# Patient Record
Sex: Male | Born: 1980 | Race: White | Hispanic: No | Marital: Married | Smoking: Former smoker
Health system: Southern US, Community
[De-identification: ages and names within clinical notes are randomized; demographics above are authoritative.]

## PROBLEM LIST (undated history)

## (undated) DIAGNOSIS — F32A Depression, unspecified: Secondary | ICD-10-CM

## (undated) DIAGNOSIS — F329 Major depressive disorder, single episode, unspecified: Secondary | ICD-10-CM

## (undated) DIAGNOSIS — F1010 Alcohol abuse, uncomplicated: Secondary | ICD-10-CM

## (undated) DIAGNOSIS — F319 Bipolar disorder, unspecified: Secondary | ICD-10-CM

## (undated) DIAGNOSIS — F419 Anxiety disorder, unspecified: Secondary | ICD-10-CM

---

## 2007-10-27 ENCOUNTER — Emergency Department (HOSPITAL_COMMUNITY): Admission: EM | Admit: 2007-10-27 | Discharge: 2007-10-27 | Payer: Self-pay | Admitting: Family Medicine

## 2009-11-11 ENCOUNTER — Emergency Department (HOSPITAL_COMMUNITY): Admission: EM | Admit: 2009-11-11 | Discharge: 2009-11-11 | Payer: Self-pay | Admitting: Family Medicine

## 2010-12-23 ENCOUNTER — Inpatient Hospital Stay (INDEPENDENT_AMBULATORY_CARE_PROVIDER_SITE_OTHER)
Admission: RE | Admit: 2010-12-23 | Discharge: 2010-12-23 | Disposition: A | Discharge status: Home / Self Care | Payer: Self-pay | Source: Ambulatory Visit | Attending: Family Medicine | Admitting: Family Medicine

## 2010-12-23 DIAGNOSIS — J309 Allergic rhinitis, unspecified: Secondary | ICD-10-CM

## 2010-12-23 DIAGNOSIS — J45909 Unspecified asthma, uncomplicated: Secondary | ICD-10-CM

## 2012-10-31 ENCOUNTER — Emergency Department (INDEPENDENT_AMBULATORY_CARE_PROVIDER_SITE_OTHER)
Admission: EM | Admit: 2012-10-31 | Discharge: 2012-10-31 | Disposition: A | Discharge status: Home / Self Care | Payer: Commercial Managed Care - PPO | Source: Home / Self Care | Attending: Emergency Medicine | Admitting: Emergency Medicine

## 2012-10-31 ENCOUNTER — Encounter (HOSPITAL_COMMUNITY): Payer: Self-pay | Admitting: Emergency Medicine

## 2012-10-31 DIAGNOSIS — Z23 Encounter for immunization: Secondary | ICD-10-CM

## 2012-10-31 DIAGNOSIS — S81811A Laceration without foreign body, right lower leg, initial encounter: Secondary | ICD-10-CM

## 2012-10-31 DIAGNOSIS — S81009A Unspecified open wound, unspecified knee, initial encounter: Secondary | ICD-10-CM

## 2012-10-31 HISTORY — DX: Anxiety disorder, unspecified: F41.9

## 2012-10-31 MED ORDER — SULFAMETHOXAZOLE-TRIMETHOPRIM 800-160 MG PO TABS: 1.0000 | ORAL_TABLET | Freq: Two times a day (BID) | ORAL | Status: DC

## 2012-10-31 MED ORDER — TETANUS-DIPHTH-ACELL PERTUSSIS 5-2.5-18.5 LF-MCG/0.5 IM SUSP
INTRAMUSCULAR | Status: AC
  Filled 2012-10-31: qty 0.5

## 2012-10-31 MED ORDER — IBUPROFEN 800 MG PO TABS: 800.0000 mg | ORAL_TABLET | Freq: Three times a day (TID) | ORAL | Status: DC

## 2012-10-31 MED ORDER — TETANUS-DIPHTH-ACELL PERTUSSIS 5-2.5-18.5 LF-MCG/0.5 IM SUSP
0.5000 mL | Freq: Once | INTRAMUSCULAR | Status: AC
  Administered 2012-10-31: 0.5 mL via INTRAMUSCULAR

## 2012-10-31 NOTE — ED Provider Notes (Signed)
History     CSN: [id number]  Arrival date & time 10/31/12  1015   First MD Initiated Contact with Patient 10/31/12 1054      Chief Complaint  Patient presents with  . Laceration    (Consider location/radiation/quality/duration/timing/severity/associated sxs/prior treatment) Patient is a 32 y.o. male presenting with skin laceration. The history is provided by the patient. No language interpreter was used.  Laceration Location:  Leg Leg laceration location:  L lower leg Length (cm):  3 Depth:  Through underlying tissue Quality: straight   Bleeding: controlled   Time since incident:  13 hours Laceration mechanism:  Broken glass Pain details:    Quality:  Sharp   Severity:  Moderate Foreign body present:  No foreign bodies Relieved by:  Nothing Ineffective treatments:  None tried Pt complains of cutting his leg with a piece of glass  Past Medical History  Diagnosis Date  . Anxiety     History reviewed. No pertinent past surgical history.  No family history on file.  History  Substance Use Topics  . Smoking status: Never Smoker   . Smokeless tobacco: Not on file  . Alcohol Use: Yes      Review of Systems  Skin: Positive for wound.  All other systems reviewed and are negative.    Allergies  Shellfish allergy  Home Medications   Current Outpatient Rx  Name  Route  Sig  Dispense  Refill  . ALPRAZolam (XANAX) 1 MG tablet   Oral   Take 1 mg by mouth as needed for anxiety.         Marland Kitchen PARoxetine (PAXIL) 20 MG tablet   Oral   Take 20 mg by mouth every morning.           BP 151/82  Pulse 82  Temp(Src) 98.1 F (36.7 C) (Oral)  SpO2 100%  Physical Exam  Nursing note and vitals reviewed. Constitutional: He is oriented to person, place, and time. He appears well-developed and well-nourished.  HENT:  Head: Normocephalic.  Eyes: Pupils are equal, round, and reactive to light.  Neck: Normal range of motion.  Cardiovascular: Normal rate.    Pulmonary/Chest: Effort normal.  Musculoskeletal: He exhibits tenderness.  Neurological: He is alert and oriented to person, place, and time.  Skin: Skin is warm.  3cm laceration right lower leg  Psychiatric: He has a normal mood and affect.    ED Course  LACERATION REPAIR Date/Time: 10/31/2012 11:00 AM Performed by: Elson Areas Authorized by: Leslee Home C Consent: Verbal consent obtained. Risks and benefits: risks, benefits and alternatives were discussed Consent given by: patient Patient identity confirmed: verbally with patient Body area: lower extremity Location details: right lower leg Laceration length: 3 cm Foreign bodies: glass Anesthesia: local infiltration Preparation: Patient was prepped and draped in the usual sterile fashion. Irrigation solution: saline Amount of cleaning: standard Skin closure: 3-0 Prolene Number of sutures: 6 Technique: simple Approximation: loose Approximation difficulty: simple Patient tolerance: Patient tolerated the procedure well with no immediate complications.   (including critical care time)  Labs Reviewed - No data to display No results found.   No diagnosis found.    MDM    Suture removal in 10 days  Bactrim ds,  ibuprofen     Lonia Skinner [place], [place] 10/31/12 1101

## 2012-10-31 NOTE — ED Notes (Signed)
Reports cut to right lower leg last night.  Cut with glass last night while taking trash out.  Bleeding controlled.

## 2012-10-31 NOTE — ED Provider Notes (Signed)
Medical screening examination/treatment/procedure(s) were performed by non-physician practitioner and as supervising physician I was immediately available for consultation/collaboration.  David Keller, M.D.  David C Keller, MD 10/31/12 2136 

## 2012-10-31 NOTE — ED Notes (Signed)
Patient name is incorrect, clarified correction with registration.

## 2012-11-10 ENCOUNTER — Encounter (HOSPITAL_COMMUNITY): Payer: Self-pay | Admitting: Emergency Medicine

## 2012-11-10 ENCOUNTER — Emergency Department (INDEPENDENT_AMBULATORY_CARE_PROVIDER_SITE_OTHER)
Admission: EM | Admit: 2012-11-10 | Discharge: 2012-11-10 | Disposition: A | Discharge status: Home / Self Care | Payer: Commercial Managed Care - PPO | Source: Home / Self Care

## 2012-11-10 DIAGNOSIS — Z4802 Encounter for removal of sutures: Secondary | ICD-10-CM

## 2012-11-10 NOTE — ED Notes (Signed)
Onalee Hua, np reported patient was a little lightheaded.  Onalee Hua np laid patient flat.  Gave patient coke and ice to drink, allowed patient to rest for a few minutes.  Patient then reported feeling much better.  Patient finished coke, rechecked vitals

## 2012-11-10 NOTE — ED Provider Notes (Signed)
Medical screening examination/treatment/procedure(s) were performed by non-physician practitioner and as supervising physician I was immediately available for consultation/collaboration.  Leslee Home, M.D.  Reuben Likes, MD 11/10/12 [phone]

## 2012-11-10 NOTE — ED Provider Notes (Signed)
History     CSN: [id number]  Arrival date & time 11/10/12  1013   None     Chief Complaint  Patient presents with  . Suture / Staple Removal    (Consider location/radiation/quality/duration/timing/severity/associated sxs/prior treatment) HPI Comments: 32 year old male received a laceration to the right lower lateral leg 10 days ago. He presented to the urgent care center and the wound was treated and sutured. He is here for suture removal. He has no complaints.   Past Medical History  Diagnosis Date  . Anxiety     History reviewed. No pertinent past surgical history.  No family history on file.  History  Substance Use Topics  . Smoking status: Never Smoker   . Smokeless tobacco: Not on file  . Alcohol Use: Yes      Review of Systems  All other systems reviewed and are negative.    Allergies  Shellfish allergy  Home Medications   Current Outpatient Rx  Name  Route  Sig  Dispense  Refill  . ALPRAZolam (XANAX) 1 MG tablet   Oral   Take 1 mg by mouth as needed for anxiety.         Marland Kitchen ibuprofen (ADVIL,MOTRIN) 800 MG tablet   Oral   Take 1 tablet (800 mg total) by mouth 3 (three) times daily.   21 tablet   0   . PARoxetine (PAXIL) 20 MG tablet   Oral   Take 20 mg by mouth every morning.         . sulfamethoxazole-trimethoprim (SEPTRA DS) 800-160 MG per tablet   Oral   Take 1 tablet by mouth 2 (two) times daily.   28 tablet   0     BP 135/94  Pulse 65  Temp(Src) 98.1 F (36.7 C) (Oral)  Resp 18  SpO2 100%  Physical Exam  Constitutional: He is oriented to person, place, and time. He appears well-developed and well-nourished. No distress.  Neurological: He is alert and oriented to person, place, and time. He exhibits normal muscle tone.  Skin: Skin is warm and dry. No erythema.  The sutured wound is healing quite nicely. Edges are well approximated and there are no signs of infection. No swelling, redness or drainage.  Psychiatric: He has a  normal mood and affect.    ED Course  Procedures (including critical care time)  Labs Reviewed - No data to display No results found.   1. Visit for suture removal       MDM  All sutures were removed. Instructions for care after suture removal given. Do not apply ointments. For any problems or new symptoms develop may return.   Hayden Rasmussen, NP 11/10/12 1056

## 2012-11-10 NOTE — ED Notes (Signed)
Suture removal , right lower leg.  Sutures placed by ucc

## 2014-05-20 ENCOUNTER — Encounter (HOSPITAL_COMMUNITY): Payer: Self-pay | Admitting: Emergency Medicine

## 2014-05-20 ENCOUNTER — Inpatient Hospital Stay (HOSPITAL_COMMUNITY): Payer: Commercial Managed Care - PPO

## 2014-05-20 ENCOUNTER — Inpatient Hospital Stay (HOSPITAL_COMMUNITY)
Admission: EM | Admit: 2014-05-20 | Discharge: 2014-05-23 | DRG: 897 | Disposition: A | Discharge status: Home / Self Care | Payer: Commercial Managed Care - PPO | Attending: Internal Medicine | Admitting: Internal Medicine

## 2014-05-20 DIAGNOSIS — E872 Acidosis, unspecified: Secondary | ICD-10-CM | POA: Diagnosis not present

## 2014-05-20 DIAGNOSIS — R7989 Other specified abnormal findings of blood chemistry: Secondary | ICD-10-CM | POA: Diagnosis present

## 2014-05-20 DIAGNOSIS — F10239 Alcohol dependence with withdrawal, unspecified: Principal | ICD-10-CM | POA: Diagnosis present

## 2014-05-20 DIAGNOSIS — F319 Bipolar disorder, unspecified: Secondary | ICD-10-CM | POA: Diagnosis present

## 2014-05-20 DIAGNOSIS — R1115 Cyclical vomiting syndrome unrelated to migraine: Secondary | ICD-10-CM | POA: Diagnosis present

## 2014-05-20 DIAGNOSIS — D61818 Other pancytopenia: Secondary | ICD-10-CM

## 2014-05-20 DIAGNOSIS — F1020 Alcohol dependence, uncomplicated: Secondary | ICD-10-CM | POA: Diagnosis present

## 2014-05-20 DIAGNOSIS — F10939 Alcohol use, unspecified with withdrawal, unspecified: Secondary | ICD-10-CM | POA: Diagnosis present

## 2014-05-20 DIAGNOSIS — E876 Hypokalemia: Secondary | ICD-10-CM | POA: Diagnosis present

## 2014-05-20 DIAGNOSIS — D45 Polycythemia vera: Secondary | ICD-10-CM | POA: Diagnosis present

## 2014-05-20 DIAGNOSIS — K219 Gastro-esophageal reflux disease without esophagitis: Secondary | ICD-10-CM | POA: Diagnosis present

## 2014-05-20 DIAGNOSIS — E861 Hypovolemia: Secondary | ICD-10-CM | POA: Diagnosis present

## 2014-05-20 DIAGNOSIS — I498 Other specified cardiac arrhythmias: Secondary | ICD-10-CM | POA: Diagnosis present

## 2014-05-20 DIAGNOSIS — R197 Diarrhea, unspecified: Secondary | ICD-10-CM | POA: Diagnosis present

## 2014-05-20 DIAGNOSIS — E8729 Other acidosis: Secondary | ICD-10-CM | POA: Diagnosis present

## 2014-05-20 DIAGNOSIS — F10230 Alcohol dependence with withdrawal, uncomplicated: Secondary | ICD-10-CM

## 2014-05-20 DIAGNOSIS — F10930 Alcohol use, unspecified with withdrawal, uncomplicated: Secondary | ICD-10-CM

## 2014-05-20 DIAGNOSIS — E860 Dehydration: Secondary | ICD-10-CM | POA: Diagnosis present

## 2014-05-20 DIAGNOSIS — F1010 Alcohol abuse, uncomplicated: Secondary | ICD-10-CM | POA: Diagnosis present

## 2014-05-20 DIAGNOSIS — R112 Nausea with vomiting, unspecified: Secondary | ICD-10-CM | POA: Diagnosis present

## 2014-05-20 HISTORY — DX: Bipolar disorder, unspecified: F31.9

## 2014-05-20 HISTORY — DX: Alcohol abuse, uncomplicated: F10.10

## 2014-05-20 LAB — COMPREHENSIVE METABOLIC PANEL
ALK PHOS: 57 U/L (ref 39–117)
ALT: 68 U/L — ABNORMAL HIGH (ref 0–53)
ANION GAP: 37 — AB (ref 5–15)
AST: 115 U/L — AB (ref 0–37)
Albumin: 4.6 g/dL (ref 3.5–5.2)
BILIRUBIN TOTAL: 1.5 mg/dL — AB (ref 0.3–1.2)
BUN: 14 mg/dL (ref 6–23)
CHLORIDE: 92 meq/L — AB (ref 96–112)
CO2: 8 meq/L — AB (ref 19–32)
CREATININE: 1 mg/dL (ref 0.50–1.35)
Calcium: 8.5 mg/dL (ref 8.4–10.5)
GFR calc Af Amer: >90 mL/min (ref >90)
Glucose, Bld: 113 mg/dL — ABNORMAL HIGH (ref 70–99)
Potassium: 5.1 mEq/L (ref 3.7–5.3)
Sodium: 137 mEq/L (ref 137–147)
Total Protein: 7.8 g/dL (ref 6.0–8.3)

## 2014-05-20 LAB — CBC WITH DIFFERENTIAL/PLATELET
BASOS ABS: 0 10*3/uL (ref 0.0–0.1)
BASOS PCT: 0 % (ref 0–1)
EOS PCT: 0 % (ref 0–5)
Eosinophils Absolute: 0 10*3/uL (ref 0.0–0.7)
HCT: 49.1 % (ref 39.0–52.0)
Hemoglobin: 17.5 g/dL — ABNORMAL HIGH (ref 13.0–17.0)
LYMPHS PCT: 6 % — AB (ref 12–46)
Lymphs Abs: 0.5 10*3/uL — ABNORMAL LOW (ref 0.7–4.0)
MCH: 32.6 pg (ref 26.0–34.0)
MCHC: 35.6 g/dL (ref 30.0–36.0)
MCV: 91.6 fL (ref 78.0–100.0)
Monocytes Absolute: 0.5 10*3/uL (ref 0.1–1.0)
Monocytes Relative: 6 % (ref 3–12)
Neutro Abs: 8.3 10*3/uL — ABNORMAL HIGH (ref 1.7–7.7)
Neutrophils Relative %: 88 % — ABNORMAL HIGH (ref 43–77)
PLATELETS: 240 10*3/uL (ref 150–400)
RBC: 5.36 MIL/uL (ref 4.22–5.81)
RDW: 13.1 % (ref 11.5–15.5)
WBC: 9.3 10*3/uL (ref 4.0–10.5)

## 2014-05-20 LAB — I-STAT ARTERIAL BLOOD GAS, ED
Acid-base deficit: 17 mmol/L — ABNORMAL HIGH (ref 0.0–2.0)
BICARBONATE: 9.4 meq/L — AB (ref 20.0–24.0)
O2 Saturation: 95 %
PCO2 ART: 24 mmHg — AB (ref 35.0–45.0)
PH ART: 7.2 — AB (ref 7.350–7.450)
PO2 ART: 93 mmHg (ref 80.0–100.0)
TCO2: 10 mmol/L (ref 0–100)

## 2014-05-20 LAB — ETHANOL: ALCOHOL ETHYL (B): 178 mg/dL — AB (ref 0–11)

## 2014-05-20 LAB — AMMONIA: Ammonia: 29 umol/L (ref 11–60)

## 2014-05-20 LAB — RAPID URINE DRUG SCREEN, HOSP PERFORMED
AMPHETAMINES: NOT DETECTED
BENZODIAZEPINES: NOT DETECTED
Barbiturates: NOT DETECTED
COCAINE: NOT DETECTED
OPIATES: NOT DETECTED
TETRAHYDROCANNABINOL: NOT DETECTED

## 2014-05-20 LAB — LIPASE, BLOOD: Lipase: 67 U/L — ABNORMAL HIGH (ref 11–59)

## 2014-05-20 LAB — LACTIC ACID, PLASMA: Lactic Acid, Venous: 7.7 mmol/L — ABNORMAL HIGH (ref 0.5–2.2)

## 2014-05-20 LAB — KETONES, QUALITATIVE: Acetone, Bld: NEGATIVE

## 2014-05-20 LAB — SALICYLATE LEVEL: Salicylate Lvl: <2 mg/dL — ABNORMAL LOW (ref 2.8–20.0)

## 2014-05-20 LAB — MRSA PCR SCREENING: MRSA BY PCR: NEGATIVE

## 2014-05-20 LAB — ACETAMINOPHEN LEVEL: Acetaminophen (Tylenol), Serum: <15 ug/mL (ref 10–30)

## 2014-05-20 MED ORDER — LORAZEPAM 2 MG/ML IJ SOLN
2.0000 mg | INTRAMUSCULAR | Status: DC | PRN
  Administered 2014-05-20 (×2): 2 mg via INTRAVENOUS
  Administered 2014-05-20: 3 mg via INTRAVENOUS
  Administered 2014-05-20: 2 mg via INTRAVENOUS
  Administered 2014-05-21: 3 mg via INTRAVENOUS
  Filled 2014-05-20 (×2): qty 1
  Filled 2014-05-20: qty 2
  Filled 2014-05-20 (×2): qty 1
  Filled 2014-05-20: qty 2

## 2014-05-20 MED ORDER — VITAMIN B-1 100 MG PO TABS
100.0000 mg | ORAL_TABLET | Freq: Every day | ORAL | Status: DC
  Administered 2014-05-21 – 2014-05-23 (×3): 100 mg via ORAL
  Filled 2014-05-20 (×3): qty 1

## 2014-05-20 MED ORDER — PROMETHAZINE HCL 25 MG/ML IJ SOLN
25.0000 mg | Freq: Once | INTRAMUSCULAR | Status: AC
  Administered 2014-05-20: 25 mg via INTRAVENOUS
  Filled 2014-05-20: qty 1

## 2014-05-20 MED ORDER — ALUM & MAG HYDROXIDE-SIMETH 200-200-20 MG/5ML PO SUSP: 30.0000 mL | Freq: Four times a day (QID) | ORAL | Status: DC | PRN

## 2014-05-20 MED ORDER — SODIUM CHLORIDE 0.9 % IV BOLUS (SEPSIS)
1000.0000 mL | Freq: Once | INTRAVENOUS | Status: AC
  Administered 2014-05-20: 1000 mL via INTRAVENOUS

## 2014-05-20 MED ORDER — SODIUM CHLORIDE 0.9 % IV SOLN
INTRAVENOUS | Status: DC
  Administered 2014-05-20 – 2014-05-22 (×5): via INTRAVENOUS

## 2014-05-20 MED ORDER — FOLIC ACID 1 MG PO TABS
1.0000 mg | ORAL_TABLET | Freq: Every day | ORAL | Status: DC
  Administered 2014-05-21 – 2014-05-23 (×3): 1 mg via ORAL
  Filled 2014-05-20 (×3): qty 1

## 2014-05-20 MED ORDER — THIAMINE HCL 100 MG/ML IJ SOLN
Freq: Once | INTRAVENOUS | Status: AC
  Administered 2014-05-20: 15:00:00 via INTRAVENOUS
  Filled 2014-05-20: qty 1000

## 2014-05-20 MED ORDER — ONDANSETRON HCL 4 MG/2ML IJ SOLN
4.0000 mg | Freq: Once | INTRAMUSCULAR | Status: AC
  Administered 2014-05-20: 4 mg via INTRAVENOUS
  Filled 2014-05-20: qty 2

## 2014-05-20 MED ORDER — HEPARIN SODIUM (PORCINE) 5000 UNIT/ML IJ SOLN
5000.0000 [IU] | Freq: Three times a day (TID) | INTRAMUSCULAR | Status: DC
  Administered 2014-05-20 – 2014-05-23 (×8): 5000 [IU] via SUBCUTANEOUS
  Filled 2014-05-20 (×10): qty 1

## 2014-05-20 MED ORDER — ONDANSETRON HCL 4 MG/2ML IJ SOLN
4.0000 mg | Freq: Four times a day (QID) | INTRAMUSCULAR | Status: DC
  Administered 2014-05-20 – 2014-05-23 (×11): 4 mg via INTRAVENOUS
  Filled 2014-05-20 (×11): qty 2

## 2014-05-20 MED ORDER — ADULT MULTIVITAMIN W/MINERALS CH
1.0000 | ORAL_TABLET | Freq: Every day | ORAL | Status: DC
  Administered 2014-05-21 – 2014-05-23 (×3): 1 via ORAL
  Filled 2014-05-20 (×3): qty 1

## 2014-05-20 MED ORDER — HALOPERIDOL LACTATE 5 MG/ML IJ SOLN
5.0000 mg | Freq: Once | INTRAMUSCULAR | Status: AC
  Administered 2014-05-20: 5 mg via INTRAVENOUS

## 2014-05-20 MED ORDER — BISACODYL 10 MG RE SUPP: 10.0000 mg | Freq: Every day | RECTAL | Status: DC | PRN

## 2014-05-20 MED ORDER — SODIUM CHLORIDE 0.9 % IV SOLN: INTRAVENOUS | Status: AC

## 2014-05-20 MED ORDER — PAROXETINE HCL 20 MG PO TABS
20.0000 mg | ORAL_TABLET | Freq: Every day | ORAL | Status: DC
  Administered 2014-05-21 – 2014-05-23 (×3): 20 mg via ORAL
  Filled 2014-05-20 (×4): qty 1

## 2014-05-20 MED ORDER — HALOPERIDOL LACTATE 5 MG/ML IJ SOLN
INTRAMUSCULAR | Status: AC
  Administered 2014-05-20: 5 mg via INTRAVENOUS
  Filled 2014-05-20: qty 1

## 2014-05-20 MED ORDER — LORAZEPAM 2 MG/ML IJ SOLN
1.0000 mg | Freq: Once | INTRAMUSCULAR | Status: AC
Start: 2014-05-20 — End: 2014-05-20
  Administered 2014-05-20: 1 mg via INTRAVENOUS

## 2014-05-20 MED ORDER — MORPHINE SULFATE 2 MG/ML IJ SOLN: 2.0000 mg | INTRAMUSCULAR | Status: DC | PRN

## 2014-05-20 MED ORDER — PROMETHAZINE HCL 25 MG/ML IJ SOLN
12.5000 mg | Freq: Four times a day (QID) | INTRAMUSCULAR | Status: DC | PRN
  Administered 2014-05-20 (×2): 12.5 mg via INTRAVENOUS
  Filled 2014-05-20 (×3): qty 1

## 2014-05-20 NOTE — Progress Notes (Addendum)
Pt was found wondering out of room with belongings in hand. Pt had taken out IV and took off monitor. Pt escorted back to room and put back in bed. Pt has a hx of ETOH abuse and CIWA was 17. Medication given and nurse tech at bedside at this time for pt's safety. Will continue to assess and give medication as needed.

## 2014-05-20 NOTE — ED Notes (Signed)
RT at the bedside.

## 2014-05-20 NOTE — ED Notes (Signed)
Provider at the bedside.  

## 2014-05-20 NOTE — H&P (Signed)
Patient Demographics  Chad Mclaughlin, is a 33 y.o. male  MRN: [id number]   DOB - 1981/01/11  Admit Date - 05/20/2014  Outpatient Primary MD for the patient is No primary provider on file.   With History of -  Past Medical History  Diagnosis Date  . Anxiety       History reviewed. No pertinent past surgical history.  in for   Chief Complaint  Patient presents with  . Emesis     HPI  Chad Mclaughlin  is a 33 y.o. male, presents with intractable nausea and vomiting, patient reports a history of heavy alcohol abuse, he drinks fifth of Vodka every 3 days, but reports he had a long weekend where he was drinking one fifth per day, wife at bedside reports he hasn't been eating or drinking much over the last 3 days, then nausea and vomiting since yesterday night, which prompted him to come to ED, she denies any using any drug, no Tylenol, no aspirin, no BC powder, no use of any mouth rinse or any antifreeze,Reports he has just been drinking vodka. Upon presentation to ED patient had significant nausea and vomiting, sinus tachycardia in the 140s, clinical dehydration,, ABG showing metabolic acidosis with pH of 7.2, PCO2 of 24, bicarbonate of 8, anion gap of 37, heart rate improved with IV fluids, it seemed like old, ethanol, methanol, salicylate, acetaminophen level, were sent and still pending. Hospitalist service requested to admit the patient.   Review of Systems    In addition to the HPI above,  No Fever-chills, No Headache, No changes with Vision or hearing, No problems swallowing food or Liquids, No Chest pain, Cough or Shortness of Breath, No Abdominal pain, complaints of Nausea or Vommitting, Bowel movements are regular, No Blood in stool or Urine, No dysuria, No new skin rashes or bruises, No new joints pains-aches,  No new weakness, tingling, numbness in any extremity, No recent weight gain or loss, No polyuria, polydypsia or polyphagia, No significant Mental  Stressors.  A full 10 point Review of Systems was done, except as stated above, all other Review of Systems were negative.   Social History History  Substance Use Topics  . Smoking status: Never Smoker   . Smokeless tobacco: Not on file  . Alcohol Use: Yes     Family History No family history on file.   Prior to Admission medications   Medication Sig Start Date End Date Taking? Authorizing Provider  ALPRAZolam Duanne Moron) 1 MG tablet Take 1 mg by mouth 2 (two) times daily as needed for anxiety.    Yes Historical Provider, MD  PARoxetine (PAXIL) 20 MG tablet Take 20 mg by mouth every morning.   Yes Historical Provider, MD    Allergies  Allergen Reactions  . Shellfish Allergy Anaphylaxis    Physical Exam  Vitals  Blood pressure 114/67, pulse 116, temperature 98 F (36.7 C), temperature source Oral, resp. rate 17, SpO2 93.00%.   1. General Young pleasant male lying in bed in NAD,   2. Normal affect and insight, Not Suicidal or Homicidal, Awake Alert, Oriented X 3.  3. No F.N deficits, ALL C.Nerves Intact, Strength 5/5 all 4 extremities, Sensation intact all 4 extremities, Plantars down going.  4. Ears and Eyes appear Normal, Conjunctivae clear, PERRLA. Moist Oral Mucosa.  5. Supple Neck, No JVD, No cervical lymphadenopathy appriciated, No Carotid Bruits.  6. Symmetrical Chest wall movement, Good air movement bilaterally, CTAB.  7. regular but tachycardic, No Gallops,  Rubs or Murmurs, No Parasternal Heave.  8. Positive Bowel Sounds, Abdomen Soft, No tenderness, No organomegaly appriciated,No rebound -guarding or rigidity.  9.  No Cyanosis, delayed Skin Turgor, No Skin Rash or Bruise.  10. Good muscle tone,  joints appear normal , no effusions, Normal ROM.  11. No Palpable Lymph Nodes in Neck or Axillae    Data Review  CBC  Recent Labs Lab 05/20/14 0805  WBC 9.3  HGB 17.5*  HCT 49.1  PLT 240  MCV 91.6  MCH 32.6  MCHC 35.6  RDW 13.1  LYMPHSABS 0.5*   MONOABS 0.5  EOSABS 0.0  BASOSABS 0.0   ------------------------------------------------------------------------------------------------------------------  Chemistries   Recent Labs Lab 05/20/14 0805  NA 137  K 5.1  CL 92*  CO2 8*  GLUCOSE 113*  BUN 14  CREATININE 1.00  CALCIUM 8.5  AST 115*  ALT 68*  ALKPHOS 57  BILITOT 1.5*   ------------------------------------------------------------------------------------------------------------------ CrCl is unknown because there is no height on file for the current visit. ------------------------------------------------------------------------------------------------------------------ No results found for this basename: TSH, T4TOTAL, FREET3, T3FREE, THYROIDAB,  in the last 72 hours   Coagulation profile No results found for this basename: INR, PROTIME,  in the last 168 hours ------------------------------------------------------------------------------------------------------------------- No results found for this basename: DDIMER,  in the last 72 hours -------------------------------------------------------------------------------------------------------------------  Cardiac Enzymes No results found for this basename: CK, CKMB, TROPONINI, MYOGLOBIN,  in the last 168 hours ------------------------------------------------------------------------------------------------------------------ No components found with this basename: POCBNP,    ---------------------------------------------------------------------------------------------------------------  Urinalysis No results found for this basename: colorurine, appearanceur, labspec, phurine, glucoseu, hgbur, bilirubinur, ketonesur, proteinur, urobilinogen, nitrite, leukocytesur    ----------------------------------------------------------------------------------------------------------------  Imaging results:   No results found.      Assessment & Plan  Active  Problems:   Alcoholic ketoacidosis   Nausea and vomiting   Alcohol abuse    1. alcoholic ketoacidosis: Patient presents with heavy alcohol use recently, poor oral intake, significant nausea and vomiting, as metabolic acidosis with elevated anion gap, most likely source is alcoholic ketoacidosis, will admit to step down, any with aggressive IV hydration, check magnesium and phosphorus level in a.m., replace as needed, once his nausea and vomiting resolved, start him back on diet, will follow on rest of his panel including if he glycol, midsternal, salicylate, acetaminophen, even though wife and patient denies using any of those substances.  2. nausea and vomiting: Patient will be kept n.p.o., Keep on when necessary nausea and pain medicine. Mildly elevated lipase level, but no abdominal pain on physical exam.  3. Alcohol abuse: Patient was counseled that, will be started on CIWA protocol.  4. Elevated LFT: Patient has significant elevation in his AST, which is very typical for alcohol abuse.    DVT Prophylaxis Heparin , SCDs   AM Labs Ordered, also please review Full Orders  Family Communication: Admission, patients condition and plan of care including tests being ordered have been discussed with the patient and wife who indicate understanding and agree with the plan and Code Status.  Code Status full  Likely DC to  home  Condition GUARDED    Time spent in minutes : 55 minutes.    [facility], Mabell Esguerra M.D on 05/20/2014 at 10:36 AM  Between 7am to 7pm - Pager - [phone]  After 7pm go to [email or web address] - password [email or web address]  And look for the night coverage person covering me after hours  [facility] Office  [phone]   **Disclaimer: This note may have been dictated with voice recognition software. Similar sounding  words can inadvertently be transcribed and this note may contain transcription errors which may not have been corrected upon publication of note.**

## 2014-05-20 NOTE — ED Notes (Addendum)
Vomiting all night  abd pain states that he has had too much etoh since last monday  Denies SI or HI

## 2014-05-20 NOTE — ED Notes (Signed)
Attempted report 

## 2014-05-20 NOTE — Progress Notes (Signed)
Utilization Review Completed.Donne Anon T9/21/2015

## 2014-05-20 NOTE — Progress Notes (Signed)
Pt is increasingly becoming more agitated. Pt is pulling at leads and IV line and is trying to get out of bed. Sitter is at bedside and anti anxiety medication given. MD called and orders for stronger med given. Will continue to monitor.

## 2014-05-20 NOTE — ED Provider Notes (Signed)
CSN: [id number]     Arrival date & time 05/20/14  5681 History   First MD Initiated Contact with Patient 05/20/14 [phone]     Chief Complaint  Patient presents with  . Emesis     (Consider location/radiation/quality/duration/timing/severity/associated sxs/prior Treatment) HPI  Patient to the ER with complaints of nausea, vomiting, and "not feeling well". 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 This week has been particularly challenging leading him to drink 1 bottle of alcohol (1/5th) each day for the past 3 days. He reports that he does not drink often. He is now having vomiting every 20 minutes since then and is feeling poorly. He had a pulse of 140 at arrival but is now tachy around 102.  Past Medical History  Diagnosis Date  . Anxiety    History reviewed. No pertinent past surgical history. No family history on file. History  Substance Use Topics  . Smoking status: Never Smoker   . Smokeless tobacco: Not on file  . Alcohol Use: Yes    Review of Systems   Review of Systems  Gen: no weight loss, fevers, chills, night sweats  Eyes: no occular draining, occular pain,  No visual changes  Nose: no epistaxis or rhinorrhea  Mouth: no dental pain, no sore throat  Neck: no neck pain  Lungs: No hemoptysis. No wheezing or coughing CV:  No palpitations, dependent edema or orthopnea. No chest pain Abd: no diarrhea. No nausea or vomiting, No abdominal pain  GU: no dysuria or gross hematuria  MSK:  No muscle weakness, No muscular pain Neuro: no headache, no focal neurologic deficits  Skin: no rash , no wounds Psyche: no complaints of depression or anxiety    Allergies  Shellfish allergy  Home Medications   Prior to Admission medications   Medication Sig Start Date End Date Taking? Authorizing Provider  ALPRAZolam Duanne Moron) 1 MG tablet Take 1 mg by mouth 2 (two) times daily as needed for anxiety.    Yes Historical Provider, MD  PARoxetine  (PAXIL) 20 MG tablet Take 20 mg by mouth every morning.   Yes Historical Provider, MD   BP 130/85  Pulse 101  Temp(Src) 98 F (36.7 C) (Oral)  Resp 24  SpO2 96% Physical Exam  Nursing note and vitals reviewed. Constitutional: He is oriented to person, place, and time. He appears well-developed and well-nourished. No distress.  HENT:  Head: Normocephalic and atraumatic.  Eyes: Pupils are equal, round, and reactive to light.  Neck: Normal range of motion. Neck supple.  Cardiovascular: Regular rhythm.  Tachycardia present.   Pulmonary/Chest: Effort normal.  Abdominal: Soft. Bowel sounds are increased.  Vomiting during exam  Neurological: He is alert and oriented to person, place, and time.  Skin: Skin is warm. He is diaphoretic.    ED Course  Procedures (including critical care time) Labs Review Labs Reviewed  LIPASE, BLOOD - Abnormal; Notable for the following:    Lipase 67 (*)    All other components within normal limits  COMPREHENSIVE METABOLIC PANEL - Abnormal; Notable for the following:    Chloride 92 (*)    CO2 8 (*)    Glucose, Bld 113 (*)    AST 115 (*)    ALT 68 (*)    Total Bilirubin 1.5 (*)    Anion gap 37 (*)    All other components within normal limits  CBC WITH DIFFERENTIAL - Abnormal; Notable for the following:    Hemoglobin 17.5 (*)  Neutrophils Relative % 88 (*)    Neutro Abs 8.3 (*)    Lymphocytes Relative 6 (*)    Lymphs Abs 0.5 (*)    All other components within normal limits  I-STAT ARTERIAL BLOOD GAS, ED - Abnormal; Notable for the following:    pH, Arterial 7.200 (*)    pCO2 arterial 24.0 (*)    Bicarbonate 9.4 (*)    Acid-base deficit 17.0 (*)    All other components within normal limits  ETHANOL  SALICYLATE LEVEL  ACETAMINOPHEN LEVEL  ALCOHOL, METHYL (METHANOL), BLOOD  ALCOHOL,  ISOPROPYL (ISOPROPANOL)  AMMONIA  URINE RAPID DRUG SCREEN (HOSP PERFORMED)  ETHYLENE GLYCOL  KETONES, QUALITATIVE  LACTIC ACID, PLASMA    Imaging  Review No results found.   EKG Interpretation None      MDM   Final diagnoses:  Alcoholic ketoacidosis    Patient to the ER reoprting that he drank a significant amount of alcohol in the past couple of days which is abnormal for him. He denies ingesting any other substances aside from his normal daily medications (Paxil and Xanax) that his psychiatrist prescribes for him. He is adamant about this as well as family member present.  CRITICAL CARE Performed by: Linus Mako Total critical care time: 40 Critical care time was exclusive of separately billable procedures and treating other patients. Critical care was necessary to treat or prevent imminent or life-threatening deterioration. Critical care was time spent personally by me on the following activities: development of treatment plan with patient and/or surrogate as well as nursing, discussions with consultants, evaluation of patient's response to treatment, examination of patient, obtaining history from patient or surrogate, ordering and performing treatments and interventions, ordering and review of laboratory studies, ordering and review of radiographic studies, pulse oximetry and re-evaluation of patient's condition.  He has a significant acidosis with a gap. Dr. Ashok Cordia has seen him and is aware of abnormal findings. Two IV's have been ordered, one is already in place and he is receiving IV fluids.    PH is 7.2, bicarb is 9.4 on ABG and he has a anion gap of 37 on CMP.  I spoke with [facility] who request I add on a KUB, he has agreed to admit patient to step down. At this time it is assumed that this is alcohol related but potential other etiologies, toxicities are being considered and evaluated for.      Linus Mako, PA-C 05/20/14 1022

## 2014-05-20 NOTE — ED Notes (Signed)
Admitting MD at the bedside.  

## 2014-05-20 NOTE — ED Notes (Signed)
Pt to go upstairs when returning from x-ray.

## 2014-05-21 ENCOUNTER — Encounter (HOSPITAL_COMMUNITY): Payer: Self-pay | Admitting: Pulmonary Disease

## 2014-05-21 DIAGNOSIS — F1010 Alcohol abuse, uncomplicated: Secondary | ICD-10-CM

## 2014-05-21 DIAGNOSIS — R112 Nausea with vomiting, unspecified: Secondary | ICD-10-CM

## 2014-05-21 DIAGNOSIS — E872 Acidosis, unspecified: Secondary | ICD-10-CM

## 2014-05-21 LAB — MAGNESIUM: Magnesium: 2 mg/dL (ref 1.5–2.5)

## 2014-05-21 LAB — ETHYLENE GLYCOL: Ethylene Glycol Lvl: <5 mg/dL (ref <5)

## 2014-05-21 LAB — COMPREHENSIVE METABOLIC PANEL
ALBUMIN: 3.4 g/dL — AB (ref 3.5–5.2)
ALK PHOS: 35 U/L — AB (ref 39–117)
ALT: 45 U/L (ref 0–53)
AST: 73 U/L — AB (ref 0–37)
Anion gap: 18 — ABNORMAL HIGH (ref 5–15)
BILIRUBIN TOTAL: 1.8 mg/dL — AB (ref 0.3–1.2)
BUN: 13 mg/dL (ref 6–23)
CO2: 18 mEq/L — ABNORMAL LOW (ref 19–32)
Calcium: 8 mg/dL — ABNORMAL LOW (ref 8.4–10.5)
Chloride: 97 mEq/L (ref 96–112)
Creatinine, Ser: 0.8 mg/dL (ref 0.50–1.35)
GFR calc Af Amer: >90 mL/min (ref >90)
GFR calc non Af Amer: >90 mL/min (ref >90)
Glucose, Bld: 99 mg/dL (ref 70–99)
POTASSIUM: 3.9 meq/L (ref 3.7–5.3)
Sodium: 133 mEq/L — ABNORMAL LOW (ref 137–147)
Total Protein: 5.7 g/dL — ABNORMAL LOW (ref 6.0–8.3)

## 2014-05-21 LAB — CBC
HEMATOCRIT: 37.8 % — AB (ref 39.0–52.0)
Hemoglobin: 13.2 g/dL (ref 13.0–17.0)
MCH: 31.8 pg (ref 26.0–34.0)
MCHC: 34.9 g/dL (ref 30.0–36.0)
MCV: 91.1 fL (ref 78.0–100.0)
Platelets: 137 10*3/uL — ABNORMAL LOW (ref 150–400)
RBC: 4.15 MIL/uL — ABNORMAL LOW (ref 4.22–5.81)
RDW: 13 % (ref 11.5–15.5)
WBC: 3.3 10*3/uL — ABNORMAL LOW (ref 4.0–10.5)

## 2014-05-21 LAB — ALCOHOL,  ISOPROPYL (ISOPROPANOL)
Acetone: NEGATIVE
Isopropanol: NEGATIVE

## 2014-05-21 LAB — CLOSTRIDIUM DIFFICILE BY PCR: Toxigenic C. Difficile by PCR: NEGATIVE

## 2014-05-21 LAB — ALCOHOL, METHYL (METHANOL), BLOOD: METHANOL LVL: NEGATIVE

## 2014-05-21 LAB — PHOSPHORUS: Phosphorus: 1.3 mg/dL — ABNORMAL LOW (ref 2.3–4.6)

## 2014-05-21 MED ORDER — LORAZEPAM 2 MG/ML IJ SOLN
1.0000 mg | INTRAMUSCULAR | Status: DC | PRN
  Administered 2014-05-22: 2 mg via INTRAVENOUS
  Administered 2014-05-23: 1 mg via INTRAVENOUS
  Filled 2014-05-21 (×2): qty 1

## 2014-05-21 MED ORDER — HALOPERIDOL LACTATE 5 MG/ML IJ SOLN
5.0000 mg | Freq: Once | INTRAMUSCULAR | Status: AC
  Administered 2014-05-21: 5 mg via INTRAVENOUS
  Filled 2014-05-21: qty 1

## 2014-05-21 MED ORDER — CHLORDIAZEPOXIDE HCL 25 MG PO CAPS
50.0000 mg | ORAL_CAPSULE | Freq: Once | ORAL | Status: AC
  Administered 2014-05-21: 50 mg via ORAL
  Filled 2014-05-21: qty 2

## 2014-05-21 MED ORDER — PANTOPRAZOLE SODIUM 40 MG IV SOLR: 40.0000 mg | Freq: Two times a day (BID) | INTRAVENOUS | Status: DC

## 2014-05-21 MED ORDER — LORAZEPAM 2 MG/ML IJ SOLN
1.0000 mg | INTRAMUSCULAR | Status: DC
  Administered 2014-05-21 (×3): 1 mg via INTRAVENOUS
  Filled 2014-05-21 (×3): qty 1

## 2014-05-21 MED ORDER — SODIUM CHLORIDE 0.9 % IV SOLN
80.0000 mg | Freq: Once | INTRAVENOUS | Status: AC
  Administered 2014-05-21: 80 mg via INTRAVENOUS
  Filled 2014-05-21: qty 80

## 2014-05-21 MED ORDER — SODIUM CHLORIDE 0.9 % IV SOLN
8.0000 mg/h | INTRAVENOUS | Status: DC
  Administered 2014-05-21 (×2): 8 mg/h via INTRAVENOUS
  Filled 2014-05-21 (×5): qty 80

## 2014-05-21 NOTE — Consult Note (Signed)
PULMONARY / CRITICAL CARE MEDICINE   Name: Chad Mclaughlin MRN: [id number] DOB: August 31, 1980    ADMISSION DATE:  05/20/2014 CONSULTATION DATE:  05/21/2014  REFERRING MD : Verlon Au  CHIEF COMPLAINT:  Withdrawal  INITIAL PRESENTATION:  33 yo male with hx of ETOH found passed out in bathroom by wife.  PCCM asked to assess for management of ETOH withdrawal.  STUDIES:   SIGNIFICANT EVENTS: 9/21 Admit   HISTORY OF PRESENT ILLNESS:   Hx from pt's wife.    Pt drinks 5 bottles of vodka per week with his wife, but then also drinks more alcohol on his own.  He was having episodes of vomiting after drinking on 9/21.  Wife went to bed at 10 pm, and pt was in bathroom vomiting.  Wife found pt passed out on floor in bathroom.  As a result she had him brought to ER.  There is no hx of seizures.  Pt has hx of Bipolar and takes paxil and xanax.  He also has problems with reflux and uses OTC stomach medications.  No hx of smoking, illicit drug use, or drinking moonshine.  PAST MEDICAL HISTORY :  Past Medical History  Diagnosis Date  . Anxiety   . Bipolar affective disorder   . Alcohol abuse    History reviewed. No pertinent past surgical history.  Prior to Admission medications   Medication Sig Start Date End Date Taking? Authorizing Provider  ALPRAZolam Duanne Moron) 1 MG tablet Take 1 mg by mouth 2 (two) times daily as needed for anxiety.    Yes Historical Provider, MD  PARoxetine (PAXIL) 20 MG tablet Take 20 mg by mouth every morning.   Yes Historical Provider, MD   Allergies  Allergen Reactions  . Shellfish Allergy Anaphylaxis    FAMILY HISTORY:  No family history on file. SOCIAL HISTORY:  reports that he has never smoked. He does not have any smokeless tobacco history on file. He reports that he drinks alcohol. He reports that he does not use illicit drugs.  REVIEW OF SYSTEMS:   Unable to obtain.  SUBJECTIVE:   VITAL SIGNS: Temp:  [98 F (36.7 C)-98.3 F (36.8 C)] 98 F (36.7 C)  (09/22 0717) Pulse Rate:  [101-116] 107 (09/22 0717) Resp:  [17-27] 20 (09/22 0717) BP: (93-171)/(65-93) 138/79 mmHg (09/22 0717) SpO2:  [93 %-98 %] 97 % (09/22 0717) INTAKE / OUTPUT:  Intake/Output Summary (Last 24 hours) at 05/21/14 0849 Last data filed at 05/21/14 0600  Gross per 24 hour  Intake 1827.5 ml  Output      0 ml  Net 1827.5 ml    PHYSICAL EXAMINATION: General: no distress Neuro:  Sleepy, wakes up easily, follows commands, normal strength, no tremor HEENT:  Pupils reactive, no oral lesions Cardiovascular:  Regular, no murmur Lungs:  No wheeze/rales Abdomen:  Soft, non tender, +bowel sounds Musculoskeletal:  No edema Skin:  No rashes  LABS:  CBC  Recent Labs Lab 05/20/14 0805 05/21/14 0217  WBC 9.3 3.3*  HGB 17.5* 13.2  HCT 49.1 37.8*  PLT 240 137*   BMET  Recent Labs Lab 05/20/14 0805 05/21/14 0217  NA 137 133*  K 5.1 3.9  CL 92* 97  CO2 8* 18*  BUN 14 13  CREATININE 1.00 0.80  GLUCOSE 113* 99   Electrolytes  Recent Labs Lab 05/20/14 0805 05/21/14 0217  CALCIUM 8.5 8.0*  MG  --  2.0  PHOS  --  1.3*   Sepsis Markers  Recent Labs Lab 05/20/14 0945  LATICACIDVEN 7.7*   ABG  Recent Labs Lab 05/20/14 0926  PHART 7.200*  PCO2ART 24.0*  PO2ART 93.0   Liver Enzymes  Recent Labs Lab 05/20/14 0805 05/21/14 0217  AST 115* 73*  ALT 68* 45  ALKPHOS 57 35*  BILITOT 1.5* 1.8*  ALBUMIN 4.6 3.4*   Glucose No results found for this basename: GLUCAP,  in the last 168 hours  Imaging Dg Abd 1 View  05/20/2014   CLINICAL DATA:  Nausea and vomiting since early this morning  EXAM: ABDOMEN - 1 VIEW  COMPARISON:  None  FINDINGS: Normal bowel gas pattern.  No bowel dilatation or bowel wall thickening.  Osseous structures unremarkable.  Small rounded calcification inferior RIGHT pelvis likely phlebolith.  5 mm diameter calcification projects over LEFT sacral ala, could represent LEFT ureteral calculus, phlebolith or bone island.   IMPRESSION: 5 mm calcification projecting over LEFT sacral ala, could represent LEFT ureteral calculus, phlebolith or bone island; recommend correlation with urinalysis.  If there is clinical suspicion of a LEFT ureteral calculus consider noncontrast CT.   Electronically Signed   By: Lavonia Dana M.D.   On: 05/20/2014 11:28     ASSESSMENT / PLAN:  Acute alcohol withdrawal. Plan: Continue CIWA with prn ativan Thiamine, folic acid, IV fluid Okay to keep in SDU for now >> if gets worse, then transfer to ICU and add precedex  Hx of Bipolar disorder. Plan: Per primary team  Lactic acidosis >> likely from volume depletion and ?if he had ETOH related seizure. Plan: Continue IV fluids F/u lactic acid level  Hypophosphatemia. Plan: F/u and replace electrolytes as needed  Polycythemia likely from hypovolemia and hemoconcentration. Plan: F/u CBC  GERD. Plan: Protonix  CC time 35 minutes  Updated pt's wife at bedside.  Chesley Mires, MD [facility] Pulmonary/Critical Care 05/21/2014, 8:54 AM Pager:  [phone] After 3pm call: [phone]

## 2014-05-21 NOTE — ED Provider Notes (Signed)
Medical screening examination/treatment/procedure(s) were conducted as a shared visit with non-physician practitioner(s) and myself.  I personally evaluated the patient during the encounter.  Pt c/o heavy etoh abuse, and nv x multiple.    Iv ns bolus. O2. Continuous pulse ox and monitor.     Initially very tachycardic, sinus tachycardia on monitor.   Additional iv ns boluses.  Pt dehydrated.   Labs return, pt with marked metabolic acidosis.   abg w ph 7.2, marked met acidosis.  Pt denies any recent medication ingestion. No asa or acet abuse. No overdose. Denies any moonshine, methanol, or ethylene glycol use.  Denies any other chemical ingestion. No fevers.   Additional ivf and labs added.  Serial exams, no change in neuro exam. Chest cta. abd soft nt.  nv improved w rx/ivf.  Results for orders placed during the hospital encounter of 05/20/14  MRSA PCR SCREENING      Result Value Ref Range   MRSA by PCR NEGATIVE  NEGATIVE  LIPASE, BLOOD      Result Value Ref Range   Lipase 67 (*) 11 - 59 U/L  COMPREHENSIVE METABOLIC PANEL      Result Value Ref Range   Sodium 137  137 - 147 mEq/L   Potassium 5.1  3.7 - 5.3 mEq/L   Chloride 92 (*) 96 - 112 mEq/L   CO2 8 (*) 19 - 32 mEq/L   Glucose, Bld 113 (*) 70 - 99 mg/dL   BUN 14  6 - 23 mg/dL   Creatinine, Ser 1.00  0.50 - 1.35 mg/dL   Calcium 8.5  8.4 - 10.5 mg/dL   Total Protein 7.8  6.0 - 8.3 g/dL   Albumin 4.6  3.5 - 5.2 g/dL   AST 115 (*) 0 - 37 U/L   ALT 68 (*) 0 - 53 U/L   Alkaline Phosphatase 57  39 - 117 U/L   Total Bilirubin 1.5 (*) 0.3 - 1.2 mg/dL   GFR calc non Af Amer >90  >90 mL/min   GFR calc Af Amer >90  >90 mL/min   Anion gap 37 (*) 5 - 15  CBC WITH DIFFERENTIAL      Result Value Ref Range   WBC 9.3  4.0 - 10.5 K/uL   RBC 5.36  4.22 - 5.81 MIL/uL   Hemoglobin 17.5 (*) 13.0 - 17.0 g/dL   HCT 49.1  39.0 - 52.0 %   MCV 91.6  78.0 - 100.0 fL   MCH 32.6  26.0 - 34.0 pg   MCHC 35.6  30.0 - 36.0 g/dL   RDW 13.1   11.5 - 15.5 %   Platelets 240  150 - 400 K/uL   Neutrophils Relative % 88 (*) 43 - 77 %   Neutro Abs 8.3 (*) 1.7 - 7.7 K/uL   Lymphocytes Relative 6 (*) 12 - 46 %   Lymphs Abs 0.5 (*) 0.7 - 4.0 K/uL   Monocytes Relative 6  3 - 12 %   Monocytes Absolute 0.5  0.1 - 1.0 K/uL   Eosinophils Relative 0  0 - 5 %   Eosinophils Absolute 0.0  0.0 - 0.7 K/uL   Basophils Relative 0  0 - 1 %   Basophils Absolute 0.0  0.0 - 0.1 K/uL  ETHANOL      Result Value Ref Range   Alcohol, Ethyl (B) 178 (*) 0 - 11 mg/dL  SALICYLATE LEVEL      Result Value Ref Range   Salicylate Lvl <6.3 (*)  2.8 - 20.0 mg/dL  ACETAMINOPHEN LEVEL      Result Value Ref Range   Acetaminophen (Tylenol), Serum <15.0  10 - 30 ug/mL  ALCOHOL, METHYL (METHANOL), BLOOD      Result Value Ref Range   Methanol Lvl NEGATIVE    ALCOHOL,  ISOPROPYL (ISOPROPANOL)      Result Value Ref Range   Isopropanol NEGATIVE     Acetone NEGATIVE    AMMONIA      Result Value Ref Range   Ammonia 29  11 - 60 umol/L  URINE RAPID DRUG SCREEN (HOSP PERFORMED)      Result Value Ref Range   Opiates NONE DETECTED  NONE DETECTED   Cocaine NONE DETECTED  NONE DETECTED   Benzodiazepines NONE DETECTED  NONE DETECTED   Amphetamines NONE DETECTED  NONE DETECTED   Tetrahydrocannabinol NONE DETECTED  NONE DETECTED   Barbiturates NONE DETECTED  NONE DETECTED  ETHYLENE GLYCOL      Result Value Ref Range   Ethylene Glycol Lvl <5  <5 mg/dL  KETONES, QUALITATIVE      Result Value Ref Range   Acetone, Bld NEGATIVE  NEGATIVE  LACTIC ACID, PLASMA      Result Value Ref Range   Lactic Acid, Venous 7.7 (*) 0.5 - 2.2 mmol/L  COMPREHENSIVE METABOLIC PANEL      Result Value Ref Range   Sodium 133 (*) 137 - 147 mEq/L   Potassium 3.9  3.7 - 5.3 mEq/L   Chloride 97  96 - 112 mEq/L   CO2 18 (*) 19 - 32 mEq/L   Glucose, Bld 99  70 - 99 mg/dL   BUN 13  6 - 23 mg/dL   Creatinine, Ser 0.80  0.50 - 1.35 mg/dL   Calcium 8.0 (*) 8.4 - 10.5 mg/dL   Total Protein 5.7 (*)  6.0 - 8.3 g/dL   Albumin 3.4 (*) 3.5 - 5.2 g/dL   AST 73 (*) 0 - 37 U/L   ALT 45  0 - 53 U/L   Alkaline Phosphatase 35 (*) 39 - 117 U/L   Total Bilirubin 1.8 (*) 0.3 - 1.2 mg/dL   GFR calc non Af Amer >90  >90 mL/min   GFR calc Af Amer >90  >90 mL/min   Anion gap 18 (*) 5 - 15  CBC      Result Value Ref Range   WBC 3.3 (*) 4.0 - 10.5 K/uL   RBC 4.15 (*) 4.22 - 5.81 MIL/uL   Hemoglobin 13.2  13.0 - 17.0 g/dL   HCT 37.8 (*) 39.0 - 52.0 %   MCV 91.1  78.0 - 100.0 fL   MCH 31.8  26.0 - 34.0 pg   MCHC 34.9  30.0 - 36.0 g/dL   RDW 13.0  11.5 - 15.5 %   Platelets 137 (*) 150 - 400 K/uL  MAGNESIUM      Result Value Ref Range   Magnesium 2.0  1.5 - 2.5 mg/dL  PHOSPHORUS      Result Value Ref Range   Phosphorus 1.3 (*) 2.3 - 4.6 mg/dL  I-STAT ARTERIAL BLOOD GAS, ED      Result Value Ref Range   pH, Arterial 7.200 (*) 7.350 - 7.450   pCO2 arterial 24.0 (*) 35.0 - 45.0 mmHg   pO2, Arterial 93.0  80.0 - 100.0 mmHg   Bicarbonate 9.4 (*) 20.0 - 24.0 mEq/L   TCO2 10  0 - 100 mmol/L   O2 Saturation 95.0  Acid-base deficit 17.0 (*) 0.0 - 2.0 mmol/L   Patient temperature 98.6 F     Collection site RADIAL, ALLEN'S TEST ACCEPTABLE     Drawn by Operator     Sample type ARTERIAL     Dg Abd 1 View  05/20/2014   CLINICAL DATA:  Nausea and vomiting since early this morning  EXAM: ABDOMEN - 1 VIEW  COMPARISON:  None  FINDINGS: Normal bowel gas pattern.  No bowel dilatation or bowel wall thickening.  Osseous structures unremarkable.  Small rounded calcification inferior RIGHT pelvis likely phlebolith.  5 mm diameter calcification projects over LEFT sacral ala, could represent LEFT ureteral calculus, phlebolith or bone island.  IMPRESSION: 5 mm calcification projecting over LEFT sacral ala, could represent LEFT ureteral calculus, phlebolith or bone island; recommend correlation with urinalysis.  If there is clinical suspicion of a LEFT ureteral calculus consider noncontrast CT.   Electronically Signed    By: Lavonia Dana M.D.   On: 05/20/2014 11:28    Medical service contact for admission to stepdown.  CRITICAL CARE  RE: sev metabolic acidosis, alcoholic ketoacidosis, dehydration.  Performed by: Mirna Mires Total critical care time: 40 Critical care time was exclusive of separately billable procedures and treating other patients. Critical care was necessary to treat or prevent imminent or life-threatening deterioration. Critical care was time spent personally by me on the following activities: development of treatment plan with patient and/or surrogate as well as nursing, discussions with consultants, evaluation of patient's response to treatment, examination of patient, obtaining history from patient or surrogate, ordering and performing treatments and interventions, ordering and review of laboratory studies, ordering and review of radiographic studies, pulse oximetry and re-evaluation of patient's condition.     Mirna Mires, MD 05/21/14 1320

## 2014-05-21 NOTE — Progress Notes (Signed)
INITIAL NUTRITION ASSESSMENT  DOCUMENTATION CODES Per approved criteria  -Not Applicable   INTERVENTION: Supplement diet once appropriate.   NUTRITION DIAGNOSIS: Inadequate oral intake related to inability to eat as evidenced by NPO status.   Goal: Pt to meet >/= 90% of their estimated nutrition needs   Monitor:  Diet advancement, PO intake, weight trends, labs  Reason for Assessment: Pt identified as at nutrition risk on the Malnutrition Screen Tool  33 y.o. male  Admitting Dx: ETOH Withdrawal  ASSESSMENT: Admitted from home, found pt passed out in the bathroom. Pt with hx of ETOH abuse, drinks 5 bottles of vodka per week with wife, but also drinks alone as well.    No family present. Pt sleeping soundly. No signs of fat or muscle depletion.   Sodium and Phosphorus low.  Height: Ht Readings from Last 1 Encounters:  05/21/14 5\' 8"  (1.727 m)    Weight: Wt Readings from Last 1 Encounters:  05/21/14 147 lb 7.8 oz (66.9 kg)    Ideal Body Weight: 70 kg   % Ideal Body Weight: 96%  Wt Readings from Last 10 Encounters:  05/21/14 147 lb 7.8 oz (66.9 kg)    Usual Body Weight: unknown   % Usual Body Weight: -  BMI:  Body mass index is 22.43 kg/(m^2).  Estimated Nutritional Needs: Kcal: 2000-2200 Protein: 90-115 grams Fluid: > 2 L/day  Skin: WDL  Diet Order: NPO  EDUCATION NEEDS: -No education needs identified at this time   Intake/Output Summary (Last 24 hours) at 05/21/14 1503 Last data filed at 05/21/14 0600  Gross per 24 hour  Intake  827.5 ml  Output      0 ml  Net  827.5 ml    Last BM: 9/22   Labs:   Recent Labs Lab 05/20/14 0805 05/21/14 0217  NA 137 133*  K 5.1 3.9  CL 92* 97  CO2 8* 18*  BUN 14 13  CREATININE 1.00 0.80  CALCIUM 8.5 8.0*  MG  --  2.0  PHOS  --  1.3*  GLUCOSE 113* 99    CBG (last 3)  No results found for this basename: GLUCAP,  in the last 72 hours  Scheduled Meds: . folic acid  1 mg Oral Daily  .  heparin  5,000 Units Subcutaneous 3 times per day  . multivitamin with minerals  1 tablet Oral Daily  . ondansetron (ZOFRAN) IV  4 mg Intravenous 4 times per day  . [START ON 05/24/2014] pantoprazole (PROTONIX) IV  40 mg Intravenous Q12H  . PARoxetine  20 mg Oral Daily  . thiamine  100 mg Oral Daily    Continuous Infusions: . sodium chloride 150 mL/hr at 05/21/14 1437  . pantoprozole (PROTONIX) infusion 8 mg/hr (05/21/14 1007)    Past Medical History  Diagnosis Date  . Anxiety   . Bipolar affective disorder   . Alcohol abuse     History reviewed. No pertinent past surgical history.  [place], [place], [place] Pager [phone] After Hours Pager

## 2014-05-21 NOTE — Progress Notes (Signed)
Note: This document was prepared with digital dictation and possible smart phrase technology. Any transcriptional errors that result from this process are unintentional.   Chad Mclaughlin RFF:[id number] DOB: [birth year] DOA: 05/20/2014 PCP: No primary provider on file.  Brief narrative: 33 y/o ? Known Heavy ETOH use [3-5 bottle vodka a week], rior h/o bipolar on meds, admitted with episodic n/v and diarrhea admitted to [facility] SDU 05/21/14 with n/v and withdrawal Wife reports dark vomit and unresponsive episode  Past medical history-As per Problem list Chart reviewed as below- reviewed  Consultants:   CCM  Procedures:   none  Antibiotics:  one   Subjective  Alert but shakey and uncomfortable. Just had explosive watery bowel movement. Oriented but appears ill   Objective    Interim History:   Telemetry: Sinus tach   Objective: Filed Vitals:   05/20/14 2015 05/20/14 2200 05/21/14 0400 05/21/14 0717  BP: 137/84 140/93 113/80 138/79  Pulse: 102  108 107  Temp:    98 F (36.7 C)  TempSrc: Oral   Oral  Resp: 19 26 26 20   SpO2: 98%  97% 97%    Intake/Output Summary (Last 24 hours) at 05/21/14 0816 Last data filed at 05/21/14 0600  Gross per 24 hour  Intake 1827.5 ml  Output      0 ml  Net 1827.5 ml    Exam:  General: alert uncomfortable and sjaking Cardiovascular: s1 s 2 tachcyardic Respiratory:  Clinically clear.  No added sound currently Abdomen:  Soft, slighlty distended.  No rebound/gaurd Skin no edema Neuro oriented but shakey  Data Reviewed: Basic Metabolic Panel:  Recent Labs Lab 05/20/14 0805 05/21/14 0217  NA 137 133*  K 5.1 3.9  CL 92* 97  CO2 8* 18*  GLUCOSE 113* 99  BUN 14 13  CREATININE 1.00 0.80  CALCIUM 8.5 8.0*  MG  --  2.0  PHOS  --  1.3*   Liver Function Tests:  Recent Labs Lab 05/20/14 0805 05/21/14 0217  AST 115* 73*  ALT 68* 45  ALKPHOS 57 35*  BILITOT 1.5* 1.8*  PROT 7.8 5.7*  ALBUMIN 4.6 3.4*    Recent  Labs Lab 05/20/14 0805  LIPASE 67*    Recent Labs Lab 05/20/14 0945  AMMONIA 29   CBC:  Recent Labs Lab 05/20/14 0805 05/21/14 0217  WBC 9.3 3.3*  NEUTROABS 8.3*  --   HGB 17.5* 13.2  HCT 49.1 37.8*  MCV 91.6 91.1  PLT 240 137*   Cardiac Enzymes: No results found for this basename: CKTOTAL, CKMB, CKMBINDEX, TROPONINI,  in the last 168 hours BNP: No components found with this basename: POCBNP,  CBG: No results found for this basename: GLUCAP,  in the last 168 hours  Recent Results (from the past 240 hour(s))  MRSA PCR SCREENING     Status: None   Collection Time    05/20/14  2:27 PM      Result Value Ref Range Status   MRSA by PCR NEGATIVE  NEGATIVE Final   Comment:            The GeneXpert MRSA Assay (FDA     approved for NASAL specimens     only), is one component of a     comprehensive MRSA colonization     surveillance program. It is not     intended to diagnose MRSA     infection nor to guide or     monitor treatment for     MRSA  infections.     Studies:              All Imaging reviewed and is as per above notation   Scheduled Meds: . sodium chloride   Intravenous STAT  . folic acid  1 mg Oral Daily  . heparin  5,000 Units Subcutaneous 3 times per day  . LORazepam  1 mg Intravenous Q2H  . multivitamin with minerals  1 tablet Oral Daily  . ondansetron (ZOFRAN) IV  4 mg Intravenous 4 times per day  . PARoxetine  20 mg Oral Daily  . thiamine  100 mg Oral Daily   Continuous Infusions: . sodium chloride 150 mL/hr at 05/21/14 0542     Assessment/Plan: 1. Severe DT's-Add Librium 50 mg STAT to Ativan protocol-needed Ativan 10 doses overnight and was agitated and found wandering.  Rpt CIWA. Have asked CCM to evaluate as will probably need Precedex Gtt and intubation.  Keep NPO for now 2. Severe lactic acidosis-2/2 to ETOH-Initial AG=37.  Currently 18.   is 6.2 indicating concurrent metabolic alkalosis-which may be explained by him vomiting.  His  ingestions screen for Methylene glycol and other ingestiosn such as tylenol and salicylates is neg.  Continue NS at 150 cc/hr 3. Diarrhea-unlikely infectious but get CDIFF pcr to be sure.  No recent reported antibiotic use 4. Possible UGIB as had dark vomit?-Start Pronix GTT.  Trend Hb. 5. Bipolar-continue Paxil 20 mg daily when able  Code Status: full Family Communication:  D/w wife at bedside Disposition Plan: sdu-potentially needs ICU level care   Chad Griffes, MD  [facility] Pager [phone] 05/21/2014, 8:16 AM    LOS: 1 day

## 2014-05-22 DIAGNOSIS — E876 Hypokalemia: Secondary | ICD-10-CM | POA: Diagnosis not present

## 2014-05-22 DIAGNOSIS — F10239 Alcohol dependence with withdrawal, unspecified: Principal | ICD-10-CM | POA: Diagnosis present

## 2014-05-22 DIAGNOSIS — F10939 Alcohol use, unspecified with withdrawal, unspecified: Principal | ICD-10-CM

## 2014-05-22 DIAGNOSIS — D61818 Other pancytopenia: Secondary | ICD-10-CM | POA: Diagnosis not present

## 2014-05-22 LAB — COMPREHENSIVE METABOLIC PANEL
ALT: 39 U/L (ref 0–53)
ANION GAP: 16 — AB (ref 5–15)
AST: 69 U/L — AB (ref 0–37)
Albumin: 3.1 g/dL — ABNORMAL LOW (ref 3.5–5.2)
Alkaline Phosphatase: 37 U/L — ABNORMAL LOW (ref 39–117)
BILIRUBIN TOTAL: 1.3 mg/dL — AB (ref 0.3–1.2)
BUN: 7 mg/dL (ref 6–23)
CHLORIDE: 101 meq/L (ref 96–112)
CO2: 21 mEq/L (ref 19–32)
Calcium: 7.8 mg/dL — ABNORMAL LOW (ref 8.4–10.5)
Creatinine, Ser: 0.8 mg/dL (ref 0.50–1.35)
GFR calc Af Amer: >90 mL/min (ref >90)
GLUCOSE: 79 mg/dL (ref 70–99)
Potassium: 3.2 mEq/L — ABNORMAL LOW (ref 3.7–5.3)
Sodium: 138 mEq/L (ref 137–147)
Total Protein: 5.4 g/dL — ABNORMAL LOW (ref 6.0–8.3)

## 2014-05-22 LAB — CBC
HCT: 36.2 % — ABNORMAL LOW (ref 39.0–52.0)
Hemoglobin: 12.5 g/dL — ABNORMAL LOW (ref 13.0–17.0)
MCH: 32.3 pg (ref 26.0–34.0)
MCHC: 34.5 g/dL (ref 30.0–36.0)
MCV: 93.5 fL (ref 78.0–100.0)
PLATELETS: 113 10*3/uL — AB (ref 150–400)
RBC: 3.87 MIL/uL — ABNORMAL LOW (ref 4.22–5.81)
RDW: 12.9 % (ref 11.5–15.5)
WBC: 2.9 10*3/uL — AB (ref 4.0–10.5)

## 2014-05-22 LAB — PHOSPHORUS: Phosphorus: 1.6 mg/dL — ABNORMAL LOW (ref 2.3–4.6)

## 2014-05-22 LAB — MAGNESIUM: Magnesium: 2.1 mg/dL (ref 1.5–2.5)

## 2014-05-22 MED ORDER — LORAZEPAM 1 MG PO TABS
1.0000 mg | ORAL_TABLET | Freq: Four times a day (QID) | ORAL | Status: DC
  Administered 2014-05-22 – 2014-05-23 (×5): 1 mg via ORAL
  Filled 2014-05-22 (×4): qty 1

## 2014-05-22 MED ORDER — POTASSIUM CHLORIDE 10 MEQ/100ML IV SOLN
10.0000 meq | INTRAVENOUS | Status: AC
  Administered 2014-05-22 (×4): 10 meq via INTRAVENOUS
  Filled 2014-05-22 (×3): qty 100

## 2014-05-22 MED ORDER — LORAZEPAM 1 MG PO TABS
1.0000 mg | ORAL_TABLET | Freq: Four times a day (QID) | ORAL | Status: DC
  Filled 2014-05-22: qty 1

## 2014-05-22 MED ORDER — POTASSIUM CHLORIDE IN NACL 40-0.9 MEQ/L-% IV SOLN
INTRAVENOUS | Status: DC
  Administered 2014-05-22 (×2): 75 mL/h via INTRAVENOUS
  Filled 2014-05-22 (×4): qty 1000

## 2014-05-22 MED ORDER — CLONIDINE HCL 0.1 MG PO TABS
0.1000 mg | ORAL_TABLET | Freq: Two times a day (BID) | ORAL | Status: DC
  Administered 2014-05-22 – 2014-05-23 (×3): 0.1 mg via ORAL
  Filled 2014-05-22 (×4): qty 1

## 2014-05-22 NOTE — Progress Notes (Signed)
PULMONARY / CRITICAL CARE MEDICINE   Name: Chad Mclaughlin MRN: [id number] DOB: 01/06/1981    ADMISSION DATE:  05/20/2014 CONSULTATION DATE:  05/21/2014  REFERRING MD : Verlon Au  CHIEF COMPLAINT:  Withdrawal  INITIAL PRESENTATION:  33 yo male with hx of ETOH found passed out in bathroom by wife.  PCCM asked to assess for management of ETOH withdrawal.  STUDIES:   SIGNIFICANT EVENTS: 9/21 Admit   HISTORY OF PRESENT ILLNESS:   Hx from pt's wife.    Pt drinks 5 bottles of vodka per week with his wife, but then also drinks more alcohol on his own.  He was having episodes of vomiting after drinking on 9/21.  Wife went to bed at 10 pm, and pt was in bathroom vomiting.  Wife found pt passed out on floor in bathroom.  As a result she had him brought to ER.  There is no hx of seizures.  Pt has hx of Bipolar and takes paxil and xanax.  He also has problems with reflux and uses OTC stomach medications.  No hx of smoking, illicit drug use, or drinking moonshine.   SUBJECTIVE:  Patient fully awake and alert, no major withdrawal symptoms, states that he is ready to cut back on drinking but not completely quit yet.   VITAL SIGNS: Temp:  [97.9 F (36.6 C)-98.9 F (37.2 C)] 98.9 F (37.2 C) (09/23 1200) Pulse Rate:  [87-106] 100 (09/23 1200) Resp:  [15-27] 23 (09/23 1200) BP: (125-142)/(86-97) 131/90 mmHg (09/23 1200) SpO2:  [92 %-99 %] 96 % (09/23 1200) INTAKE / OUTPUT:  Intake/Output Summary (Last 24 hours) at 05/22/14 1557 Last data filed at 05/22/14 1215  Gross per 24 hour  Intake 5187.08 ml  Output   2600 ml  Net 2587.08 ml    PHYSICAL EXAMINATION: General: no distress Neuro:  Easily arousable, AAOx3 HEENT:  Pupils reactive, no oral lesions Cardiovascular:  Regular, no murmur Lungs:  No wheeze/rales Abdomen:  Soft, non tender, +bowel sounds Musculoskeletal:  No edema Skin:  No rashes  LABS:  CBC  Recent Labs Lab 05/20/14 0805 05/21/14 0217 05/22/14 0701  WBC 9.3  3.3* 2.9*  HGB 17.5* 13.2 12.5*  HCT 49.1 37.8* 36.2*  PLT 240 137* 113*   BMET  Recent Labs Lab 05/20/14 0805 05/21/14 0217 05/22/14 0701  NA 137 133* 138  K 5.1 3.9 3.2*  CL 92* 97 101  CO2 8* 18* 21  BUN 14 13 7   CREATININE 1.00 0.80 0.80  GLUCOSE 113* 99 79   Electrolytes  Recent Labs Lab 05/20/14 0805 05/21/14 0217 05/22/14 0701  CALCIUM 8.5 8.0* 7.8*  MG  --  2.0 2.1  PHOS  --  1.3* 1.6*   Sepsis Markers  Recent Labs Lab 05/20/14 0945  LATICACIDVEN 7.7*   ABG  Recent Labs Lab 05/20/14 0926  PHART 7.200*  PCO2ART 24.0*  PO2ART 93.0   Liver Enzymes  Recent Labs Lab 05/20/14 0805 05/21/14 0217 05/22/14 0701  AST 115* 73* 69*  ALT 68* 45 39  ALKPHOS 57 35* 37*  BILITOT 1.5* 1.8* 1.3*  ALBUMIN 4.6 3.4* 3.1*   Glucose No results found for this basename: GLUCAP,  in the last 168 hours  Imaging No results found.   ASSESSMENT / PLAN:  Acute alcohol withdrawal. Plan: Continue CIWA with prn ativan Thiamine, folic acid, IV fluid Patient stable hemodynamically from ICU standpoint.   Hx of Bipolar disorder. Plan: Per primary team  Lactic acidosis >> likely from volume depletion and ?  if he had ETOH related seizure. Plan: Continue IV fluids, can probably switch to PO today.    Hypophosphatemia. Plan: F/u and replace electrolytes as needed  Polycythemia likely from hypovolemia and hemoconcentration. Plan: F/u CBC  GERD. Plan: Protonix   Thank you for consulting CCM. CCM will signoff.  Please feel free to call with any questions.    Vilinda Boehringer, MD [place] [facility] Pager [phone] On Call Pager [phone]

## 2014-05-22 NOTE — Progress Notes (Signed)
Chart reviewed.  [facility] PROGRESS NOTE  Chad Mclaughlin RJJ:[id number] DOB: 1981/03/03 DOA: 05/20/2014 PCP: No primary provider on file.  Assessment/Plan:  Principal Problem:   Alcoholic ketoacidosis resolving.  Active Problems:   Nausea and vomiting resolved. No evidence of ongoing bleeding. Start clears and change protonix gtt to bid protonix. c diff neg   Alcohol abuse: sw consult.   Alcohol withdrawal ciwas improving. Still slightly tremulous. Not ready for discharge   Other pancytopenia likely from IVF and possibly EtOH Hypokalemia: replete IV and check mag  Code Status:  full Family Communication:   Disposition Plan:  home  Consultants:  PCCM  Procedures:     Antibiotics:    HPI/Subjective: "better" "can i go home?" interested in "cutting back" on drinking, but wants to "have a drink on weekends if everyone else is" no n/v/d  Objective: Filed Vitals:   05/22/14 0809  BP: 139/93  Pulse: 87  Temp: 97.9 F (36.6 C)  Resp: 16    Intake/Output Summary (Last 24 hours) at 05/22/14 0917 Last data filed at 05/22/14 0800  Gross per 24 hour  Intake 4447.08 ml  Output   1650 ml  Net 2797.08 ml   Filed Weights   05/21/14 1100  Weight: 66.9 kg (147 lb 7.8 oz)    Exam:   General:  Alert, cooperative. Disoriented to time, but otherwise appropriate  Cardiovascular: RRR without MGR  Respiratory: CTA without WRR  Abdomen: soft. protruberant  Ext: no CCE  Neuro: slight tremor. No asterixis  Basic Metabolic Panel:  Recent Labs Lab 05/20/14 0805 05/21/14 0217 05/22/14 0701  NA 137 133* 138  K 5.1 3.9 3.2*  CL 92* 97 101  CO2 8* 18* 21  GLUCOSE 113* 99 79  BUN 14 13 7   CREATININE 1.00 0.80 0.80  CALCIUM 8.5 8.0* 7.8*  MG  --  2.0 2.1  PHOS  --  1.3* 1.6*   Liver Function Tests:  Recent Labs Lab 05/20/14 0805 05/21/14 0217 05/22/14 0701  AST 115* 73* 69*  ALT 68* 45 39  ALKPHOS 57 35* 37*  BILITOT 1.5* 1.8* 1.3*  PROT 7.8  5.7* 5.4*  ALBUMIN 4.6 3.4* 3.1*    Recent Labs Lab 05/20/14 0805  LIPASE 67*    Recent Labs Lab 05/20/14 0945  AMMONIA 29   CBC:  Recent Labs Lab 05/20/14 0805 05/21/14 0217 05/22/14 0701  WBC 9.3 3.3* 2.9*  NEUTROABS 8.3*  --   --   HGB 17.5* 13.2 12.5*  HCT 49.1 37.8* 36.2*  MCV 91.6 91.1 93.5  PLT 240 137* 113*   Cardiac Enzymes: No results found for this basename: CKTOTAL, CKMB, CKMBINDEX, TROPONINI,  in the last 168 hours BNP (last 3 results) No results found for this basename: PROBNP,  in the last 8760 hours CBG: No results found for this basename: GLUCAP,  in the last 168 hours  Recent Results (from the past 240 hour(s))  MRSA PCR SCREENING     Status: None   Collection Time    05/20/14  2:27 PM      Result Value Ref Range Status   MRSA by PCR NEGATIVE  NEGATIVE Final   Comment:            The GeneXpert MRSA Assay (FDA     approved for NASAL specimens     only), is one component of a     comprehensive MRSA colonization     surveillance program. It is not  intended to diagnose MRSA     infection nor to guide or     monitor treatment for     MRSA infections.  CLOSTRIDIUM DIFFICILE BY PCR     Status: None   Collection Time    05/21/14 12:10 PM      Result Value Ref Range Status   C difficile by pcr NEGATIVE  NEGATIVE Final     Studies: Dg Abd 1 View  05/20/2014   CLINICAL DATA:  Nausea and vomiting since early this morning  EXAM: ABDOMEN - 1 VIEW  COMPARISON:  None  FINDINGS: Normal bowel gas pattern.  No bowel dilatation or bowel wall thickening.  Osseous structures unremarkable.  Small rounded calcification inferior RIGHT pelvis likely phlebolith.  5 mm diameter calcification projects over LEFT sacral ala, could represent LEFT ureteral calculus, phlebolith or bone island.  IMPRESSION: 5 mm calcification projecting over LEFT sacral ala, could represent LEFT ureteral calculus, phlebolith or bone island; recommend correlation with urinalysis.  If  there is clinical suspicion of a LEFT ureteral calculus consider noncontrast CT.   Electronically Signed   By: Lavonia Dana M.D.   On: 05/20/2014 11:28    Scheduled Meds: . folic acid  1 mg Oral Daily  . heparin  5,000 Units Subcutaneous 3 times per day  . multivitamin with minerals  1 tablet Oral Daily  . ondansetron (ZOFRAN) IV  4 mg Intravenous 4 times per day  . [START ON 05/24/2014] pantoprazole (PROTONIX) IV  40 mg Intravenous Q12H  . PARoxetine  20 mg Oral Daily  . thiamine  100 mg Oral Daily   Continuous Infusions: . sodium chloride 150 mL/hr at 05/22/14 0146  . pantoprozole (PROTONIX) infusion 8 mg/hr (05/21/14 2000)    Time spent: 35 minutes  [facility] Pager [phone]. If 7PM-7AM, please contact night-coverage at [email or web address], password [facility] 05/22/2014, 9:17 AM  LOS: 2 days

## 2014-05-23 LAB — BASIC METABOLIC PANEL
Anion gap: 14 (ref 5–15)
BUN: 6 mg/dL (ref 6–23)
CO2: 22 mEq/L (ref 19–32)
Calcium: 8.1 mg/dL — ABNORMAL LOW (ref 8.4–10.5)
Chloride: 101 mEq/L (ref 96–112)
Creatinine, Ser: 0.73 mg/dL (ref 0.50–1.35)
Glucose, Bld: 96 mg/dL (ref 70–99)
POTASSIUM: 3.9 meq/L (ref 3.7–5.3)
Sodium: 137 mEq/L (ref 137–147)

## 2014-05-23 LAB — CBC WITH DIFFERENTIAL/PLATELET
Basophils Absolute: 0 10*3/uL (ref 0.0–0.1)
Basophils Relative: 1 % (ref 0–1)
Eosinophils Absolute: 0.2 10*3/uL (ref 0.0–0.7)
Eosinophils Relative: 5 % (ref 0–5)
HCT: 36.9 % — ABNORMAL LOW (ref 39.0–52.0)
Hemoglobin: 13.1 g/dL (ref 13.0–17.0)
LYMPHS ABS: 0.7 10*3/uL (ref 0.7–4.0)
Lymphocytes Relative: 19 % (ref 12–46)
MCH: 32.1 pg (ref 26.0–34.0)
MCHC: 35.5 g/dL (ref 30.0–36.0)
MCV: 90.4 fL (ref 78.0–100.0)
Monocytes Absolute: 0.4 10*3/uL (ref 0.1–1.0)
Monocytes Relative: 11 % (ref 3–12)
NEUTROS ABS: 2.4 10*3/uL (ref 1.7–7.7)
NEUTROS PCT: 64 % (ref 43–77)
PLATELETS: 129 10*3/uL — AB (ref 150–400)
RBC: 4.08 MIL/uL — ABNORMAL LOW (ref 4.22–5.81)
RDW: 12.8 % (ref 11.5–15.5)
WBC: 3.7 10*3/uL — ABNORMAL LOW (ref 4.0–10.5)

## 2014-05-23 MED ORDER — CLONIDINE HCL 0.1 MG PO TABS: 0.1000 mg | ORAL_TABLET | Freq: Once | ORAL | Status: DC

## 2014-05-23 MED ORDER — LORAZEPAM 1 MG PO TABS
1.0000 mg | ORAL_TABLET | ORAL | Status: AC
  Administered 2014-05-23: 1 mg via ORAL
  Filled 2014-05-23: qty 1

## 2014-05-23 MED ORDER — ENSURE COMPLETE PO LIQD: 237.0000 mL | Freq: Two times a day (BID) | ORAL | Status: DC

## 2014-05-23 MED ORDER — PANTOPRAZOLE SODIUM 40 MG PO TBEC
40.0000 mg | DELAYED_RELEASE_TABLET | Freq: Two times a day (BID) | ORAL | Status: DC
  Administered 2014-05-23: 40 mg via ORAL
  Filled 2014-05-23: qty 1

## 2014-05-23 NOTE — Progress Notes (Addendum)
NUTRITION FOLLOW UP  Intervention:   Ensure Complete po BID, each supplement provides 350 kcal and 13 grams of protein  Nutrition Dx:   Predicted suboptimal nutrient intake related to alcohol abuse as evidenced by diet hx.   Goal:   Pt to meet >/= 90% of their estimated nutrition needs   Monitor:   PO/supplement intake, weight trends, labs  Assessment:   Admitted from home, found pt passed out in the bathroom. Pt with hx of ETOH abuse, drinks 5 bottles of vodka per week with wife, but also drinks alone as well.   Pt advanced to a regular diet on 05/22/14; no documented PO intake currently available at this time. Pt is asleep at time of visit and no family present. No that diet is advanced, will add Ensure Complete po BID, each supplement provides 350 kcal and 13 grams of protein for additional nutrition support.  Per nursing, pt is very anxious to go home.   Labs reviewed. Na and Phos now WDL. Noted pt on IVF, MVI, thiamine, and folic acid.   Height: Ht Readings from Last 1 Encounters:  05/21/14 5\' 6"  (1.676 m)    Weight Status:   Wt Readings from Last 1 Encounters:  05/21/14 147 lb 7.8 oz (66.9 kg)    Re-estimated needs:  Kcal: 2000-2200  Protein: 90-115 grams  Fluid: > 2 L/day  Skin: Intact  Diet Order: General   Intake/Output Summary (Last 24 hours) at 05/23/14 0853 Last data filed at 05/23/14 0700  Gross per 24 hour  Intake   2690 ml  Output   3050 ml  Net   -360 ml    Last BM: 05/22/14   Labs:   Recent Labs Lab 05/20/14 0805 05/21/14 0217 05/22/14 0701 05/23/14 0427  NA 137 133* 138 137  K 5.1 3.9 3.2* 3.9  CL 92* 97 101 101  CO2 8* 18* 21 22  BUN 14 13 7 6   CREATININE 1.00 0.80 0.80 0.73  CALCIUM 8.5 8.0* 7.8* 8.1*  MG  --  2.0 2.1  --   PHOS  --  1.3* 1.6*  --   GLUCOSE 113* 99 79 96    CBG (last 3)  No results found for this basename: GLUCAP,  in the last 72 hours  Scheduled Meds: . cloNIDine  0.1 mg Oral BID  . folic acid  1 mg  Oral Daily  . heparin  5,000 Units Subcutaneous 3 times per day  . LORazepam  1 mg Oral Q6H  . multivitamin with minerals  1 tablet Oral Daily  . ondansetron (ZOFRAN) IV  4 mg Intravenous 4 times per day  . pantoprazole  40 mg Oral BID  . PARoxetine  20 mg Oral Daily  . thiamine  100 mg Oral Daily    Continuous Infusions: . 0.9 % NaCl with KCl 40 mEq / L 75 mL/hr at 05/23/14 0700    Dmetrius Ambs A. Jimmye Norman, RD, LDN Pager: [phone]

## 2014-05-23 NOTE — Progress Notes (Signed)
Pt discharged to the front of the hospital, ambulated refused wheelchair.

## 2014-05-23 NOTE — Progress Notes (Signed)
Patient anxious wants to leave hospital to go home.Encouraged patient to stay until doctor comes in morning.Blood pressure elevated 141/107 .Received ativan 1 mg last at 0409.Call placed to Forrest Moron NP.Plan to give additional 1 mg of ativan and will recheck blood pressure.

## 2014-05-23 NOTE — Discharge Summary (Signed)
Physician Discharge Summary  Chad Mclaughlin MHD:[id number] DOB: [birth year] DOA: 05/20/2014  PCP: No primary provider on file.  Admit date: 05/20/2014 Discharge date: 05/23/2014  Time spent: greater than 30 minutes  Recommendations for Outpatient Follow-up:  1. Monitor BMET, CBC  Discharge Diagnoses:  Principal Problem:   Alcoholic ketoacidosis Active Problems:   Nausea and vomiting   Alcohol abuse   Alcohol withdrawal   Other pancytopenia   Hypokalemia   Discharge Condition: stable  Filed Weights   05/21/14 1100  Weight: 66.9 kg (147 lb 7.8 oz)    History of present illness:  33 y.o. male, presents with intractable nausea and vomiting, patient reports a history of heavy alcohol abuse, he drinks fifth of Vodka every 3 days, but reports he had a long weekend where he was drinking one fifth per day, wife at bedside reports he hasn't been eating or drinking much over the last 3 days, then nausea and vomiting since yesterday night, which prompted him to come to ED, she denies any using any drug, no Tylenol, no aspirin, no BC powder, no use of any mouth rinse or any antifreeze,Reports he has just been drinking vodka.  Upon presentation to ED patient had significant nausea and vomiting, sinus tachycardia in the 140s, clinical dehydration,, ABG showing metabolic acidosis with pH of 7.2, PCO2 of 24, bicarbonate of 8, anion gap of 37, heart rate improved with IV fluids, salicylate level, tylenol level negligible  Hospital Course:  Admitted to stepdown. Started on IVF, thiamine, ativan protocol.  Alcoholic ketoacidosis: resolved by discharge. Nausea and vomiting: developed dark emesis. Started on protonix. H/h remained stable. By discharge, tolerating solids Alcohol abuse: consulted social work. Patient not committed to quitting, but would like to "cut down" Alcohol withdrawal: did have signs of withdrawal during hospitalization. Improved by discharge. mild pancytopenia likely from IVF and  possibly EtOH  Hypokalemia: corrected  Procedures:  none  Consultations:  PCCM  Discharge Exam: Filed Vitals:   05/23/14 0800  BP: 126/92  Pulse:   Temp:   Resp: 22    General: alert, oriented. cooperative Cardiovascular: RRR Respiratory: CTA Abd: s, nt, nd No CCE   Discharge Instructions   Activity as tolerated - No restrictions    Complete by:  As directed      Diet general    Complete by:  As directed           Current Discharge Medication List    CONTINUE these medications which have NOT CHANGED   Details  ALPRAZolam (XANAX) 1 MG tablet Take 1 mg by mouth 2 (two) times daily as needed for anxiety.     PARoxetine (PAXIL) 20 MG tablet Take 20 mg by mouth every morning.       Allergies  Allergen Reactions  . Shellfish Allergy Anaphylaxis      The results of significant diagnostics from this hospitalization (including imaging, microbiology, ancillary and laboratory) are listed below for reference.    Significant Diagnostic Studies: Dg Abd 1 View  05/20/2014   CLINICAL DATA:  Nausea and vomiting since early this morning  EXAM: ABDOMEN - 1 VIEW  COMPARISON:  None  FINDINGS: Normal bowel gas pattern.  No bowel dilatation or bowel wall thickening.  Osseous structures unremarkable.  Small rounded calcification inferior RIGHT pelvis likely phlebolith.  5 mm diameter calcification projects over LEFT sacral ala, could represent LEFT ureteral calculus, phlebolith or bone island.  IMPRESSION: 5 mm calcification projecting over LEFT sacral ala, could represent LEFT ureteral calculus,  phlebolith or bone island; recommend correlation with urinalysis.  If there is clinical suspicion of a LEFT ureteral calculus consider noncontrast CT.   Electronically Signed   By: Lavonia Dana M.D.   On: 05/20/2014 11:28    Microbiology: Recent Results (from the past 240 hour(s))  MRSA PCR SCREENING     Status: None   Collection Time    05/20/14  2:27 PM      Result Value Ref Range  Status   MRSA by PCR NEGATIVE  NEGATIVE Final   Comment:            The GeneXpert MRSA Assay (FDA     approved for NASAL specimens     only), is one component of a     comprehensive MRSA colonization     surveillance program. It is not     intended to diagnose MRSA     infection nor to guide or     monitor treatment for     MRSA infections.  CLOSTRIDIUM DIFFICILE BY PCR     Status: None   Collection Time    05/21/14 12:10 PM      Result Value Ref Range Status   C difficile by pcr NEGATIVE  NEGATIVE Final     Labs: Basic Metabolic Panel:  Recent Labs Lab 05/20/14 0805 05/21/14 0217 05/22/14 0701 05/23/14 0427  NA 137 133* 138 137  K 5.1 3.9 3.2* 3.9  CL 92* 97 101 101  CO2 8* 18* 21 22  GLUCOSE 113* 99 79 96  BUN 14 13 7 6   CREATININE 1.00 0.80 0.80 0.73  CALCIUM 8.5 8.0* 7.8* 8.1*  MG  --  2.0 2.1  --   PHOS  --  1.3* 1.6*  --    Liver Function Tests:  Recent Labs Lab 05/20/14 0805 05/21/14 0217 05/22/14 0701  AST 115* 73* 69*  ALT 68* 45 39  ALKPHOS 57 35* 37*  BILITOT 1.5* 1.8* 1.3*  PROT 7.8 5.7* 5.4*  ALBUMIN 4.6 3.4* 3.1*    Recent Labs Lab 05/20/14 0805  LIPASE 67*    Recent Labs Lab 05/20/14 0945  AMMONIA 29   CBC:  Recent Labs Lab 05/20/14 0805 05/21/14 0217 05/22/14 0701 05/23/14 0427  WBC 9.3 3.3* 2.9* 3.7*  NEUTROABS 8.3*  --   --  2.4  HGB 17.5* 13.2 12.5* 13.1  HCT 49.1 37.8* 36.2* 36.9*  MCV 91.6 91.1 93.5 90.4  PLT 240 137* 113* 129*   Cardiac Enzymes: No results found for this basename: CKTOTAL, CKMB, CKMBINDEX, TROPONINI,  in the last 168 hours BNP: BNP (last 3 results) No results found for this basename: PROBNP,  in the last 8760 hours CBG: No results found for this basename: GLUCAP,  in the last 168 hours     Signed:  Gerald Honea L  [facility] 05/23/2014, 11:12 AM

## 2014-06-21 ENCOUNTER — Emergency Department (HOSPITAL_COMMUNITY)
Admission: EM | Admit: 2014-06-21 | Discharge: 2014-06-22 | Disposition: A | Discharge status: Other Acute Inpatient Hospital | Payer: Commercial Managed Care - PPO | Attending: Emergency Medicine | Admitting: Emergency Medicine

## 2014-06-21 ENCOUNTER — Encounter (HOSPITAL_COMMUNITY): Payer: Self-pay | Admitting: Emergency Medicine

## 2014-06-21 DIAGNOSIS — F1010 Alcohol abuse, uncomplicated: Secondary | ICD-10-CM

## 2014-06-21 DIAGNOSIS — Z79899 Other long term (current) drug therapy: Secondary | ICD-10-CM | POA: Diagnosis not present

## 2014-06-21 DIAGNOSIS — F319 Bipolar disorder, unspecified: Secondary | ICD-10-CM | POA: Diagnosis not present

## 2014-06-21 DIAGNOSIS — R000 Tachycardia, unspecified: Secondary | ICD-10-CM | POA: Diagnosis not present

## 2014-06-21 DIAGNOSIS — F419 Anxiety disorder, unspecified: Secondary | ICD-10-CM | POA: Diagnosis not present

## 2014-06-21 LAB — COMPREHENSIVE METABOLIC PANEL
ALT: 85 U/L — ABNORMAL HIGH (ref 0–53)
AST: 183 U/L — ABNORMAL HIGH (ref 0–37)
Albumin: 4.4 g/dL (ref 3.5–5.2)
Alkaline Phosphatase: 57 U/L (ref 39–117)
Anion gap: 23 — ABNORMAL HIGH (ref 5–15)
BILIRUBIN TOTAL: 1.3 mg/dL — AB (ref 0.3–1.2)
BUN: 5 mg/dL — ABNORMAL LOW (ref 6–23)
CHLORIDE: 95 meq/L — AB (ref 96–112)
CO2: 21 meq/L (ref 19–32)
CREATININE: 0.81 mg/dL (ref 0.50–1.35)
Calcium: 8.9 mg/dL (ref 8.4–10.5)
GFR calc Af Amer: >90 mL/min (ref >90)
GLUCOSE: 96 mg/dL (ref 70–99)
Potassium: 4.1 mEq/L (ref 3.7–5.3)
Sodium: 139 mEq/L (ref 137–147)
Total Protein: 7.4 g/dL (ref 6.0–8.3)

## 2014-06-21 LAB — RAPID URINE DRUG SCREEN, HOSP PERFORMED
AMPHETAMINES: NOT DETECTED
Barbiturates: NOT DETECTED
Benzodiazepines: NOT DETECTED
Cocaine: NOT DETECTED
Opiates: NOT DETECTED
Tetrahydrocannabinol: NOT DETECTED

## 2014-06-21 LAB — BASIC METABOLIC PANEL
Anion gap: 15 (ref 5–15)
BUN: 4 mg/dL — ABNORMAL LOW (ref 6–23)
CHLORIDE: 101 meq/L (ref 96–112)
CO2: 23 meq/L (ref 19–32)
CREATININE: 0.81 mg/dL (ref 0.50–1.35)
Calcium: 7.5 mg/dL — ABNORMAL LOW (ref 8.4–10.5)
GFR calc non Af Amer: >90 mL/min (ref >90)
Glucose, Bld: 124 mg/dL — ABNORMAL HIGH (ref 70–99)
POTASSIUM: 3.7 meq/L (ref 3.7–5.3)
Sodium: 139 mEq/L (ref 137–147)

## 2014-06-21 LAB — URINALYSIS, ROUTINE W REFLEX MICROSCOPIC
Bilirubin Urine: NEGATIVE
Glucose, UA: NEGATIVE mg/dL
Hgb urine dipstick: NEGATIVE
Ketones, ur: 15 mg/dL — AB
LEUKOCYTES UA: NEGATIVE
Nitrite: NEGATIVE
PROTEIN: NEGATIVE mg/dL
Specific Gravity, Urine: 1.006 (ref 1.005–1.030)
Urobilinogen, UA: 0.2 mg/dL (ref 0.0–1.0)
pH: 6 (ref 5.0–8.0)

## 2014-06-21 LAB — CBC
HEMATOCRIT: 44.7 % (ref 39.0–52.0)
HEMOGLOBIN: 16 g/dL (ref 13.0–17.0)
MCH: 33.3 pg (ref 26.0–34.0)
MCHC: 35.8 g/dL (ref 30.0–36.0)
MCV: 92.9 fL (ref 78.0–100.0)
Platelets: 177 10*3/uL (ref 150–400)
RBC: 4.81 MIL/uL (ref 4.22–5.81)
RDW: 13.2 % (ref 11.5–15.5)
WBC: 5.8 10*3/uL (ref 4.0–10.5)

## 2014-06-21 LAB — ETHANOL: ALCOHOL ETHYL (B): 322 mg/dL — AB (ref 0–11)

## 2014-06-21 MED ORDER — VITAMIN B-1 100 MG PO TABS
100.0000 mg | ORAL_TABLET | Freq: Every day | ORAL | Status: DC
  Administered 2014-06-21 – 2014-06-22 (×2): 100 mg via ORAL
  Filled 2014-06-21 (×2): qty 1

## 2014-06-21 MED ORDER — LORAZEPAM 1 MG PO TABS
2.0000 mg | ORAL_TABLET | Freq: Once | ORAL | Status: AC
Start: 2014-06-21 — End: 2014-06-21
  Administered 2014-06-21: 2 mg via ORAL
  Filled 2014-06-21: qty 2

## 2014-06-21 MED ORDER — NICOTINE 21 MG/24HR TD PT24: 21.0000 mg | MEDICATED_PATCH | Freq: Every day | TRANSDERMAL | Status: DC

## 2014-06-21 MED ORDER — IBUPROFEN 800 MG PO TABS
800.0000 mg | ORAL_TABLET | Freq: Once | ORAL | Status: AC
Start: 2014-06-21 — End: 2014-06-21
  Administered 2014-06-21: 800 mg via ORAL
  Filled 2014-06-21: qty 1

## 2014-06-21 MED ORDER — LORAZEPAM 1 MG PO TABS
1.0000 mg | ORAL_TABLET | Freq: Three times a day (TID) | ORAL | Status: DC | PRN
  Administered 2014-06-21 – 2014-06-22 (×2): 1 mg via ORAL
  Filled 2014-06-21 (×2): qty 1

## 2014-06-21 MED ORDER — LORAZEPAM 1 MG PO TABS
0.0000 mg | ORAL_TABLET | Freq: Two times a day (BID) | ORAL | Status: DC
  Administered 2014-06-21: 2 mg via ORAL

## 2014-06-21 MED ORDER — SODIUM CHLORIDE 0.9 % IV BOLUS (SEPSIS)
1000.0000 mL | Freq: Once | INTRAVENOUS | Status: AC
  Administered 2014-06-21: 1000 mL via INTRAVENOUS

## 2014-06-21 MED ORDER — ONDANSETRON HCL 4 MG PO TABS
4.0000 mg | ORAL_TABLET | Freq: Three times a day (TID) | ORAL | Status: DC | PRN
  Administered 2014-06-21 – 2014-06-22 (×4): 4 mg via ORAL
  Filled 2014-06-21 (×4): qty 1

## 2014-06-21 MED ORDER — PROMETHAZINE HCL 25 MG PO TABS
25.0000 mg | ORAL_TABLET | Freq: Once | ORAL | Status: AC
  Administered 2014-06-21: 25 mg via ORAL
  Filled 2014-06-21: qty 1

## 2014-06-21 MED ORDER — IBUPROFEN 400 MG PO TABS: 600.0000 mg | ORAL_TABLET | Freq: Three times a day (TID) | ORAL | Status: DC | PRN

## 2014-06-21 MED ORDER — LORAZEPAM 1 MG PO TABS
0.0000 mg | ORAL_TABLET | Freq: Four times a day (QID) | ORAL | Status: DC
  Administered 2014-06-21 – 2014-06-22 (×2): 1 mg via ORAL
  Filled 2014-06-21: qty 1
  Filled 2014-06-21: qty 2
  Filled 2014-06-21 (×2): qty 1

## 2014-06-21 MED ORDER — ZOLPIDEM TARTRATE 5 MG PO TABS: 5.0000 mg | ORAL_TABLET | Freq: Every evening | ORAL | Status: DC | PRN

## 2014-06-21 MED ORDER — OXYMETAZOLINE HCL 0.05 % NA SOLN
1.0000 | Freq: Once | NASAL | Status: DC
  Filled 2014-06-21: qty 15

## 2014-06-21 MED ORDER — DEXTROSE 5 % IV BOLUS
500.0000 mL | Freq: Once | INTRAVENOUS | Status: AC
  Administered 2014-06-21: 500 mL via INTRAVENOUS

## 2014-06-21 MED ORDER — THIAMINE HCL 100 MG/ML IJ SOLN: 100.0000 mg | Freq: Every day | INTRAMUSCULAR | Status: DC

## 2014-06-21 NOTE — ED Notes (Signed)
Pt wanded by security. 

## 2014-06-21 NOTE — BH Assessment (Signed)
Received call from Dr. Kathrynn Humble who requests that TTS assess Pt for alcohol detox in the morning. Pt put on TTS assessment log and TTS shift report.  Orpah Greek Rosana Hoes, [facility] Triage Specialist [phone]

## 2014-06-21 NOTE — ED Provider Notes (Signed)
CSN: [id number]     Arrival date & time 06/21/14  1526 History   First MD Initiated Contact with Patient 06/21/14 1539     Chief Complaint  Patient presents with  . Drug / Alcohol Assessment     (Consider location/radiation/quality/duration/timing/severity/associated sxs/prior Treatment) HPI Comments: Pt comes in to the ER seeking detox. Pt has heavy alcohol use for the last 2 months. He has always been a heavy drinker, but things have spiraled out of control the last 2 months. Pt had a drink just 3 hours ago, his step father came to see him, and they all talked, and he has decided to quit alcohol for the sake of his marriage, and seeking safe way to detox. No other substance abuse. PT was admitted last month for alcoholic ketosis and withdrawals. Currently, he denies any hallucinations, agitations, tactile stimulations. Pt denies nausea, emesis, fevers, chills, chest pains, shortness of breath, headaches, abdominal pain, uti like symptoms.   The history is provided by the patient.    Past Medical History  Diagnosis Date  . Anxiety   . Bipolar affective disorder   . Alcohol abuse    History reviewed. No pertinent past surgical history. History reviewed. No pertinent family history. History  Substance Use Topics  . Smoking status: Never Smoker   . Smokeless tobacco: Not on file  . Alcohol Use: Yes    Review of Systems  Constitutional: Negative for activity change and appetite change.  Respiratory: Negative for cough and shortness of breath.   Cardiovascular: Negative for chest pain.  Gastrointestinal: Negative for abdominal pain.  Genitourinary: Negative for dysuria.      Allergies  Shellfish allergy  Home Medications   Prior to Admission medications   Medication Sig Start Date End Date Taking? Authorizing Provider  ALPRAZolam Duanne Moron) 1 MG tablet Take 1 mg by mouth 2 (two) times daily as needed for anxiety.    Yes Historical Provider, MD  PARoxetine (PAXIL) 20 MG  tablet Take 20 mg by mouth every morning.   Yes Historical Provider, MD   BP 108/68  Pulse 149  Temp(Src) 98.3 F (36.8 C) (Oral)  Resp 30  Ht 5\' 5"  (1.651 m)  Wt 150 lb (68.04 kg)  BMI 24.96 kg/m2  SpO2 100% Physical Exam  Nursing note and vitals reviewed. Constitutional: He is oriented to person, place, and time. He appears well-developed.  Eyes: Conjunctivae are normal.  Neck: Neck supple.  Cardiovascular:  tachycardia  Pulmonary/Chest: Effort normal.  Abdominal: Soft. There is no tenderness.  Musculoskeletal:  Mild tremor  Neurological: He is alert and oriented to person, place, and time.    ED Course  Procedures (including critical care time) Labs Review Labs Reviewed  COMPREHENSIVE METABOLIC PANEL - Abnormal; Notable for the following:    Chloride 95 (*)    BUN 5 (*)    AST 183 (*)    ALT 85 (*)    Total Bilirubin 1.3 (*)    Anion gap 23 (*)    All other components within normal limits  ETHANOL - Abnormal; Notable for the following:    Alcohol, Ethyl (B) 322 (*)    All other components within normal limits  URINALYSIS, ROUTINE W REFLEX MICROSCOPIC - Abnormal; Notable for the following:    Ketones, ur 15 (*)    All other components within normal limits  BASIC METABOLIC PANEL - Abnormal; Notable for the following:    Glucose, Bld 124 (*)    BUN 4 (*)  Calcium 7.5 (*)    All other components within normal limits  CBC  URINE RAPID DRUG SCREEN (HOSP PERFORMED)    Imaging Review No results found.   EKG Interpretation   Date/Time:  Friday June 21 2014 20:19:44 EDT Ventricular Rate:  107 PR Interval:  139 QRS Duration: 104 QT Interval:  334 QTC Calculation: 446 R Axis:   53 Text Interpretation:  Age not entered, assumed to be  33 years old for  purpose of ECG interpretation Sinus tachycardia Borderline low voltage,  extremity leads Confirmed by Kathrynn Humble, MD, Thelma Comp [phone]) on 06/21/2014  8:26:23 PM      MDM   Final diagnoses:  Alcohol abuse     PT comes in with cc of etoh abuse and wanting safe detox. Pt is tachycardic, and there is mild tremor. No other red flags for severe withdrawals, and with his last drink 3 hours ago, we really don't have any concerns for DTs.   Labs show etoh at 322. Pt also had a gap of 23, with normal bicarb. Pt was given 2 liters of ivf, and some dextrose, and his gap is now normal.  Although etoh is high, there might be an element of withdrawals, or other substance use.   Will continue to monitor closely.    Varney Biles, MD 06/21/14 2028

## 2014-06-21 NOTE — ED Notes (Addendum)
Presents requesting alcohol detox, was here one month ago for same. Last drink 2 hours ago reports drinking 4 beers today, usually drinks more than 10 drinks a day. Denies drug use. Prescribed xanax. Pt reports feeling nauseated and is tearful.  Hr 122. Denies visual and auditory hallucinations. Mild tremors noted.

## 2014-06-21 NOTE — ED Notes (Signed)
Drinks 8-10 beers a day; does drink vodka; here because increased drinking; pt. Having marital issues; prior to his binge drinking, pt. Normally drinks 4 beers every so often.

## 2014-06-21 NOTE — ED Notes (Signed)
Pt placed into blue scrubs. Security paged to wand pt.

## 2014-06-22 ENCOUNTER — Encounter (HOSPITAL_COMMUNITY): Payer: Self-pay | Admitting: *Deleted

## 2014-06-22 ENCOUNTER — Inpatient Hospital Stay (HOSPITAL_COMMUNITY)
Admission: AD | Admit: 2014-06-22 | Discharge: 2014-06-25 | DRG: 880 | Disposition: A | Discharge status: Home / Self Care | Payer: Commercial Managed Care - PPO | Source: Intra-hospital | Attending: Psychiatry | Admitting: Psychiatry

## 2014-06-22 DIAGNOSIS — Y900 Blood alcohol level of less than 20 mg/100 ml: Secondary | ICD-10-CM | POA: Diagnosis present

## 2014-06-22 DIAGNOSIS — F411 Generalized anxiety disorder: Secondary | ICD-10-CM | POA: Diagnosis present

## 2014-06-22 DIAGNOSIS — G4700 Insomnia, unspecified: Secondary | ICD-10-CM | POA: Diagnosis present

## 2014-06-22 DIAGNOSIS — Z91013 Allergy to seafood: Secondary | ICD-10-CM | POA: Diagnosis not present

## 2014-06-22 DIAGNOSIS — F1010 Alcohol abuse, uncomplicated: Secondary | ICD-10-CM | POA: Diagnosis not present

## 2014-06-22 DIAGNOSIS — F10239 Alcohol dependence with withdrawal, unspecified: Secondary | ICD-10-CM | POA: Diagnosis present

## 2014-06-22 DIAGNOSIS — F10939 Alcohol use, unspecified with withdrawal, unspecified: Secondary | ICD-10-CM

## 2014-06-22 DIAGNOSIS — Z599 Problem related to housing and economic circumstances, unspecified: Secondary | ICD-10-CM | POA: Diagnosis not present

## 2014-06-22 DIAGNOSIS — F1024 Alcohol dependence with alcohol-induced mood disorder: Secondary | ICD-10-CM

## 2014-06-22 DIAGNOSIS — F1020 Alcohol dependence, uncomplicated: Secondary | ICD-10-CM | POA: Diagnosis present

## 2014-06-22 LAB — ETHANOL

## 2014-06-22 MED ORDER — LOPERAMIDE HCL 2 MG PO CAPS: 2.0000 mg | ORAL_CAPSULE | ORAL | Status: DC | PRN

## 2014-06-22 MED ORDER — ALUM & MAG HYDROXIDE-SIMETH 200-200-20 MG/5ML PO SUSP: 30.0000 mL | ORAL | Status: DC | PRN

## 2014-06-22 MED ORDER — LORAZEPAM 1 MG PO TABS
1.0000 mg | ORAL_TABLET | Freq: Two times a day (BID) | ORAL | Status: DC
  Administered 2014-06-25: 1 mg via ORAL
  Filled 2014-06-22: qty 1

## 2014-06-22 MED ORDER — VITAMIN B-1 100 MG PO TABS
100.0000 mg | ORAL_TABLET | Freq: Every day | ORAL | Status: DC
  Administered 2014-06-23 – 2014-06-25 (×3): 100 mg via ORAL
  Filled 2014-06-22 (×5): qty 1

## 2014-06-22 MED ORDER — LORAZEPAM 1 MG PO TABS
1.0000 mg | ORAL_TABLET | Freq: Once | ORAL | Status: AC
  Administered 2014-06-22: 1 mg via ORAL

## 2014-06-22 MED ORDER — LORAZEPAM 1 MG PO TABS: 1.0000 mg | ORAL_TABLET | Freq: Every day | ORAL | Status: DC

## 2014-06-22 MED ORDER — ONDANSETRON 4 MG PO TBDP
4.0000 mg | ORAL_TABLET | Freq: Four times a day (QID) | ORAL | Status: DC | PRN
  Administered 2014-06-23: 4 mg via ORAL
  Filled 2014-06-22: qty 1

## 2014-06-22 MED ORDER — LORAZEPAM 1 MG PO TABS
1.0000 mg | ORAL_TABLET | Freq: Four times a day (QID) | ORAL | Status: DC | PRN
  Administered 2014-06-23 – 2014-06-25 (×3): 1 mg via ORAL
  Filled 2014-06-22 (×3): qty 1

## 2014-06-22 MED ORDER — PAROXETINE HCL 20 MG PO TABS
20.0000 mg | ORAL_TABLET | Freq: Every day | ORAL | Status: DC
  Administered 2014-06-23: 20 mg via ORAL
  Filled 2014-06-22 (×2): qty 1

## 2014-06-22 MED ORDER — PAROXETINE HCL 20 MG PO TABS
20.0000 mg | ORAL_TABLET | Freq: Every morning | ORAL | Status: DC
  Administered 2014-06-22: 20 mg via ORAL
  Filled 2014-06-22: qty 1

## 2014-06-22 MED ORDER — LORAZEPAM 1 MG PO TABS
1.0000 mg | ORAL_TABLET | Freq: Three times a day (TID) | ORAL | Status: AC
  Administered 2014-06-24 (×3): 1 mg via ORAL
  Filled 2014-06-22 (×3): qty 1

## 2014-06-22 MED ORDER — ADULT MULTIVITAMIN W/MINERALS CH
1.0000 | ORAL_TABLET | Freq: Every day | ORAL | Status: DC
Start: 2014-06-22 — End: 2014-06-25
  Administered 2014-06-22 – 2014-06-25 (×4): 1 via ORAL
  Filled 2014-06-22 (×2): qty 1
  Filled 2014-06-22: qty 4
  Filled 2014-06-22 (×4): qty 1

## 2014-06-22 MED ORDER — ACETAMINOPHEN 325 MG PO TABS: 650.0000 mg | ORAL_TABLET | Freq: Four times a day (QID) | ORAL | Status: DC | PRN

## 2014-06-22 MED ORDER — HYDROXYZINE HCL 25 MG PO TABS
25.0000 mg | ORAL_TABLET | Freq: Four times a day (QID) | ORAL | Status: DC | PRN
  Administered 2014-06-22: 25 mg via ORAL
  Filled 2014-06-22: qty 1

## 2014-06-22 MED ORDER — LORAZEPAM 1 MG PO TABS
1.0000 mg | ORAL_TABLET | Freq: Four times a day (QID) | ORAL | Status: AC
  Administered 2014-06-22 – 2014-06-23 (×6): 1 mg via ORAL
  Filled 2014-06-22 (×6): qty 1

## 2014-06-22 MED ORDER — MAGNESIUM HYDROXIDE 400 MG/5ML PO SUSP: 30.0000 mL | Freq: Every day | ORAL | Status: DC | PRN

## 2014-06-22 NOTE — BH Assessment (Signed)
[place] Assessment Progress Note  Pt accepted to [facility] 300-1 by Reginold Agent to Dr. Sabra Heck.  ED staff will complete and fax voluntary paperwork and pt will be transported by [facility].

## 2014-06-22 NOTE — Tx Team (Signed)
Initial Interdisciplinary Treatment Plan   PATIENT STRESSORS: Substance abuse   PROBLEM LIST: Problem List/Patient Goals Date to be addressed Date deferred Reason deferred Estimated date of resolution  Substance abuse 06/22/14                                                      DISCHARGE CRITERIA:  Improved stabilization in mood, thinking, and/or behavior Withdrawal symptoms are absent or subacute and managed without 24-hour nursing intervention  PRELIMINARY DISCHARGE PLAN: Return to previous living arrangement  PATIENT/FAMIILY INVOLVEMENT: This treatment plan has been presented to and reviewed with the patient, Chad Mclaughlin, and/or family member.  The patient and family have been given the opportunity to ask questions and make suggestions.  [name] [name] 06/22/2014, 4:32 PM

## 2014-06-22 NOTE — ED Notes (Signed)
Pt signed consent form - faxed to [facility].

## 2014-06-22 NOTE — Progress Notes (Signed)
Patient ID: Chad Mclaughlin, male   DOB: 1981/08/28, 33 y.o.   MRN: [id number]   33 year old white male admitted after he presented to [facility] requested detox from alcohol. Pt reported that he had been drinking several beers daily, as well as 10 shots of vodka daily. Pt reported that earlier this week he was admitted to the hospital for alcohol abuse. Pt reported that he did not really want to be admitted to [facility], but agreed because he thought that inpt treatment would help him. At time of admission pt reported that he did not want to be in the hospital, and that he wanted to be discharged. Pt was advised that he could not be discharged and that he would have to see doctor in the morning. Pt was having severe withdrawals, his CIWA was a 8. Pt reported being negative SI/HI, no AH/VH noted.

## 2014-06-22 NOTE — Progress Notes (Signed)
Patient did attend the evening speaker AA meeting.  

## 2014-06-22 NOTE — BH Assessment (Signed)
Tele Assessment Note   Chad Mclaughlin is an 33 y.o. male who works at [employer] who came to [facility] seeking detox/ treatment for alcohol abuse. Pt is currently drinking 2 beers plus about 10 shots of vodka/day.  His last drink was at 2pm yesterday.  Pt denies hx of seizures, but c/o sweats, shakiness and weakness currently. Pt said that one month ago, he drank heavily over the course of a weekend and did not eat all day on Sunday, and then came to the hospital Monday morning vomiting every few minutes and was medically admitted to the hospital.   He says he is now having to drink in the am to treat his symptoms of withdrawals.  Pt says he sees a psychiatrist for anxiety and takes Paxil and Ativan PRN (about 3 x/week).  His stressors include paying off some bills and stress at work where people have been laid off.  He describes his parents and his wife as supportive.  Pt was cooperative and pleasant during interview. He had good eye contact and casual appearance. Pt denies depression, SI, HI, A/V hallucinations and does not appear to be responding to internal stimuli.  Pt has normal movement.  Spoke with Reginold Agent, NP, who says that pt meets IP criteria for detox.  [facility] has no appropriate beds, so TTS will seek placement.  Axis I: Alcohol Abuse Axis II: Deferred Axis III:  Past Medical History  Diagnosis Date  . Anxiety   . Bipolar affective disorder   . Alcohol abuse    Axis IV: other psychosocial or environmental problems Axis V: 41-50 serious symptoms  Past Medical History:  Past Medical History  Diagnosis Date  . Anxiety   . Bipolar affective disorder   . Alcohol abuse     History reviewed. No pertinent past surgical history.  Family History: History reviewed. No pertinent family history.  Social History:  reports that he has never smoked. He does not have any smokeless tobacco history on file. He reports that he drinks alcohol. He reports that he does not use illicit  drugs.  Additional Social History:  Alcohol / Drug Use Pain Medications: denies Prescriptions: denies Over the Counter: denies History of alcohol / drug use?: Yes Longest period of sobriety (when/how long): 1 week Negative Consequences of Use: Legal;Personal relationships;Work / Youth worker Withdrawal Symptoms: Nausea / Vomiting;Sweats;Tremors Substance #1 Name of Substance 1: alcohol 1 - Age of First Use: 22 1 - Amount (size/oz): 2 beers, 10 shots/day 1 - Frequency: daily 1 - Duration: years 1 - Last Use / Amount: yesterday at 2pm 4 beers  CIWA: CIWA-Ar BP: 132/79 mmHg Pulse Rate: 90 Nausea and Vomiting: mild nausea with no vomiting Tactile Disturbances: none Tremor: two Auditory Disturbances: not present Paroxysmal Sweats: no sweat visible Visual Disturbances: not present Anxiety: no anxiety, at ease Headache, Fullness in Head: none present Agitation: normal activity Orientation and Clouding of Sensorium: oriented and can do serial additions CIWA-Ar Total: 3 COWS:    PATIENT STRENGTHS: (choose at least two) Ability for insight Active sense of humor Average or above average intelligence Capable of independent living Communication skills Financial means General fund of knowledge Motivation for treatment/growth Supportive family/friends Work skills  Allergies:  Allergies  Allergen Reactions  . Shellfish Allergy Anaphylaxis    Home Medications:  (Not in a hospital admission)  OB/GYN Status:  No LMP for male patient.  General Assessment Data Location of Assessment: [facility] Is this a Tele or Face-to-Face Assessment?: Tele Assessment Is  this an Initial Assessment or a Re-assessment for this encounter?: Initial Assessment Living Arrangements: Spouse/significant other Can pt return to current living arrangement?: Yes Admission Status: Voluntary Is patient capable of signing voluntary admission?: Yes Transfer from: Home Referral Source: Self/Family/Friend      [place] Living Arrangements: Spouse/significant other Name of Psychiatrist:  (Diane)  Education Status Is patient currently in school?: No  Risk to self with the past 6 months Suicidal Ideation: No Suicidal Intent: No Is patient at risk for suicide?: No Suicidal Plan?: No Access to Means: No What has been your use of drugs/alcohol within the last 12 months?:  (saa SA) Previous Attempts/Gestures: No Other Self Harm Risks:  (none) Intentional Self Injurious Behavior: None Family Suicide History: No Recent stressful life event(s): Financial Problems (job stress, layoffs, bills) Persecutory voices/beliefs?: No Depression: No Depression Symptoms: Feeling angry/irritable Substance abuse history and/or treatment for substance abuse?: Yes Suicide prevention information given to non-admitted patients: Not applicable  Risk to Others within the past 6 months Homicidal Ideation: No Thoughts of Harm to Others: No Current Homicidal Intent: No Current Homicidal Plan: No Access to Homicidal Means: No History of harm to others?: No Assessment of Violence: None Noted Does patient have access to weapons?: Yes (Comment) (shotgun) Criminal Charges Pending?: No Does patient have a court date: No  Psychosis Hallucinations: None noted Delusions: None noted  Mental Status Report Appear/Hygiene: Unremarkable;In scrubs Eye Contact: Good Motor Activity: Unremarkable Speech: Logical/coherent Level of Consciousness: Alert Mood: Pleasant Affect: Appropriate to circumstance Anxiety Level: Moderate Thought Processes: Coherent;Relevant Judgement: Partial Orientation: Person;Place;Time;Situation Obsessive Compulsive Thoughts/Behaviors: None  Cognitive Functioning Concentration: Good Memory: Recent Intact;Remote Intact IQ: Average Insight: Fair Impulse Control: Fair Appetite: Good Weight Loss: 0 Weight Gain: 0 Sleep: No Change Total Hours of Sleep: 8 Vegetative  Symptoms: None  ADLScreening [facility] Assessment Services) Patient's cognitive ability adequate to safely complete daily activities?: Yes Patient able to express need for assistance with ADLs?: Yes Independently performs ADLs?: Yes (appropriate for developmental age)  Prior Inpatient Therapy Prior Inpatient Therapy: No  Prior Outpatient Therapy Prior Outpatient Therapy: Yes Prior Therapy Dates:  Fraser Din)  ADL Screening (condition at time of admission) Patient's cognitive ability adequate to safely complete daily activities?: Yes Is the patient deaf or have difficulty hearing?: No Does the patient have difficulty seeing, even when wearing glasses/contacts?: No Does the patient have difficulty concentrating, remembering, or making decisions?: No Patient able to express need for assistance with ADLs?: Yes Does the patient have difficulty dressing or bathing?: No Independently performs ADLs?: Yes (appropriate for developmental age) Does the patient have difficulty walking or climbing stairs?: No  Home Assistive Devices/Equipment Home Assistive Devices/Equipment: None    Abuse/Neglect Assessment (Assessment to be complete while patient is alone) Physical Abuse: Denies Verbal Abuse: Denies Sexual Abuse: Denies Exploitation of patient/patient's resources: Denies Self-Neglect: Denies Values / Beliefs Cultural Requests During Hospitalization: None Spiritual Requests During Hospitalization: None   Advance Directives (For Healthcare) Does patient have an advance directive?: No Would patient like information on creating an advanced directive?: Yes - Educational materials given    Additional Information 1:1 In Past 12 Months?: No CIRT Risk: No Elopement Risk: No Does patient have medical clearance?: Yes     Disposition:  Disposition Initial Assessment Completed for this Encounter: Yes Disposition of Patient: Inpatient treatment program  [facility] 06/22/2014 9:03  AM

## 2014-06-22 NOTE — ED Provider Notes (Signed)
Medical screening examination/treatment/procedure(s) were performed by non-physician practitioner and as supervising physician I was immediately available for consultation/collaboration.  Leota Jacobsen, MD 06/22/14 [phone]

## 2014-06-22 NOTE — ED Notes (Signed)
ETOH ordered as per [facility]'s request.

## 2014-06-22 NOTE — ED Notes (Signed)
Attempted to call report to [facility].

## 2014-06-22 NOTE — ED Provider Notes (Signed)
Pt rechecked before transfer to Behavioral health transfer.  Pt feeling better,  Vitals stable,  Pt expresses no concerns,  Eating drinking normally,    Fransico Meadow, [place] 06/22/14 1356

## 2014-06-23 DIAGNOSIS — F1994 Other psychoactive substance use, unspecified with psychoactive substance-induced mood disorder: Secondary | ICD-10-CM

## 2014-06-23 MED ORDER — PAROXETINE HCL 30 MG PO TABS: 30.0000 mg | ORAL_TABLET | Freq: Every day | ORAL | Status: DC

## 2014-06-23 MED ORDER — LAMOTRIGINE 25 MG PO TABS
25.0000 mg | ORAL_TABLET | Freq: Every day | ORAL | Status: DC
  Administered 2014-06-23 – 2014-06-25 (×3): 25 mg via ORAL
  Filled 2014-06-23: qty 1
  Filled 2014-06-23: qty 4
  Filled 2014-06-23 (×4): qty 1

## 2014-06-23 MED ORDER — PAROXETINE HCL 20 MG PO TABS
20.0000 mg | ORAL_TABLET | Freq: Every day | ORAL | Status: DC
  Administered 2014-06-24 – 2014-06-25 (×2): 20 mg via ORAL
  Filled 2014-06-23: qty 1
  Filled 2014-06-23: qty 4
  Filled 2014-06-23 (×2): qty 1

## 2014-06-23 NOTE — Progress Notes (Signed)
Patient did attend the evening speaker AA meeting.  

## 2014-06-23 NOTE — BHH Suicide Risk Assessment (Signed)
Suicide Risk Assessment  Admission Assessment     Nursing information obtained from:  Patient Demographic factors:  Male;Caucasian Current Mental Status:  NA Loss Factors:  NA Historical Factors:  NA Risk Reduction Factors:  Sense of responsibility to family Total Time spent with patient: 1 hour  CLINICAL FACTORS:   Depression:   Anhedonia Comorbid alcohol abuse/dependence Hopelessness Dysthymia Alcohol/Substance Abuse/Dependencies  Psychiatric Specialty Exam:     Blood pressure 145/104, pulse 114, temperature 98.2 F (36.8 C), temperature source Oral, resp. rate 20, height 5\' 5"  (1.651 m), weight 155 lb (70.308 kg), SpO2 99.00%.Body mass index is 25.79 kg/(m^2).  General Appearance: Casual and Guarded  Eye Contact::  Minimal  Speech:  Slow  Volume:  Decreased  Mood:  Dysphoric  Affect:  Congruent  Thought Process:  Linear  Orientation:  Full (Time, Place, and Person)  Thought Content:  Rumination  Suicidal Thoughts:  No  Homicidal Thoughts:  No  Memory:  Recent;   Fair  Judgement:  Poor  Insight:  Shallow  Psychomotor Activity:  Decreased  Concentration:  Fair  Recall:  [place]: Fair  Akathisia:  Negative  Handed:  Right  AIMS (if indicated):     Assets:  Armed forces logistics/support/administrative officer Physical Health Social Support Vocational/Educational  Sleep:  Number of Hours: 5   Musculoskeletal: Strength & Muscle Tone: within normal limits Gait & Station: normal Patient leans: [name]  COGNITIVE FEATURES THAT CONTRIBUTE TO RISK:  Closed-mindedness Polarized thinking    SUICIDE RISK:   Moderate:  Frequent suicidal ideation with limited intensity, and duration, some specificity in terms of plans, no associated intent, good self-control, limited dysphoria/symptomatology, some risk factors present, and identifiable protective factors, including available and accessible social support.  PLAN OF CARE:  I certify that inpatient services furnished can  reasonably be expected to improve the patient's condition.  Chad Mclaughlin 06/23/2014, 10:03 AM

## 2014-06-23 NOTE — BHH Counselor (Signed)
Adult Comprehensive Assessment  Patient ID: Chad Mclaughlin, male   DOB: 1980-10-18, 32 y.o.   MRN: [id number]  Information Source: Information source: Patient  Current Stressors:  Educational / Learning stressors: NA Employment / Job issues: Pt expresses concern re multiple company layoffs of late and states he does want to loose his position Family Relationships: NA Financial / Lack of resources (include bankruptcy): Pt reports "at times I will stress over student loans and bills but really all is manageable" Housing / Lack of housing: NA Physical health (include injuries & life threatening diseases): NA Social relationships: "Good friends yet often times our purpose for socializing is alcohol" Substance abuse: Ongoing issues w alcohol  Bereavement / Loss: NA  Living/Environment/Situation:  Living Arrangements: Spouse/significant other Living conditions (as described by patient or guardian): Nice home How long has patient lived in current situation?: several years What is atmosphere in current home: Comfortable;Loving;Supportive  Family History:  Marital status: Married Number of Years Married: 1 What types of issues is patient dealing with in the relationship?: Concern re patient's alcohol use Additional relationship information: Otherwise wife is supportive Does patient have children?: No  Childhood History:  By whom was/is the patient raised?: Mother;Father Additional childhood history information: Patient reports time was shared between parents, most of time was spent with father Description of patient's relationship with caregiver when they were a child: "fine with both" Patient's description of current relationship with people who raised him/her: Very good; close with both Does patient have siblings?: No Did patient suffer any verbal/emotional/physical/sexual abuse as a child?: No Did patient suffer from severe childhood neglect?: No Has patient ever been sexually  abused/assaulted/raped as an adolescent or adult?: No Was the patient ever a victim of a crime or a disaster?: No Witnessed domestic violence?: No Has patient been effected by domestic violence as an adult?: No  Education:  Highest grade of school patient has completed: Completed Counsellor in [place] Currently a Ship broker?: No Learning disability?: No  Employment/Work Situation:   Employment situation: Employed Where is patient currently employed?: [employer] How long has patient been employed?: 3 years Patient's job has been impacted by current illness: No (Patient reports some difficult days yet believes he has kept up w work) What is the longest time patient has a held a job?: Current position Has patient ever been in the [employer]?: No Has patient ever served in combat?: No  Financial Resources:   Financial resources: Income from employment;Income from spouse Does patient have a representative payee or guardian?: No  Alcohol/Substance Abuse:   What has been your use of drugs/alcohol within the last 12 months?: 2-4 beers and 8-10 shots of Vodka on daily basis Alcohol/Substance Abuse Treatment Hx: Past detox If yes, describe treatment: Medical detox in September 2015 after three days of heavier than usual drinking Has alcohol/substance abuse ever caused legal problems?: Yes  Social Support System:   Patient's Community Support System: Good Describe Community Support System: Wife and parents Type of faith/religion: NA  Leisure/Recreation:   Leisure and Hobbies: Enjoys Cytogeneticist; working out and cooking  Strengths/Needs:   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 Patient open to enrolling in an outpatient program) Does patient have financial barriers related to discharge medications?: No  Summary/Recommendations:   Summary and Recommendations (to be completed by the evaluator): Patient is a 33 YO employed married Caucasian male admitted with diagnosis of Alcohol Abuse.   Patient would benefit from crisis stabilization, medication evaluation, therapy groups for processing thoughts/feelings/experiences, psycho ed groups for increasing coping skills, and aftercare planning. Discharge Process and Patient Expectations information sheet signed by patient, witnessed by writer and inserted in patient's shadow chart. Patient is interested in entering an outpatient substance abuse program following detox.   Lyla Glassing. 06/23/2014

## 2014-06-23 NOTE — Progress Notes (Signed)
Psychoeducational Group Note  Date:  06/23/2014 Time:  4098-1191 Group Topic/Focus:  Making Healthy Choices:   The focus of this group is to help patients identify negative/unhealthy choices they were using prior to admission and identify positive/healthier coping strategies to replace them upon discharge.  Participation Level:  Active  Participation Quality:  Appropriate  Affect:  Appropriate  Cognitive:  Alert  Insight:  Engaged  Engagement in Group:  Engaged  Additional Comments:    Lauralyn Primes 5:04 PM. 06/23/2014

## 2014-06-23 NOTE — H&P (Signed)
Psychiatric Admission Assessment Adult  Patient Identification:  Chad Mclaughlin Date of Evaluation:  06/23/2014 Chief Complaint:  ETOH DEPENDENCE History of Present Illness::   Chad Mclaughlin is an 33 y.o. male who works at [employer] who came to [facility] seeking detox/ treatment for alcohol abuse. He reports his wife and step-father  "Made him come".  Pt is currently drinking 2 beers plus about 10 shots of vodka/day. Pt denies hx of seizures, but c/o sweats, shakiness and weakness currently.   Pt said that one month ago, he drank heavily over the course of a weekend and did not eat all day on Sunday, and then came to the hospital Monday morning vomiting every few minutes and was medically admitted to the hospital. He reports staying in the ED for a few days.  He says he is now having to drink in the am to treat his symptoms of withdrawals.  Pt says he sees a psychiatrist (Dr Ulyses Jarred) for anxiety and takes Paxil and Ativan PRN (about 3 x/week).   His stressors include paying off some bills and stress at work where people have been laid off. He describes his parents and his wife as supportive.   Pt denies  SI, HI, A/V hallucinations and does not appear to be responding to internal stimuli.   During today's assessment he is tearful and  trembling   Elements:  Location:  worsening depression and increased intake. Quality:  feelings of guilt and worthlessness. Severity:  severe. Timing:  worsenting over the past years and more in past 6 months. Duration:  chronic. Context:  "I know I can beat this"  I have to get back to work. Associated Signs/Synptoms: Depression Symptoms:  depressed mood, feelings of worthlessness/guilt, anxiety, loss of energy/fatigue, disturbed sleep, (Hypo) Manic Symptoms: This is not current but had episodes in the past, while in college.  Elevated Mood, Financial Extravagance, Irritable Mood, Anxiety Symptoms:  Excessive Worry, Psychotic Symptoms: NA PTSD  Symptoms:NA Total Time spent with patient: 45 minutes  Psychiatric Specialty Exam: Physical Exam  Constitutional: He is oriented to person, place, and time. He appears well-developed and well-nourished.  HENT:  Head: Normocephalic.  Neck: Normal range of motion. Neck supple.  Musculoskeletal: Normal range of motion.  Neurological: He is alert and oriented to person, place, and time.  Skin: Skin is warm and dry.    ROS  Blood pressure 145/104, pulse 114, temperature 98.2 F (36.8 C), temperature source Oral, resp. rate 20, height 5' 5"  (1.651 m), weight 70.308 kg (155 lb), SpO2 99.00%.Body mass index is 25.79 kg/(m^2).  General Appearance: Disheveled  Eye Sport and exercise psychologist::  Fair  Speech:  Slow and Slurred, shacky  Volume:  Decreased  Mood:  Anxious, Depressed and Hopeless  Affect:  Depressed and Flat  Thought Process:  Coherent  Orientation:  Full (Time, Place, and Person)  Thought Content:  NA  Suicidal Thoughts:  No  Homicidal Thoughts:  No  Memory:  Immediate;   Fair Recent;   Fair Remote;   Fair  Judgement:  fair  Insight:  Fair  Psychomotor Activity:  Tremor  Concentration:  Fair  Recall:  [place]  Language: Fair  Akathisia:  Negative  Handed:  Left  AIMS (if indicated):     Assets:  Communication Skills Desire for [employer] Talents/Skills Vocational/Educational  Sleep:  Number of Hours: 5    Musculoskeletal: Strength & Muscle Tone: decreased Gait & Station: normal Patient leans: [name]  Past Psychiatric History: Diagnosis: MDD Alcohol abuse  Hospitalizations:yes  One month ago at [facility] ER  Outpatient Care: none  Substance Abuse Care: none  Self-Mutilation:none  Suicidal Attempts:none  Violent Behaviors:none   Past Medical History:   Past Medical History  Diagnosis Date  . Anxiety   . Bipolar affective disorder   . Alcohol abuse    None. Allergies:   Allergies  Allergen Reactions   . Shellfish Allergy Anaphylaxis   PTA Medications: Prescriptions prior to admission  Medication Sig Dispense Refill  . ALPRAZolam (XANAX) 1 MG tablet Take 1 mg by mouth 2 (two) times daily as needed for anxiety.       Marland Kitchen PARoxetine (PAXIL) 20 MG tablet Take 20 mg by mouth every morning.        Previous Psychotropic Medications:  Medication/Dose      See MAR           Substance Abuse History in the last 12 months:  Yes.    Drinks daily but excessive 4 days/week  Consequences of Substance Abuse: Family Consequences:  affecting his marriage and other family relationship  Social History:  reports that he has never smoked. He does not have any smokeless tobacco history on file. He reports that he drinks alcohol. He reports that he does not use illicit drugs. Additional Social History: Pain Medications: none noted Prescriptions: none noted Over the Counter: none noted History of alcohol / drug use?: Yes Negative Consequences of Use: Financial;Personal relationships                    Current Place of Residence:   Place of Birth:   Family Members: Marital Status:  Married Children:  Sons:  Daughters: Relationships: Education:  Immunologist in [place] Educational Problems/Performance: Religious Beliefs/Practices: none History of Abuse (Emotional/Phsycial/Sexual) Ship broker History:  None. Legal History: Hobbies/Interests:  Family History:  History reviewed. No pertinent family history.  Results for orders placed during the hospital encounter of 06/21/14 (from the past 72 hour(s))  CBC     Status: None   Collection Time    06/21/14  3:45 PM      Result Value Ref Range   WBC 5.8  4.0 - 10.5 K/uL   RBC 4.81  4.22 - 5.81 MIL/uL   Hemoglobin 16.0  13.0 - 17.0 g/dL   HCT 44.7  39.0 - 52.0 %   MCV 92.9  78.0 - 100.0 fL   MCH 33.3  26.0 - 34.0 pg   MCHC 35.8  30.0 - 36.0 g/dL   RDW 13.2  11.5 - 15.5 %   Platelets 177  150 -  400 K/uL  COMPREHENSIVE METABOLIC PANEL     Status: Abnormal   Collection Time    06/21/14  3:45 PM      Result Value Ref Range   Sodium 139  137 - 147 mEq/L   Potassium 4.1  3.7 - 5.3 mEq/L   Chloride 95 (*) 96 - 112 mEq/L   CO2 21  19 - 32 mEq/L   Glucose, Bld 96  70 - 99 mg/dL   BUN 5 (*) 6 - 23 mg/dL   Creatinine, Ser 0.81  0.50 - 1.35 mg/dL   Calcium 8.9  8.4 - 10.5 mg/dL   Total Protein 7.4  6.0 - 8.3 g/dL   Albumin 4.4  3.5 - 5.2 g/dL   AST 183 (*) 0 - 37 U/L   ALT 85 (*) 0 - 53 U/L  Alkaline Phosphatase 57  39 - 117 U/L   Total Bilirubin 1.3 (*) 0.3 - 1.2 mg/dL   GFR calc non Af Amer >90  >90 mL/min   GFR calc Af Amer >90  >90 mL/min   Comment: (NOTE)     The eGFR has been calculated using the CKD EPI equation.     This calculation has not been validated in all clinical situations.     eGFR's persistently <90 mL/min signify possible Chronic Kidney     Disease.   Anion gap 23 (*) 5 - 15  ETHANOL     Status: Abnormal   Collection Time    06/21/14  3:45 PM      Result Value Ref Range   Alcohol, Ethyl (B) 322 (*) 0 - 11 mg/dL   Comment:            LOWEST DETECTABLE LIMIT FOR     SERUM ALCOHOL IS 11 mg/dL     FOR MEDICAL PURPOSES ONLY  URINE RAPID DRUG SCREEN (HOSP PERFORMED)     Status: None   Collection Time    06/21/14  4:05 PM      Result Value Ref Range   Opiates NONE DETECTED  NONE DETECTED   Cocaine NONE DETECTED  NONE DETECTED   Benzodiazepines NONE DETECTED  NONE DETECTED   Amphetamines NONE DETECTED  NONE DETECTED   Tetrahydrocannabinol NONE DETECTED  NONE DETECTED   Barbiturates NONE DETECTED  NONE DETECTED   Comment:            DRUG SCREEN FOR MEDICAL PURPOSES     ONLY.  IF CONFIRMATION IS NEEDED     FOR ANY PURPOSE, NOTIFY LAB     WITHIN 5 DAYS.                LOWEST DETECTABLE LIMITS     FOR URINE DRUG SCREEN     Drug Class       Cutoff (ng/mL)     Amphetamine      1000     Barbiturate      200     Benzodiazepine   470     Tricyclics        962     Opiates          300     Cocaine          300     THC              50  URINALYSIS, ROUTINE W REFLEX MICROSCOPIC     Status: Abnormal   Collection Time    06/21/14  4:05 PM      Result Value Ref Range   Color, Urine YELLOW  YELLOW   APPearance CLEAR  CLEAR   Specific Gravity, Urine 1.006  1.005 - 1.030   pH 6.0  5.0 - 8.0   Glucose, UA NEGATIVE  NEGATIVE mg/dL   Hgb urine dipstick NEGATIVE  NEGATIVE   Bilirubin Urine NEGATIVE  NEGATIVE   Ketones, ur 15 (*) NEGATIVE mg/dL   Protein, ur NEGATIVE  NEGATIVE mg/dL   Urobilinogen, UA 0.2  0.0 - 1.0 mg/dL   Nitrite NEGATIVE  NEGATIVE   Leukocytes, UA NEGATIVE  NEGATIVE   Comment: MICROSCOPIC NOT DONE ON URINES WITH NEGATIVE PROTEIN, BLOOD, LEUKOCYTES, NITRITE, OR GLUCOSE <1000 mg/dL.  BASIC METABOLIC PANEL     Status: Abnormal   Collection Time    06/21/14  7:15 PM      Result Value  Ref Range   Sodium 139  137 - 147 mEq/L   Potassium 3.7  3.7 - 5.3 mEq/L   Chloride 101  96 - 112 mEq/L   CO2 23  19 - 32 mEq/L   Glucose, Bld 124 (*) 70 - 99 mg/dL   BUN 4 (*) 6 - 23 mg/dL   Creatinine, Ser 0.81  0.50 - 1.35 mg/dL   Calcium 7.5 (*) 8.4 - 10.5 mg/dL   GFR calc non Af Amer >90  >90 mL/min   GFR calc Af Amer >90  >90 mL/min   Comment: (NOTE)     The eGFR has been calculated using the CKD EPI equation.     This calculation has not been validated in all clinical situations.     eGFR's persistently <90 mL/min signify possible Chronic Kidney     Disease.   Anion gap 15  5 - 15  ETHANOL     Status: None   Collection Time    06/22/14  1:15 PM      Result Value Ref Range   Alcohol, Ethyl (B) <11  0 - 11 mg/dL   Comment:            LOWEST DETECTABLE LIMIT FOR     SERUM ALCOHOL IS 11 mg/dL     FOR MEDICAL PURPOSES ONLY   Psychological Evaluations:  Assessment:   DSM5:  Schizophrenia Disorders: NA Obsessive-Compulsive Disorders:  NA Trauma-Stressor Disorders:  NA Substance/Addictive Disorders:  Alcohol Related Disorder -  Severe (303.90) and Alcohol Withdrawal without Perceptual Disturbances (F10.239) Depressive Disorders:  Major Depressive Disorder - Severe (296.23)  AXIS I:  Substance Induced Mood Disorder AXIS II:  Deferred AXIS III:   Past Medical History  Diagnosis Date  . Anxiety   . Bipolar affective disorder   . Alcohol abuse    AXIS IV:  economic problems, occupational problems and other psychosocial or environmental problems AXIS V:  41-50 serious symptoms  Treatment Plan/Recommendations:   Treatment Plan Summary: Review of chart, vital signs, medications and notes Daily contact with the patient to assess and evaluate synmptoms and progress in treatment  Plan: 1. Continue crisis management and stabilization. Estimated length of stay 5-7 days  2.  Medication management to reduce current symptoms to base line and improve patient's overall level of functioning -Ativan detox protocol -add Lamictal 25 mg daily may need to increase dose in three days Medications reviewed with the pateint and no untoward effects Individual and group therapy encouraged Coping skills for depression, substance abuse, and anxiety 3.  Treat health problems as indicated -discussed labs and effects of drinking on liver health 4.  Develop treatment plan to decrease risk of relapse upon discharge and the need for readmission 5.  Psych-social education regarding relapse prevention and self care. 6.  Health care follow up as needed for medical problems 7.  Continue home medications where appropriate 8.  Disposition in progress   Treatment Plan Summary: Daily contact with patient to assess and evaluate symptoms and progress in treatment Medication management    Current Medications:  Current Facility-Administered Medications  Medication Dose Route Frequency Provider Last Rate Last Dose  . acetaminophen (TYLENOL) tablet 650 mg  650 mg Oral Q6H PRN Nanci Pina, FNP      . alum & mag hydroxide-simeth  (MAALOX/MYLANTA) 200-200-20 MG/5ML suspension 30 mL  30 mL Oral Q4H PRN Nanci Pina, FNP      . hydrOXYzine (ATARAX/VISTARIL) tablet 25 mg  25 mg Oral Q6H  PRN Nanci Pina, FNP   25 mg at 06/22/14 2129  . loperamide (IMODIUM) capsule 2-4 mg  2-4 mg Oral PRN Nanci Pina, FNP      . LORazepam (ATIVAN) tablet 1 mg  1 mg Oral Q6H PRN Nanci Pina, FNP      . LORazepam (ATIVAN) tablet 1 mg  1 mg Oral QID Nanci Pina, FNP   1 mg at 06/23/14 9798   Followed by  . [START ON 06/24/2014] LORazepam (ATIVAN) tablet 1 mg  1 mg Oral TID Nanci Pina, FNP       Followed by  . [START ON 06/25/2014] LORazepam (ATIVAN) tablet 1 mg  1 mg Oral BID Nanci Pina, FNP       Followed by  . [START ON 06/26/2014] LORazepam (ATIVAN) tablet 1 mg  1 mg Oral Daily Nanci Pina, FNP      . magnesium hydroxide (MILK OF MAGNESIA) suspension 30 mL  30 mL Oral Daily PRN Nanci Pina, FNP      . multivitamin with minerals tablet 1 tablet  1 tablet Oral Daily Nanci Pina, FNP   1 tablet at 06/23/14 0816  . ondansetron (ZOFRAN-ODT) disintegrating tablet 4 mg  4 mg Oral Q6H PRN Nanci Pina, FNP   4 mg at 06/23/14 9211  . PARoxetine (PAXIL) tablet 20 mg  20 mg Oral Daily Nanci Pina, FNP   20 mg at 06/23/14 0816  . thiamine (VITAMIN B-1) tablet 100 mg  100 mg Oral Daily Nanci Pina, FNP   100 mg at 06/23/14 0815    Observation Level/Precautions:  15 minute checks  Laboratory:  Reviewed   Psychotherapy:  Individual and group  Medications:  See [facility]  Consultations:  As needed  Discharge Concerns:  mantanence  Estimated LOS: 5-7 days  Other:     I certify that inpatient services furnished can reasonably be expected to improve the patient's condition.   [facility], Ama 10/25/20159:55 AM  I have examined the patient and agreed with the findings of H&P and treatment plan. I have also done suicide assessment on this patient.

## 2014-06-23 NOTE — Progress Notes (Signed)
D: Patient is alert and oriented. Patient's mood is anxious and sad in the morning but improved throughout the afternoon. Pt's affect is flat and sullen. Pt denies SI/HI and AVH at this time. Pt states his goal today is to work on "go[ing] home" by "continu[ing] [his] detox." Pt reports depression 3/10, hopelessness 2/10, and anxiety 8/10. Pt observed tearful this morning and expressed concerns of having a tough day. Pt states his current stressors are his mother notifying his father that he is in the hospital. Pt expressed relief after speaking with his father. A: Medications administered per providers orders (See MAR). PRN medications administered per providers orders for anxiety (See MAR). Encouragement, reassurance, and support provided to pt. 15 minute checks completed per protocol for patient safety. R: Pt cooperative and receptive to nursing interventions.

## 2014-06-23 NOTE — BHH Group Notes (Signed)
[place] Group Notes: (Clinical Social Work)   06/23/2014      Type of Therapy:  Group Therapy   Participation Level:  Did Not Attend - did arrive and was attentive for the last few minutes of group   Selmer Dominion, LCSW 06/23/2014, 12:14 PM

## 2014-06-23 NOTE — Plan of Care (Signed)
Problem: Ineffective individual coping Goal: STG: Patient will participate in after care plan Outcome: Progressing Pt states today his father is becoming aware of his treatment and treatment plans.

## 2014-06-23 NOTE — Progress Notes (Signed)
Patient ID: Chad Mclaughlin, male   DOB: 1980/09/04, 33 y.o.   MRN: [id number] D)  Has been out on the unit this evening, has begun to integrate into the milieu.  Has been pleasant, cooperative, somewhat guarded and anxious.  Interacting appropriately with staff and peers.  Came to the med window this evening to ask for more ativan d/t anxiety, tremulous.  Attended the AA meeting this evening. A)  Will continue to monitor for safety, continue POC R)  Safety maintained.

## 2014-06-24 DIAGNOSIS — F411 Generalized anxiety disorder: Secondary | ICD-10-CM | POA: Diagnosis present

## 2014-06-24 NOTE — Progress Notes (Signed)
Patient ID: Chad Mclaughlin, male   DOB: 1981-04-26, 33 y.o.   MRN: [id number] D: Patient is pleasant on approach.  He states his depressive symptoms have decreased with his withdrawal symptoms are minimal.  He is sleeping better and eating well.  He rates his depression as a 3; hopelessness as a 0 and anxiety as an 8.  Patient is looking forward to being discharged home tomorrow.  He wrote on his self inventory that "I think I can detox at home.  I have enough family support."  Patient's thought processes is organized and relevant.  He denies any SI/HI/AVH.  He is attending groups and participating in his treatment. A: Continue to monitor medication management and MD orders.  Safety checks completed every 15 minutes per protocol.  Encourage and support patient as needed. R: Patient is receptive to staff; his behavior is appropriate.

## 2014-06-24 NOTE — Progress Notes (Signed)
Patient ID: Chad Mclaughlin, male   DOB: [birth year], 33 y.o.   MRN: [id number] D)  Has been brighter this evening, pleasant, conversational, states is feeling better every day, and is glad he has come for help.  States his family is supportive, has had stress over job, lay offs, bill, Social research officer, government.  States usually drinks when he gets together with friends,  Trying to focus on getting through detox today.  Attended AA group, came to med window afterward, feeling more anxious and bp elevated, was given hs ativan.   A)  Was given support, encouragement, continue to monitor for safety, continue POC R)  Receptive, appreciative, safety maintained.

## 2014-06-24 NOTE — Clinical Social Work Note (Signed)
CSW attempted to contact Patient's wife Ronnell Guadalajara [phone], left voicemail. Awaiting return call.  Tilden Fossa, MSW, [place] Worker [facility] [phone]

## 2014-06-24 NOTE — Progress Notes (Signed)
Adult Psychoeducational Group Note  Date:  06/24/2014 Time:  10:00am Group Topic/Focus:  Wellness Toolbox:   The focus of this group is to discuss various aspects of wellness, balancing those aspects and exploring ways to increase the ability to experience wellness.  Patients will create a wellness toolbox for use upon discharge.  Participation Level:  Active  Participation Quality:  Appropriate and Attentive  Affect:  Appropriate  Cognitive:  Alert and Appropriate  Insight: Appropriate  Engagement in Group:  Engaged  Modes of Intervention:  Discussion and Education  Additional Comments:  Pt attended and participated in group. Question was asked What is you good and bad quality? Pt stated his good quality is opened minded and his bad quality is he hold grudges.  Marlowe Shores D 06/24/2014, 10:35 AM

## 2014-06-24 NOTE — BHH Group Notes (Signed)
   [facility] LCSW Aftercare Discharge Planning Group Note  06/24/2014  8:45 AM   Participation Quality: Alert, Appropriate and Oriented  Mood/Affect: Anxious  Depression Rating: 5  Anxiety Rating: 5  Thoughts of Suicide: Pt denies SI/HI  Will you contract for safety? Yes  Current AVH: Pt denies  Plan for Discharge/Comments: Pt attended discharge planning group and actively participated in group. CSW provided pt with today's workbook. Patient reports feeling "pretty good, ready to discharge" today. He is agreeable to returning home to follow up with [facility] for outpatient services.  Transportation Means: Pt reports access to transportation  Supports: No supports mentioned at this time  Tilden Fossa, MSW, [place] Social Worker [employer] [phone]

## 2014-06-24 NOTE — BHH Group Notes (Signed)
[place] LCSW Group Therapy 06/24/2014  1:15 pm  Type of Therapy: Group Therapy Participation Level: Active  Participation Quality: Attentive, Sharing and Supportive  Affect: Depressed and Flat  Cognitive: Alert and Oriented  Insight: Developing/Improving and Engaged  Engagement in Therapy: Developing/Improving and Engaged  Modes of Intervention: Clarification, Confrontation, Discussion, Education, Exploration,  Limit-setting, Orientation, Problem-solving, Rapport Building, Art therapist, Socialization and Support  Summary of Progress/Problems: Pt identified obstacles faced currently and processed barriers involved in overcoming these obstacles. Pt identified steps necessary for overcoming these obstacles and explored motivation (internal and external) for facing these difficulties head on. Pt further identified one area of concern in their lives and chose a goal to focus on for today. Patient reports his primary obstacle as "not drinking". Patient was able to identify the need to change his environment to sustain his recovery. Patient reports that he has strong family support and is open to attending [place] meetings. CSW's provided emotional support and encouragement.  Tilden Fossa, MSW, [place] Worker [facility] [phone]

## 2014-06-24 NOTE — BHH Suicide Risk Assessment (Signed)
[place] INPATIENT:  Family/Significant Other Suicide Prevention Education  Suicide Prevention Education:  Education Completed; Wife Ronnell Guadalajara [phone],  (name of family member/significant other) has been identified by the patient as the family member/significant other with whom the patient will be residing, and identified as the person(s) who will aid the patient in the event of a mental health crisis (suicidal ideations/suicide attempt).  With written consent from the patient, the family member/significant other has been provided the following suicide prevention education, prior to the and/or following the discharge of the patient.  The suicide prevention education provided includes the following:  Suicide risk factors  Suicide prevention and interventions  National Suicide Hotline telephone number  [facility] assessment telephone number  [facility] Emergency Assistance Learned and/or Residential Mobile Crisis Unit telephone number  Request made of family/significant other to:  Remove weapons (e.g., guns, rifles, knives), all items previously/currently identified as safety concern.    Remove drugs/medications (over-the-counter, prescriptions, illicit drugs), all items previously/currently identified as a safety concern.  The family member/significant other verbalizes understanding of the suicide prevention education information provided.  The family member/significant other agrees to remove the items of safety concern listed above.  Chery Giusto, Casimiro Needle 06/24/2014, 11:41 AM

## 2014-06-24 NOTE — Clinical Social Work Note (Signed)
CSW provided patient with listing of local [place] meetings.  Tilden Fossa, MSW, [place] Worker [facility] [phone]

## 2014-06-24 NOTE — Progress Notes (Signed)
[facility] MD Progress Note  06/24/2014 2:07 PM Chad Mclaughlin  MRN:  [id number] Subjective:  Chad Mclaughlin endorses that he was detox a month or so ago but that he relapsed shortly afterwards. States his wife drinks "not as him." He is employed, likes his job. States that he is now aware of how his liver is being affected and states he is committed to make it work this time around. He states he suffers from anxiety. He uses Paxil what is beneficial but he still experiences bouts of it. He was started on Lamictal as adjunct to the Paxil.  Diagnosis:   DSM5: Substance/Addictive Disorders:  Alcohol Related Disorder - Severe (303.90) Depressive Disorders:  Major Depressive Disorder - Mild (296.21) Total Time spent with patient: 30 minutes  Axis I: Generalized Anxiety Disorder  ADL's:  Intact  Sleep: Fair  Appetite:  Fair  Psychiatric Specialty Exam: Physical Exam  Review of Systems  Constitutional: Negative.   HENT: Negative.   Eyes: Negative.   Respiratory: Negative.   Cardiovascular: Negative.   Gastrointestinal: Negative.   Genitourinary: Negative.   Musculoskeletal: Negative.   Skin: Negative.   Neurological: Negative.   Endo/Heme/Allergies: Negative.   Psychiatric/Behavioral: Positive for substance abuse. The patient is nervous/anxious and has insomnia.     Blood pressure 133/103, pulse 108, temperature 98.3 F (36.8 C), temperature source Oral, resp. rate 20, height 5\' 5"  (1.651 m), weight 70.308 kg (155 lb), SpO2 99.00%.Body mass index is 25.79 kg/(m^2).  General Appearance: Fairly Groomed  Engineer, water::  Fair  Speech:  Clear and Coherent  Volume:  Normal  Mood:  Anxious and worried  Affect:  anxious, worried  Thought Process:  Coherent and Goal Directed  Orientation:  Full (Time, Place, and Person)  Thought Content:  symptoms worries concerns  Suicidal Thoughts:  No  Homicidal Thoughts:  No  Memory:  Immediate;   Fair Recent;   Fair Remote;   Fair  Judgement:  Fair   Insight:  Present  Psychomotor Activity:  Restlessness  Concentration:  Fair  Recall:  [employer] [place]: Fair  Akathisia:  No  Handed:    AIMS (if indicated):     Assets:  Desire for Improvement Housing Social Support Vocational/Educational  Sleep:  Number of Hours: 4.75   Musculoskeletal: Strength & Muscle Tone: within normal limits Gait & Station: normal Patient leans: [name]  Current Medications: Current Facility-Administered Medications  Medication Dose Route Frequency Provider Last Rate Last Dose  . acetaminophen (TYLENOL) tablet 650 mg  650 mg Oral Q6H PRN Nanci Pina, FNP      . alum & mag hydroxide-simeth (MAALOX/MYLANTA) 200-200-20 MG/5ML suspension 30 mL  30 mL Oral Q4H PRN Nanci Pina, FNP      . hydrOXYzine (ATARAX/VISTARIL) tablet 25 mg  25 mg Oral Q6H PRN Nanci Pina, FNP   25 mg at 06/22/14 2129  . lamoTRIgine (LAMICTAL) tablet 25 mg  25 mg Oral Daily Knox Royalty, NP   25 mg at 06/24/14 0816  . loperamide (IMODIUM) capsule 2-4 mg  2-4 mg Oral PRN Nanci Pina, FNP      . LORazepam (ATIVAN) tablet 1 mg  1 mg Oral Q6H PRN Nanci Pina, FNP   1 mg at 06/23/14 1030  . LORazepam (ATIVAN) tablet 1 mg  1 mg Oral TID Nanci Pina, FNP   1 mg at 06/24/14 1141   Followed by  . [START ON 06/25/2014] LORazepam (ATIVAN) tablet 1 mg  1 mg Oral BID Nanci Pina, FNP       Followed by  . [START ON 06/26/2014] LORazepam (ATIVAN) tablet 1 mg  1 mg Oral Daily Nanci Pina, FNP      . magnesium hydroxide (MILK OF MAGNESIA) suspension 30 mL  30 mL Oral Daily PRN Nanci Pina, FNP      . multivitamin with minerals tablet 1 tablet  1 tablet Oral Daily Nanci Pina, FNP   1 tablet at 06/24/14 0816  . ondansetron (ZOFRAN-ODT) disintegrating tablet 4 mg  4 mg Oral Q6H PRN Nanci Pina, FNP   4 mg at 06/23/14 3419  . PARoxetine (PAXIL) tablet 20 mg  20 mg Oral Daily Knox Royalty, NP   20 mg at 06/24/14 0816  . thiamine (VITAMIN  B-1) tablet 100 mg  100 mg Oral Daily Nanci Pina, FNP   100 mg at 06/24/14 3790    Lab Results: No results found for this or any previous visit (from the past 48 hour(s)).  Physical Findings: AIMS:  , ,  ,  ,    CIWA:  CIWA-Ar Total: 6 COWS:     Treatment Plan Summary: Daily contact with patient to assess and evaluate symptoms and progress in treatment Medication management  Plan: Supportive approach/coping skills/relapse prevention           Continue detox           Reassess and address the co morbidities/optimize response to psychotropics           CBT;mindfulness  Medical Decision Making Problem Points:  Review of psycho-social stressors (1) Data Points:  Review of medication regiment & side effects (2)  I certify that inpatient services furnished can reasonably be expected to improve the patient's condition.   [place] A 06/24/2014, 2:07 PM

## 2014-06-25 DIAGNOSIS — F10230 Alcohol dependence with withdrawal, uncomplicated: Secondary | ICD-10-CM

## 2014-06-25 MED ORDER — PAROXETINE HCL 20 MG PO TABS: 20.0000 mg | ORAL_TABLET | Freq: Every day | ORAL | Status: DC

## 2014-06-25 MED ORDER — LORAZEPAM 1 MG PO TABS: ORAL_TABLET | ORAL | Status: DC

## 2014-06-25 MED ORDER — ADULT MULTIVITAMIN W/MINERALS CH: 1.0000 | ORAL_TABLET | Freq: Every day | ORAL | Status: DC

## 2014-06-25 MED ORDER — LAMOTRIGINE 25 MG PO TABS: 25.0000 mg | ORAL_TABLET | Freq: Every day | ORAL | Status: DC

## 2014-06-25 NOTE — Progress Notes (Signed)
Patient ID: Chad Mclaughlin, male   DOB: 19-Dec-1980, 33 y.o.   MRN: [id number] He  Has been discharged home and was picked up by his wife. He voiced understanding of discharge teaching about medications and follow up care. He denies thoughts of SI and all his belongings were taken home with him.

## 2014-06-25 NOTE — Progress Notes (Signed)
[facility] Adult Case Management Discharge Plan :  Will you be returning to the same living situation after discharge: Yes,  patient will return home  At discharge, do you have transportation home?:Yes,  patient reports transportation home Do you have the ability to pay for your medications:Yes,  patient will be provided with prescriptions at discharge and verbalizes his ability to obtain medications.  Release of information consent forms completed and in the chart;  Patient's signature needed at discharge.  Patient to Follow up at: Follow-up Information   Follow up with [facility] On 06/27/2014. (Please present to appointment with Mr. Ringer on Thursday Oct. 29th at 4:45 pm for assessment for therapy and medication management services. Please call office if you need to reschedule. )    Contact information:   [address], [place], [place] [address] [phone]      Patient denies SI/HI:   Yes,  denies    Safety Planning and Suicide Prevention discussed:  Yes,  with patient and wife   Starsha Morning, Casimiro Needle 06/25/2014, 10:36 AM

## 2014-06-25 NOTE — [facility] Suicide Risk Assessment (Signed)
Suicide Risk Assessment  Discharge Assessment     Demographic Factors:  Male and Caucasian  Total Time spent with patient: 30 minutes  Psychiatric Specialty Exam:     Blood pressure 134/91, pulse 111, temperature 98.1 F (36.7 C), temperature source Oral, resp. rate 18, height 5\' 5"  (1.651 m), weight 70.308 kg (155 lb), SpO2 99.00%.Body mass index is 25.79 kg/(m^2).  General Appearance: Fairly Groomed  Engineer, water::  Fair  Speech:  Clear and Coherent  Volume:  Normal  Mood:  Euthymic  Affect:  Appropriate  Thought Process:  Coherent and Goal Directed  Orientation:  Full (Time, Place, and Person)  Thought Content:  plans as he moves on, relapse prevention plan  Suicidal Thoughts:  No  Homicidal Thoughts:  No  Memory:  Immediate;   Fair Recent;   Fair Remote;   Fair  Judgement:  Fair  Insight:  Present  Psychomotor Activity:  Normal  Concentration:  Fair  Recall:  [employer] of Knowledge:NA  Language: Fair  Akathisia:  No  Handed:    AIMS (if indicated):     Assets:  Desire for Improvement Housing Social Support Transportation Vocational/Educational  Sleep:  Number of Hours: 6    Musculoskeletal: Strength & Muscle Tone: within normal limits Gait & Station: normal Patient leans: [name]   Mental Status Per Nursing Assessment::   On Admission:  NA  Current Mental Status by Physician: In full contact with reality. He denies SI plans or intent. His VS are more stable. He will complete Ativan taper at home. States he is committed to abstinence. He will get a PCP to follow up his liver enzymes and possible underlying high blood pressure   Loss Factors: NA  Historical Factors: NA  Risk Reduction Factors:   Sense of responsibility to family, Employed, Living with another person, especially a relative and Positive social support  Continued Clinical Symptoms:  Alcohol/Substance Abuse/Dependencies  Cognitive Features That Contribute To Risk: None  identified   Suicide Risk:  Minimal: No identifiable suicidal ideation.  Patients presenting with no risk factors but with morbid ruminations; may be classified as minimal risk based on the severity of the depressive symptoms  Discharge Diagnoses:   AXIS I:  Alcohol Dependence, Alcohol Withdrawal, GAD AXIS II:  No diagnosis AXIS III:   Past Medical History  Diagnosis Date  . Anxiety   . Bipolar affective disorder   . Alcohol abuse    AXIS IV:  other psychosocial or environmental problems AXIS V:  61-70 mild symptoms  Plan Of Care/Follow-up recommendations:  Activity:  as tolerated Diet:  regular Follow up outpatient basis  Is patient on multiple antipsychotic therapies at discharge:  No   Has Patient had three or more failed trials of antipsychotic monotherapy by history:  No  Recommended Plan for Multiple Antipsychotic Therapies: NA    Betheny Suchecki A 06/25/2014, 12:18 PM

## 2014-06-25 NOTE — Discharge Summary (Signed)
Physician Discharge Summary Note  Patient:  Chad Mclaughlin is an 33 y.o., male MRN:  [id number] DOB:  May 12, 1981 Patient phone:  [phone] (home)  Patient address:   [address] [place] [address],  Total Time spent with patient: 30 minutes  Date of Admission:  06/22/2014 Date of Discharge: 06/25/14  Reason for Admission:  Anxiety, Alcohol abuse   Discharge Diagnoses: Active Problems:   Alcohol dependence   GAD (generalized anxiety disorder)  Psychiatric Specialty Exam: Physical Exam  Psychiatric: He has a normal mood and affect. His speech is normal and behavior is normal. Judgment and thought content normal. Cognition and memory are normal.    Review of Systems  Constitutional: Negative.   HENT: Negative.   Eyes: Negative.   Respiratory: Negative.   Cardiovascular: Negative.   Gastrointestinal: Negative.   Genitourinary: Negative.   Musculoskeletal: Negative.   Skin: Negative.   Neurological: Negative.   Endo/Heme/Allergies: Negative.   Psychiatric/Behavioral: Positive for substance abuse (Stable with treatment ). The patient is nervous/anxious (Stable with treatment ).     Blood pressure 134/91, pulse 111, temperature 98.1 F (36.7 C), temperature source Oral, resp. rate 18, height 5\' 5"  (1.651 m), weight 70.308 kg (155 lb), SpO2 99.00%.Body mass index is 25.79 kg/(m^2).  See Physician SRA                                                  Past Psychiatric History: See H&P Diagnosis:  Hospitalizations:  Outpatient Care:  Substance Abuse Care:  Self-Mutilation:  Suicidal Attempts:  Violent Behaviors:   Musculoskeletal: Strength & Muscle Tone: within normal limits Gait & Station: normal Patient leans: [name]  DSM5:  AXIS I: Alcohol Dependence, Alcohol Withdrawal, GAD  AXIS II: No diagnosis  AXIS III:  Past Medical History   Diagnosis  Date   .  Anxiety    .  Bipolar affective disorder    .  Alcohol abuse     AXIS IV:  other psychosocial or environmental problems  AXIS V: 61-70 mild symptoms  Level of Care:  OP  Hospital Course:  Chad Mclaughlin is an 33 y.o. male who works at [employer] who came to [facility] seeking detox/ treatment for alcohol abuse. He reports his wife and step-father "Made him come". Pt is currently drinking 2 beers plus about 10 shots of vodka/day. Pt denies hx of seizures, but c/o sweats, shakiness and weakness currently. Pt said that one month ago, he drank heavily over the course of a weekend and did not eat all day on Sunday, and then came to the hospital Monday morning vomiting every few minutes and was medically admitted to the hospital. He reports staying in the ED for a few days.  He says he is now having to drink in the am to treat his symptoms of withdrawals.  Pt says he sees a psychiatrist (Dr Ulyses Jarred) for anxiety and takes Paxil and Ativan PRN (about 3 x/week).  His stressors include paying off some bills and stress at work where people have been laid off. He describes his parents and his wife as supportive.          Chad Mclaughlin was admitted to the adult unit. He was evaluated and his symptoms were identified. Medication management was discussed and initiated. Patient was placed on the Ativan taper to safely  detox him from alcohol. His Paxil 20 mg daily was continued to treat his anxiety/depression. He was started on Lamictal 25 mg daily to improve his mood stability with no adverse reaction such as a rash noted. He was oriented to the unit and encouraged to participate in unit programming. Medical problems were identified and treated appropriately. Home medication was restarted as needed.        The patient was evaluated each day by a clinical provider to ascertain the patient's response to treatment.  Improvement was noted by the patient's report of decreasing symptoms, improved sleep and appetite, affect, medication tolerance, behavior, and participation in unit programming.  He  was asked each day to complete a self inventory noting mood, mental status, pain, new symptoms, anxiety and concerns.         He responded well to medication and being in a therapeutic and supportive environment. Positive and appropriate behavior was noted and the patient was motivated for recovery.  The patient worked closely with the treatment team and case manager to develop a discharge plan with appropriate goals. He reported being committed to his sobriety this time around. Patient felt more committed after learning of tha this liver was being negatively affected. Coping skills, problem solving as well as relaxation therapies were also part of the unit programming.         By the day of discharge he was in much improved condition than upon admission.  Symptoms were reported as significantly decreased or resolved completely. His blood pressure and pulse were noted to still be mildly elevated secondary to alcohol withdrawal. Due to this his ativan taper was continued after his discharge to include two 1 mg doses on 06/26/14 then one dose on 06/27/14.  The patient denied SI/HI and voiced no AVH. He was motivated to continue taking medication with a goal of continued improvement in mental health.  Chad Mclaughlin was discharged home with a plan to follow up as noted below. Patient left [place] in stable condition with all belongings returned to him.   Consults:  None  Significant Diagnostic Studies:  Chemistry panel, UA, UDS negative,   Discharge Vitals:   Blood pressure 134/91, pulse 111, temperature 98.1 F (36.7 C), temperature source Oral, resp. rate 18, height 5\' 5"  (1.651 m), weight 70.308 kg (155 lb), SpO2 99.00%. Body mass index is 25.79 kg/(m^2). Lab Results:   Results for orders placed during the hospital encounter of 06/21/14 (from the past 72 hour(s))  ETHANOL     Status: None   Collection Time    06/22/14  1:15 PM      Result Value Ref Range   Alcohol, Ethyl (B) <11  0 - 11 mg/dL    Comment:            LOWEST DETECTABLE LIMIT FOR     SERUM ALCOHOL IS 11 mg/dL     FOR MEDICAL PURPOSES ONLY    Physical Findings: AIMS: Facial and Oral Movements Muscles of Facial Expression: None, normal Lips and Perioral Area: None, normal Jaw: None, normal Tongue: None, normal,Extremity Movements Upper (arms, wrists, hands, fingers): None, normal Lower (legs, knees, ankles, toes): None, normal, Trunk Movements Neck, shoulders, hips: None, normal, Overall Severity Severity of abnormal movements (highest score from questions above): None, normal Incapacitation due to abnormal movements: None, normal Patient's awareness of abnormal movements (rate only patient's report): No Awareness, Dental Status Current problems with teeth and/or dentures?: No Does patient usually wear dentures?: No  CIWA:  CIWA-Ar Total: 3 COWS:     Psychiatric Specialty Exam: See Psychiatric Specialty Exam and Suicide Risk Assessment completed by Attending Physician prior to discharge.  Discharge destination:  Home  Is patient on multiple antipsychotic therapies at discharge:  No   Has Patient had three or more failed trials of antipsychotic monotherapy by history:  No  Recommended Plan for Multiple Antipsychotic Therapies: NA     Medication List    STOP taking these medications       ALPRAZolam 1 MG tablet  Commonly known as:  XANAX      TAKE these medications     Indication   lamoTRIgine 25 MG tablet  Commonly known as:  LAMICTAL  Take 1 tablet (25 mg total) by mouth daily.   Indication:  Depression, Mood control     multivitamin with minerals Tabs tablet  Take 1 tablet by mouth daily. May purchase over the counter to improve general health.   Indication:  Vitamin Supplementation     PARoxetine 20 MG tablet  Commonly known as:  PAXIL  Take 1 tablet (20 mg total) by mouth daily.   Indication:  Generalized Anxiety Disorder           Follow-up Information   Follow up with [facility] On 06/27/2014. (Please present to appointment with Mr. Ringer on Thursday Oct. 29th at 4:45 pm for assessment for therapy and medication management services. Please call office if you need to reschedule. )    Contact information:   [address], [place], [place] [address] [phone]      Follow-up recommendations:   Activity: as tolerated  Diet: regular  Follow up outpatient basis   Comments:   Take all your medications as prescribed by your mental healthcare provider.  Report any adverse effects and or reactions from your medicines to your outpatient provider promptly.  Patient is instructed and cautioned to not engage in alcohol and or illegal drug use while on prescription medicines.  In the event of worsening symptoms, patient is instructed to call the crisis hotline, 911 and or go to the nearest ED for appropriate evaluation and treatment of symptoms.  Follow-up with your primary care provider for your other medical issues, concerns and or health care needs.   Total Discharge Time:  Greater than 30 minutes.  SignedElmarie Shiley NP-C 06/25/2014, 9:26 AM  I personally assessed the patient and formulated the plan Geralyn Flash A. Sabra Heck, M.D.

## 2014-06-25 NOTE — Tx Team (Signed)
Interdisciplinary Treatment Plan Update (Adult) Date: 06/25/2014   Time Reviewed: 9:30 AM  Progress in Treatment: Attending groups: Yes Participating in groups: Yes Taking medication as prescribed: Yes Tolerating medication: Yes Family/Significant other contact made: Yes, CSW has spoken with patient's wife Tracie Patient understands diagnosis: Yes Discussing patient identified problems/goals with staff: Yes Medical problems stabilized or resolved: Yes Denies suicidal/homicidal ideation: Yes Issues/concerns per patient self-inventory: Yes Other:  New problem(s) identified: [name]  Discharge Plan or Barriers: Patient plans to discharge home and follow up with [facility] for outpatient services. Discharge anticipated for today 10/27.  Reason for Continuation of Hospitalization:  Depression Anxiety Medication Stabilization   Comments: [name]  Estimated length of stay: Discharge anticipated for today 10/27  For review of initial/current patient goals, please see plan of care. Patient is a 33 YO employed married Caucasian male admitted with diagnosis of Alcohol Abuse. Patient would benefit from crisis stabilization, medication evaluation, therapy groups for processing thoughts/feelings/experiences, psycho ed groups for increasing coping skills, and aftercare planning.Patient is interested in entering an outpatient substance abuse program following detox.    Attendees: Patient:    Family:    Physician: Dr. Parke Poisson; Dr. Sabra Heck 06/25/2014 9:30 AM  Nursing: Janann August; Eduard Roux; Satira Sark , RN 06/25/2014 9:30 AM  Clinical Social Worker: Erasmo Downer Kelechi Astarita,  [place] 06/25/2014 9:30 AM  Other: Joette Catching, LCSW 06/25/2014 9:30 AM  Other: Lucinda Dell, [place] Sessions Liaison 06/25/2014 9:30 AM  Other: Lars Pinks, Case Manager 06/25/2014 9:30 AM  Other:  06/25/2014 9:30 AM  Other:    Other:    Other:    Other:    Other:        Scribe for Treatment Team:  Tilden Fossa,  MSW, [employer] [phone]

## 2014-06-25 NOTE — Progress Notes (Signed)
D  Pt is pleasant and cooperative   He attended AA and interacts well with his peers   He has some mild tremors and requested medications for sleep A   Verbal support and encouragement given   Medications administered and effectiveness monitored    Q 15 min checks R   Pt safe at present

## 2014-06-28 NOTE — Progress Notes (Signed)
Patient Discharge Instructions:  After Visit Summary (AVS):   Faxed to:  06/28/14 Discharge Summary Note:   Faxed to:  06/28/14 Psychiatric Admission Assessment Note:   Faxed to:  06/28/14 Suicide Risk Assessment - Discharge Assessment:   Faxed to:  06/28/14 Faxed/Sent to the Next [facility] Care provider:  06/28/14 Faxed to [facility], 06/28/2014, 3:42 PM

## 2014-07-04 ENCOUNTER — Emergency Department (HOSPITAL_COMMUNITY)
Admission: EM | Admit: 2014-07-04 | Discharge: 2014-07-04 | Disposition: A | Discharge status: Home / Self Care | Payer: Commercial Managed Care - PPO | Attending: Emergency Medicine | Admitting: Emergency Medicine

## 2014-07-04 ENCOUNTER — Encounter (HOSPITAL_COMMUNITY): Payer: Self-pay | Admitting: Emergency Medicine

## 2014-07-04 DIAGNOSIS — F319 Bipolar disorder, unspecified: Secondary | ICD-10-CM | POA: Diagnosis not present

## 2014-07-04 DIAGNOSIS — F10120 Alcohol abuse with intoxication, uncomplicated: Secondary | ICD-10-CM | POA: Insufficient documentation

## 2014-07-04 DIAGNOSIS — F10129 Alcohol abuse with intoxication, unspecified: Secondary | ICD-10-CM | POA: Diagnosis present

## 2014-07-04 DIAGNOSIS — Z79899 Other long term (current) drug therapy: Secondary | ICD-10-CM | POA: Diagnosis not present

## 2014-07-04 DIAGNOSIS — F419 Anxiety disorder, unspecified: Secondary | ICD-10-CM | POA: Diagnosis not present

## 2014-07-04 DIAGNOSIS — F10920 Alcohol use, unspecified with intoxication, uncomplicated: Secondary | ICD-10-CM

## 2014-07-04 LAB — COMPREHENSIVE METABOLIC PANEL
ALBUMIN: 3.9 g/dL (ref 3.5–5.2)
ALT: 43 U/L (ref 0–53)
AST: 56 U/L — AB (ref 0–37)
Alkaline Phosphatase: 57 U/L (ref 39–117)
Anion gap: 16 — ABNORMAL HIGH (ref 5–15)
BUN: 5 mg/dL — ABNORMAL LOW (ref 6–23)
CALCIUM: 8.9 mg/dL (ref 8.4–10.5)
CO2: 23 mEq/L (ref 19–32)
CREATININE: 0.87 mg/dL (ref 0.50–1.35)
Chloride: 104 mEq/L (ref 96–112)
GFR calc Af Amer: >90 mL/min (ref >90)
Glucose, Bld: 93 mg/dL (ref 70–99)
Potassium: 4.3 mEq/L (ref 3.7–5.3)
Sodium: 143 mEq/L (ref 137–147)
Total Bilirubin: 0.7 mg/dL (ref 0.3–1.2)
Total Protein: 7 g/dL (ref 6.0–8.3)

## 2014-07-04 LAB — CBC
HCT: 45.4 % (ref 39.0–52.0)
Hemoglobin: 16.1 g/dL (ref 13.0–17.0)
MCH: 33.3 pg (ref 26.0–34.0)
MCHC: 35.5 g/dL (ref 30.0–36.0)
MCV: 94 fL (ref 78.0–100.0)
PLATELETS: 352 10*3/uL (ref 150–400)
RBC: 4.83 MIL/uL (ref 4.22–5.81)
RDW: 13 % (ref 11.5–15.5)
WBC: 2.7 10*3/uL — ABNORMAL LOW (ref 4.0–10.5)

## 2014-07-04 LAB — ETHANOL: ALCOHOL ETHYL (B): 256 mg/dL — AB (ref 0–11)

## 2014-07-04 LAB — RAPID URINE DRUG SCREEN, HOSP PERFORMED
Amphetamines: NOT DETECTED
BARBITURATES: NOT DETECTED
Benzodiazepines: NOT DETECTED
COCAINE: NOT DETECTED
OPIATES: NOT DETECTED
Tetrahydrocannabinol: NOT DETECTED

## 2014-07-04 LAB — SALICYLATE LEVEL: Salicylate Lvl: <2 mg/dL — ABNORMAL LOW (ref 2.8–20.0)

## 2014-07-04 LAB — ACETAMINOPHEN LEVEL: Acetaminophen (Tylenol), Serum: <15 ug/mL (ref 10–30)

## 2014-07-04 MED ORDER — LORAZEPAM 1 MG PO TABS
0.0000 mg | ORAL_TABLET | Freq: Four times a day (QID) | ORAL | Status: DC
  Administered 2014-07-04: 1 mg via ORAL
  Filled 2014-07-04: qty 1

## 2014-07-04 MED ORDER — LORAZEPAM 1 MG PO TABS: 0.0000 mg | ORAL_TABLET | Freq: Two times a day (BID) | ORAL | Status: DC

## 2014-07-04 MED ORDER — ONDANSETRON 4 MG PO TBDP
4.0000 mg | ORAL_TABLET | Freq: Once | ORAL | Status: AC
  Administered 2014-07-04: 4 mg via ORAL
  Filled 2014-07-04: qty 1

## 2014-07-04 NOTE — BH Assessment (Signed)
[facility] Assessment Progress Note  Spoke with [place], [place] and took history of pt, spoke with the nurse to set up tele assessment.

## 2014-07-04 NOTE — ED Provider Notes (Signed)
CSN: [id number]     Arrival date & time 07/04/14  1124 History   None    Chief Complaint  Patient presents with  . Alcohol Intoxication     (Consider location/radiation/quality/duration/timing/severity/associated sxs/prior Treatment) HPI Chad Mclaughlin is a 33 y.o. male with a history of depression and reported bipolar comes in for evaluation for alcohol detox. Patient was recently discharged from a detox treatment facility and has since relapsed drinking 8-12 beers daily since Saturday. Last alcohol consumption was this morning at 3 AM. Patient denies any auditory or visual hallucinations. No suicidal or homicidal ideations. Says he relapsed because he is concerned his wife will leave him over his struggles with alcohol and alcohol detox. He is also concerned about losing his job and has been for the past month. Patient also reports he is not compliant with his medications for depression or bipolar. He is accompanied by his mother and both agree they are willing to enroll in an outpatient treatment program as soon as possible. Patient is tearful as he gives his account. Explains he thought he could drink alcohol in moderation after he went through detox, but now acknowledges that he must abstain completely from alcohol. Reports only nausea at this time. No fevers, tremors, chest pain or shortness of breath. Past Medical History  Diagnosis Date  . Anxiety   . Bipolar affective disorder   . Alcohol abuse    History reviewed. No pertinent past surgical history. No family history on file. History  Substance Use Topics  . Smoking status: Never Smoker   . Smokeless tobacco: Not on file  . Alcohol Use: Yes    Review of Systems  Constitutional: Negative for fever.  HENT: Negative for sore throat.   Eyes: Negative for visual disturbance.  Respiratory: Negative for shortness of breath.   Cardiovascular: Negative for chest pain.  Gastrointestinal: Positive for nausea. Negative for abdominal  pain.  Endocrine: Negative for polyuria.  Genitourinary: Negative for dysuria.  Skin: Negative for rash.  Neurological: Negative for headaches.  Psychiatric/Behavioral: Negative for suicidal ideas, hallucinations, confusion, self-injury and agitation. The patient is not nervous/anxious.       Allergies  Shellfish allergy  Home Medications   Prior to Admission medications   Medication Sig Start Date End Date Taking? Authorizing Provider  lamoTRIgine (LAMICTAL) 25 MG tablet Take 1 tablet (25 mg total) by mouth daily. 06/25/14   Elmarie Shiley, NP  LORazepam (ATIVAN) 1 MG tablet Take one 1 mg tablet at 5 pm and bedtime on 06/25/14. Take one tablet in am and at bedtime on 06/26/14. Then one tablet in the morning on 06/27/14 then stop. 06/25/14   Elmarie Shiley, NP  Multiple Vitamin (MULTIVITAMIN WITH MINERALS) TABS tablet Take 1 tablet by mouth daily. May purchase over the counter to improve general health. 06/25/14   Elmarie Shiley, NP  PARoxetine (PAXIL) 20 MG tablet Take 1 tablet (20 mg total) by mouth daily. 06/25/14   Elmarie Shiley, NP   BP 116/89 mmHg  Pulse 79  Temp(Src) 98.1 F (36.7 C) (Oral)  Resp 12  SpO2 100% Physical Exam  Constitutional: He is oriented to person, place, and time. He appears well-developed and well-nourished. No distress.  HENT:  Head: Normocephalic and atraumatic.  Mouth/Throat: Oropharynx is clear and moist.  Eyes: Conjunctivae are normal. Pupils are equal, round, and reactive to light. Right eye exhibits no discharge. Left eye exhibits no discharge. No scleral icterus.  Neck: Neck supple.  Cardiovascular: Normal rate, regular rhythm and  normal heart sounds.   Pulmonary/Chest: Effort normal and breath sounds normal. No respiratory distress. He has no wheezes. He has no rales.  Abdominal: Soft. There is no tenderness.  Musculoskeletal: He exhibits no tenderness.  Neurological: He is alert and oriented to person, place, and time.  Cranial Nerves II-XII grossly  intact. No tremor activity  Skin: Skin is warm and dry. No rash noted. He is not diaphoretic.  Psychiatric: He has a normal mood and affect.  Nursing note and vitals reviewed.   ED Course  Procedures (including critical care time) Labs Review Labs Reviewed  CBC - Abnormal; Notable for the following:    WBC 2.7 (*)    All other components within normal limits  COMPREHENSIVE METABOLIC PANEL  ETHANOL  ACETAMINOPHEN LEVEL  SALICYLATE LEVEL  URINE RAPID DRUG SCREEN (HOSP PERFORMED)    Imaging Review No results found.   EKG Interpretation None      MDM  Vitals stable - WNL -afebrile Pt resting comfortably in ED. Nausea resolved in ED. CIWA protocol in place PE not concerning for acute or emergent pathology. No evidence of DTs at this time Labwork noncontributory  Patient is stable at this time, medically cleared. TTS will see in ED. Pt spoke with TTS, plan is for pt to contact IOP to set up enrollment in program. Advised against immediate cessation of alcohol consumption at this point.  Discussed f/u with PCP and return precautions, pt very amenable to plan. Prior to patient discharge, I discussed and reviewed this case with Dr.Docherty  Final diagnoses:  Alcohol intoxication, uncomplicated        Viona Gilmore McKinley, PA-C 07/05/14 9833  Ernestina Patches, MD 07/05/14 1450

## 2014-07-04 NOTE — ED Notes (Signed)
Pt states has been drinking a lot is very depressed last etohb at 3 am 10 beers denies HI but is afraid he might die pt tearful  His mom is w/him

## 2014-07-04 NOTE — BH Assessment (Addendum)
Tele Assessment Note   Chad Mclaughlin is an 33 y.o. male who came to [facility] requesting help with alcohol detox and treatment.  He works at [employer] who came to [employer] seeking detox/ treatment for alcohol abuse. Pt was drinking 2 beers plus about 10 shots of vodka/day until he came in for detox last week.  He was sober about 4 days and then started drinking again and has been drinking since Saturday.  He states he drank 12 beers within the 12 hours before 3 am this morning. Pt denies hx of seizures, but c/o anxiety, shakiness and weakness currently.   Pt said that one month ago, he drank heavily over the course of a weekend and did not eat all day on Sunday, and then came to the hospital Monday morning vomiting every few minutes and was medically admitted to the hospital. Before detox last week, pt was having to drink in the am to treat his symptoms of withdrawals.  Pt says he sees a psychiatrist for anxiety and takes Paxil and Ativan PRN (about 3 x/week). His stressors include paying off some bills and stress at work where people have been laid off. He describes his parents and his wife as supportive.  Pt was cooperative and pleasant during interview. He had good eye contact and casual appearance. Pt admits to depression, but denies current SI, HI, A/V hallucinations and does not appear to be responding to internal stimuli. Pt has normal movement.  Pt has a gun in the home, and writer spoke with pt and his mom about getting guns out of the home since pt admits to having has SI once or twice while intoxicated.  He say he knows he would never do anything because of his dogs and his wife.  Pt is concerned that his wife is upset with him due to his drinking and will "not want to bother with me anymore".  Discussed options with pt including IP and Obs, and pt wants to be d/c and go to IOP with an Ativan RX.  Pt says his mom is there, his dad is on the way, and his uncle, who has gone through  treatment before is also on his way, and they plan to be with him for the next several days.  Spoke with [place], [place] re: options, and he will discuss with pt., and ED will contact TTS if further assistance is needed.  If pt is discharged to IOP, he would need to call Brandon Melnick to set up an intake appt at [phone].  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 Anxiety   . Bipolar affective disorder   . Alcohol abuse    Axis IV: housing problems, occupational problems and problems with primary support group Axis V: 41-50 serious symptoms  Past Medical History:  Past Medical History  Diagnosis Date  . Anxiety   . Bipolar affective disorder   . Alcohol abuse     History reviewed. No pertinent past surgical history.  Family History: No family history on file.  Social History:  reports that he has never smoked. He does not have any smokeless tobacco history on file. He reports that he drinks alcohol. He reports that he does not use illicit drugs.  Additional Social History:  Alcohol / Drug Use Pain Medications: none noted Prescriptions: none noted Over the Counter: none noted History of alcohol / drug use?: Yes Longest period  of sobriety (when/how long): 1 week Negative Consequences of Use: Financial, Personal relationships Withdrawal Symptoms: Tremors, Nausea / Vomiting (anxiety) Substance #1 Name of Substance 1: alcohol 1 - Age of First Use: 22 1 - Amount (size/oz): 2 beers, 10 shots/day 1 - Frequency: daily 1 - Duration: years 1 - Last Use / Amount: 3 am --drank 10-12 beers over the last 12-18 hours  CIWA: CIWA-Ar BP: 119/81 mmHg Pulse Rate: 76 Nausea and Vomiting: mild nausea with no vomiting Tactile Disturbances: none Tremor: moderate, with patient's arms extended Auditory Disturbances: not present Paroxysmal Sweats: no sweat visible Visual Disturbances: not present Anxiety: two Headache, Fullness  in Head: very mild Agitation: somewhat more than normal activity Orientation and Clouding of Sensorium: oriented and can do serial additions CIWA-Ar Total: 9 COWS:    PATIENT STRENGTHS: (choose at least two) Ability for insight Average or above average intelligence Capable of independent living Communication skills Financial means General fund of knowledge Motivation for treatment/growth Supportive family/friends Work skills  Allergies:  Allergies  Allergen Reactions  . Shellfish Allergy Anaphylaxis    Home Medications:  (Not in a hospital admission)  OB/GYN Status:  No LMP for male patient.  General Assessment Data Location of Assessment: [facility] ED Is this a Tele or Face-to-Face Assessment?: Tele Assessment Is this an Initial Assessment or a Re-assessment for this encounter?: Initial Assessment Living Arrangements: Spouse/significant other Can pt return to current living arrangement?: Yes Admission Status: Voluntary Is patient capable of signing voluntary admission?: Yes Transfer from: Home Referral Source: Self/Family/Friend     [place] Living Arrangements: Spouse/significant other Name of Therapist: [place]  Education Status Is patient currently in school?: No  Risk to self with the past 6 months Suicidal Ideation: No Suicidal Intent: No Is patient at risk for suicide?: Yes Suicidal Plan?: No-Not Currently/Within Last 6 Months Specify Current Suicidal Plan:  (shoot himself) Access to Means: Yes Specify Access to Suicidal Means:  (gun) What has been your use of drugs/alcohol within the last 12 months?:  (alcohol) Previous Attempts/Gestures: No Other Self Harm Risks: none known Intentional Self Injurious Behavior: None Family Suicide History: No Recent stressful life event(s):  (substance abuse) Persecutory voices/beliefs?: No Depression: Yes Depression Symptoms: Insomnia, Tearfulness, Isolating, Fatigue, Guilt, Loss of interest in usual  pleasures, Feeling worthless/self pity, Feeling angry/irritable Substance abuse history and/or treatment for substance abuse?: Yes Suicide prevention information given to non-admitted patients: Not applicable  Risk to Others within the past 6 months Homicidal Ideation: No Thoughts of Harm to Others: No Current Homicidal Intent: No Current Homicidal Plan: No Access to Homicidal Means: No Identified Victim:  (none) History of harm to others?: No Assessment of Violence: None Noted Does patient have access to weapons?: Yes (Comment) (gun) Criminal Charges Pending?: No  Psychosis Hallucinations: None noted Delusions: None noted  Mental Status Report Appear/Hygiene:  (casual) Eye Contact: Good Motor Activity: Unremarkable Speech: Logical/coherent Level of Consciousness: Alert Mood: Sad, Depressed Affect: Appropriate to circumstance Anxiety Level: Minimal Thought Processes: Coherent, Relevant Judgement: Partial Orientation: Person, Place, Time, Situation Obsessive Compulsive Thoughts/Behaviors: None  Cognitive Functioning Concentration: Decreased Memory: Recent Intact, Remote Intact IQ: Average Insight: Fair Impulse Control: Fair Appetite: Fair Weight Loss: 0 Weight Gain: 0 Sleep: No Change Total Hours of Sleep: 8 Vegetative Symptoms: None  ADLScreening [facility] Assessment Services) Patient's cognitive ability adequate to safely complete daily activities?: Yes Patient able to express need for assistance with ADLs?: Yes Independently performs ADLs?: Yes (appropriate for developmental age)  Prior Inpatient  Therapy Prior Inpatient Therapy: Yes Prior Therapy Dates:  (last week 11/15) Prior Therapy Facilty/Provider(s):  [facility]) Reason for Treatment:  (detox)  Prior Outpatient Therapy Prior Outpatient Therapy: Yes Prior Therapy Dates: last week  [facility] Reason for Treatment: alcohol  ADL Screening (condition at time of  admission) Patient's cognitive ability adequate to safely complete daily activities?: Yes Is the patient deaf or have difficulty hearing?: No Does the patient have difficulty seeing, even when wearing glasses/contacts?: No Does the patient have difficulty concentrating, remembering, or making decisions?: No Patient able to express need for assistance with ADLs?: Yes Does the patient have difficulty dressing or bathing?: No Independently performs ADLs?: Yes (appropriate for developmental age) Does the patient have difficulty walking or climbing stairs?: No Weakness of Legs: None Weakness of Arms/Hands: None  Home Assistive Devices/Equipment Home Assistive Devices/Equipment: None    Abuse/Neglect Assessment (Assessment to be complete while patient is alone) Physical Abuse: Denies Verbal Abuse: Denies Sexual Abuse: Denies Exploitation of patient/patient's resources: Denies Self-Neglect: Denies Values / Beliefs Cultural Requests During Hospitalization: None Spiritual Requests During Hospitalization: None   Advance Directives (For Healthcare) Does patient have an advance directive?: No Would patient like information on creating an advanced directive?: No - patient declined information    Additional Information 1:1 In Past 12 Months?: No CIRT Risk: No Elopement Risk: No Does patient have medical clearance?: Yes     Disposition:  Disposition Initial Assessment Completed for this Encounter: Yes Disposition of Patient: Other dispositions Other disposition(s): Other (Comment) (pending psych consult)  Eri Platten Hines 07/04/2014 2:22 PM

## 2014-07-04 NOTE — ED Notes (Signed)
TSS at bedside 

## 2014-07-04 NOTE — Discharge Instructions (Signed)
Alcohol Withdrawal °Anytime drug use is interfering with normal living activities it has become abuse. This includes problems with family and friends. Psychological dependence has developed when your mind tells you that the drug is needed. This is usually followed by physical dependence when a continuing increase of drugs are required to get the same feeling or "high." This is known as addiction or chemical dependency. A person's risk is much higher if there is a history of chemical dependency in the family. °Mild Withdrawal Following Stopping Alcohol, When Addiction or Chemical Dependency Has Developed °When a person has developed tolerance to alcohol, any sudden stopping of alcohol can cause uncomfortable physical symptoms. Most of the time these are mild and consist of tremors in the hands and increases in heart rate, breathing, and temperature. Sometimes these symptoms are associated with anxiety, panic attacks, and bad dreams. There may also be stomach upset. Normal sleep patterns are often interrupted with periods of inability to sleep (insomnia). This may last for 6 months. Because of this discomfort, many people choose to continue drinking to get rid of this discomfort and to try to feel normal. °Severe Withdrawal with Decreased or No Alcohol Intake, When Addiction or Chemical Dependency Has Developed °About five percent of alcoholics will develop signs of severe withdrawal when they stop using alcohol. One sign of this is development of generalized seizures (convulsions). Other signs of this are severe agitation and confusion. This may be associated with believing in things which are not real or seeing things which are not really there (delusions and hallucinations). Vitamin deficiencies are usually present if alcohol intake has been long-term. Treatment for this most often requires hospitalization and close observation. °Addiction can only be helped by stopping use of all chemicals. This is hard but may  save your life. With continual alcohol use, possible outcomes are usually loss of self respect and esteem, violence, and death. °Addiction cannot be cured but it can be stopped. This often requires outside help and the care of professionals. Treatment centers are listed in the yellow pages under Cocaine, Narcotics, and Alcoholics Anonymous. Most hospitals and clinics can refer you to a specialized care center. °It is not necessary for you to go through the uncomfortable symptoms of withdrawal. Your caregiver can provide you with medicines that will help you through this difficult period. Try to avoid situations, friends, or drugs that made it possible for you to keep using alcohol in the past. Learn how to say no. °It takes a long period of time to overcome addictions to all drugs, including alcohol. There may be many times when you feel as though you want a drink. After getting rid of the physical addiction and withdrawal, you will have a lessening of the craving which tells you that you need alcohol to feel normal. Call your caregiver if more support is needed. Learn who to talk to in your family and among your friends so that during these periods you can receive outside help. Alcoholics Anonymous (AA) has helped many people over the years. To get further help, contact AA or call your caregiver, counselor, or clergyperson. Al-Anon and Alateen are support groups for friends and family members of an alcoholic. The people who love and care for an alcoholic often need help, too. For information about these organizations, check your phone directory or call a local alcoholism treatment center.  °SEEK IMMEDIATE MEDICAL CARE IF:  °· You have a seizure. °· You have a fever. °· You experience uncontrolled vomiting or you   vomit up blood. This may be bright red or look like black coffee grounds. °· You have blood in the stool. This may be bright red or appear as a black, tarry, bad-smelling stool. °· You become lightheaded or  faint. Do not drive if you feel this way. Have someone else drive you or call 911 for help. °· You become more agitated or confused. °· You develop uncontrolled anxiety. °· You begin to see things that are not really there (hallucinate). °Your caregiver has determined that you completely understand your medical condition, and that your mental state is back to normal. 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 °Document Released: 05/26/2005 Document Revised: 11/08/2011 Document Reviewed: 04/03/2008 °ExitCare® Patient Information ©2015 [employer]. This information is not intended to replace advice given to you by your health care provider. Make sure you discuss any questions you have with your health care provider. ° ° ° °Emergency Department Resource Guide °1) Find a Doctor and Pay Out of Pocket °Although you won't have to find out who is covered by your insurance plan, it is a good idea to ask around and get recommendations. You will then need to call the office and see if the doctor you have chosen will accept you as a new patient and what types of options they offer for patients who are self-pay. Some doctors offer discounts or will set up payment plans for their patients who do not have insurance, but you will need to ask so you aren't surprised when you get to your appointment. ° °2) Contact Your Local Health Department °Not all health departments have doctors that can see patients for sick visits, but many do, so it is worth a call to see if yours does. If you don't know where your local health department is, you can check in your phone book. The CDC also has a tool to help you locate your state's health department, and many state websites also have listings of all of their local health  departments. ° °3) Find a Walk-in Clinic °If your illness is not likely to be very severe or complicated, you may want to try a walk in clinic. These are popping up all over the country in pharmacies, drugstores, and shopping centers. They're usually staffed by nurse practitioners or physician assistants that have been trained to treat common illnesses and complaints. They're usually fairly quick and inexpensive. However, if you have serious medical issues or chronic medical problems, these are probably not your best option. ° °No Primary Care Doctor: °- Call [facility] at  832-8000 - they can help you locate a primary care doctor that  accepts your insurance, provides certain services, etc. °- Physician Referral Service- 1-800-533-3463 ° °Chronic Pain Problems: °Organization         Address  Phone   Notes  °[facility]  (336) 297-2271 Patients need to be referred by their primary care doctor.  ° °Medication Assistance: °Organization         Address  Phone   Notes  °[place] [facility] [address] °[place], [place] [address] (336) 641-8030 --Must be a resident of [place] °-- Must have NO insurance coverage whatsoever (no Medicaid/ Medicare, etc.) °-- The pt. MUST have   a primary care doctor that directs their care regularly and follows them in the community °  °[facility]  (866) 331-1348   °[employer]  (888) 892-1162   ° °Agencies that provide inexpensive medical care: °Organization         Address  Phone   Notes  °[facility]  (336) 832-8035   °[facility]    (336) 832-7272   °[facility] [address] °[place], [place] [address] (336) 832-4777   °[facility] [address], °[place] (336) 271-4999   °[facility]    (336) 373-0678   °[facility]    (336) 272-1050   °[facility] ° [address], [place] Phone:  (336)  832-4444, Fax:  (336) 832-4440 Hours of Operation:  9 am - 6 pm, M-F.  Also accepts Medicaid/Medicare and self-pay.  °[facility] ° [address], [place] Phone: (336) 832-3150, Fax: (336) 832-3151. Hours of Operation:  8:30 am - 5:30 pm, M-F.  Also accepts Medicaid and self-pay.  °[facility] [place] [address], [place] Phone: (336) 878-6027   °[facility] [address], [place], [place] (336)723-1848, Ext. 123 Mondays & Thursdays: 7-9 AM.  First 15 patients are seen on a first come, first serve basis. °  ° °Medicaid-accepting [place] Providers: ° °Organization         Address  Phone   Notes  °[facility] [address], [place] (336) 641-2100 Also accepts self-pay patients.  °[facility] [address], [place] ° (336) 856-9996   °[facility] [address], [place] (336) 288-8857   °[facility] [address] [place] [address], [place] (336) 299-7000   °Veita Bland [address], [place]  ° (336) 373-1557 Only accepts [place] Access Medicaid patients after they have their name applied to their card.  ° °Self-Pay (no insurance) in [place]: ° °Organization         Address  Phone   Notes  °Sickle Cell Patients, [facility] [address], [place] (336) 832-1970   °[facility] [address], [place] (336) 832-4400   °[facility] ° [address], [place] (336) 992-4800   °[facility]/Dr. Osei-Bonsu ° [address] [place] [address], [place] or [address], [place] (336) 841-8500 Phone number for both [place] and [place] locations is the same.  °[facility] [address] [place] [address], [place] (336) 299-0000   °[facility] [address] [place] [address], [place] or [address] (336)  852-7530 °(336) 878-2260   °[facility] [address], [place] (336) 350-1642, phone; (336) 294-5005, fax Sees patients 1st and 3rd Saturday of every month.  Must not qualify for public or private insurance (i.e. Medicaid, Medicare, [place] Health Choice, Veterans' Benefits) • Household income should be no more than 200% of the poverty level •The clinic cannot treat you if you are pregnant or think you are pregnant • Sexually transmitted diseases are not treated at the clinic.  ° ° °Dental Care: °Organization         Address  Phone  Notes  °[facility] [address],  (  336) 641-6152 Accepts children up to age 21 who are enrolled in Medicaid or [place] Health Choice; pregnant women with a Medicaid card; and children who have applied for Medicaid or [place] Health Choice, but were declined, whose parents can pay a reduced fee at time of service.  °[facility] [place]  [address], [place] (336) 641-7733 Accepts children up to age 21 who are enrolled in Medicaid or [place] Health Choice; pregnant women with a Medicaid card; and children who have applied for Medicaid or [place] Health Choice, but were declined, whose parents can pay a reduced fee at time of service.  °[facility] ° [address], [place] (336) 641-4533 Patients are seen by appointment only. Walk-ins are not accepted. [facility] will see patients 18 years of age and older. °Monday - Tuesday (8am-5pm) °Most Wednesdays (8:30-5pm) °$30 per visit, cash only  °[facility] ° [address], [place] (336) 641-4533 Patients are seen by appointment only. Walk-ins are not accepted. [facility] will see patients 18 years of age and older. °One Wednesday Evening (Monthly: Volunteer Based).  $30 per visit, cash only  °[facility]  (919) 537-3737 for adults;  Children under age 4, call Graduate Pediatric Dentistry at (919) [phone]. Children aged 4-14, please call (919) [phone] to request a pediatric application. ° Dental services are provided in all areas of dental care including fillings, crowns and bridges, complete and partial dentures, implants, gum treatment, root canals, and extractions. Preventive care is also provided. Treatment is provided to both adults and children. °Patients are selected via a lottery and there is often a waiting list. °  °[facility] [address], °[place] ° (336) 763-8833 [email or web address] °  °[facility] [address], [place], [place] (336)723-1848, Ext. 123 Second and Fourth Thursday of each month, opens at 6:30 AM; Clinic ends at 9 AM.  Patients are seen on a first-come first-served basis, and a limited number are seen during each clinic.  ° °[facility] ° [address], [place], [place] (336) 723-7904   Eligibility Requirements °You must have lived in [place], [place], or [place] counties for at least the last three months. °  You cannot be eligible for state or federal sponsored healthcare insurance, including Veterans Administration, Medicaid, or Medicare. °  You generally cannot be eligible for healthcare insurance through your employer.  °  How to apply: °Eligibility screenings are held every Tuesday and Wednesday afternoon from 1:00 pm until 4:00 pm. You do not need an appointment for the interview!  °[facility] [address], [place], [place] 336-631-2330   °[facility]  336-342-8273   °[facility]  336-703-3100   °[facility]  336-570-6415   ° °Behavioral Health Resources in the Community: °Intensive Outpatient Programs °Organization         Address  Phone  Notes  °[facility] [address], [place], [place] 336-878-6098   °[facility] [address], [place], [place] 336-832-9800   °[facility] [address], [place], [place] ° 336-882-2125   °[facility] [address],  °[place], [place] 1-800-853-5163 or 336-641-4981   °Substance Abuse Resources °Organization         Address  Phone  Notes  °[facility]    336-882-2125   °[facility]  336-784-9470   °[facility]  336-285-9073   °[facility]  336-845-3988   °Residential & Outpatient Substance Abuse Program  1-800-659-3381   °Psychological Services °Organization         Address  Phone  Notes  ° Health  336- 832-9600   °[facility]  336- 378-7881   °[facility] [address], [place] 1-800-853-5163 or 336-641-4981   ° °Mobile Crisis Teams °Organization         Address  Phone  Notes  °[facility], Mobile Crisis Care Unit  1-877-626-1772   °Assertive °[facility] ° [address] [place], [place] 336-834-9664   °Sharon DeEsch [address] °[place] 336-554-5454   ° °Self-Help/Support Groups °Organization         Address  Phone             Notes  °[facility] [place] - variety of support groups  336- 373-1402 Call for more information  °Narcotics Anonymous (NA), [facility] [address], °[place]  2 meetings at this location  ° °Residential Treatment Programs °Organization         Address  Phone  Notes  °[facility] [address],    °[place]  1-866-801-8205   °[facility] ° [address], [place], [place] 704-293-8524   °[facility] [address], [place] 336-845-3988 Admissions: 8am-3pm M-F  °[facility] [address]    °[place], [place] 336-841-1104   °[facility] [address], [place], [place] 336-379-7146   °[facility] [address]  °[place], [place] 336-285-9073   °[facility] - Intensive  Outpatient [address], [place], [place] 336-852-3033   °[facility] [address]  °[place], [place] 1-877-615-2722 or 336-784-9470   °[facility] [address] [place], [place] 336-227-7417 Accepts Medicaid  °[facility] [address]  °[place] 1-800-659-3381 Substance Abuse/Addiction Treatment  ° °[place] Behavioral Health Resources °Organization         Address  Phone  Notes  °[facility]  (888) 581-9988   °Julie Brannon, PhD [address] [place], [place]   (336) 349-5553 or (336) 951-0000   °[facility]   [address] °[place], [place] (336) 349-4454   °[facility] [address], [place], [place] (336) 342-8316 Insurance/Medicaid/sponsorship through [facility]  °[facility] [address]                                    [place], [place]  (336) 342-8316 Therapy/tele-psych/case  °[facility] [address]  ° [place], [place] (336) 349-2233    °Dr. Arfeen  (336) 349-4544   °[facility] [place]  [employer] [place] [facility] 1) [address], [place] °2) [address], [place] °3)  [address], [place] (336) 349-3220 °(336) 342-7768 ° °(336) 342-8140   °[place] [facility] (336) 342-1394 or (336) 342-3537 (After Hours)    ° ° ° °

## 2014-07-10 ENCOUNTER — Other Ambulatory Visit (HOSPITAL_COMMUNITY): Payer: Commercial Managed Care - PPO | Attending: Psychiatry | Admitting: Medical

## 2014-07-10 ENCOUNTER — Encounter (HOSPITAL_COMMUNITY): Payer: Self-pay

## 2014-07-10 DIAGNOSIS — F10229 Alcohol dependence with intoxication, unspecified: Secondary | ICD-10-CM

## 2014-07-10 DIAGNOSIS — F10239 Alcohol dependence with withdrawal, unspecified: Secondary | ICD-10-CM

## 2014-07-10 DIAGNOSIS — Z8659 Personal history of other mental and behavioral disorders: Secondary | ICD-10-CM

## 2014-07-10 NOTE — Psych (Deleted)
See note

## 2014-07-10 NOTE — Psych (Signed)
Pt was discussed in treatment planning as he had been referred to [facility] IOP.Review of records indicate [facility] admission for Alcoholic ketoacidosis drinking an admitted 1/5 per day Sept 21 followed by ED visit for alcohol and [facility] admission Oct 23-27 for detox and another ED visit Nov 5 for alcohol with admitted 8-12 beers a day.This gentleman is not adequately detoxed for an out patient program and may require a higher level of care? He cannot be detoexed as an outpatient safely in my opinion/ Brandon Melnick [facility] advised

## 2014-07-10 NOTE — Progress Notes (Signed)
Patient ID: Chad Mclaughlin, male   DOB: 09-25-1980, 33 y.o.   MRN: [id number] Pt c/o anxiety but in fact he has not been detoxes from alcohol on review of his chart.he did spend 3 days in [facility] for same end of October but he ended up back in ED 11/5 for his drinking.He has a hx og significant xanax use with alcohol (abuse) and threw away his pills.He cannot be safely detoxed as outpatient in my opinion and therefore is recommended for detoxification to be followed immeadiately by treatment.Brandon Melnick LPC [facility] IOP Director was informed.

## 2014-07-12 ENCOUNTER — Other Ambulatory Visit (HOSPITAL_COMMUNITY): Payer: Commercial Managed Care - PPO

## 2014-07-15 ENCOUNTER — Other Ambulatory Visit (HOSPITAL_COMMUNITY): Payer: Commercial Managed Care - PPO

## 2014-07-17 ENCOUNTER — Other Ambulatory Visit (HOSPITAL_COMMUNITY): Payer: Commercial Managed Care - PPO

## 2014-07-19 ENCOUNTER — Other Ambulatory Visit (HOSPITAL_COMMUNITY): Payer: Commercial Managed Care - PPO

## 2014-07-22 ENCOUNTER — Other Ambulatory Visit (HOSPITAL_COMMUNITY): Payer: Commercial Managed Care - PPO

## 2014-07-24 ENCOUNTER — Other Ambulatory Visit (HOSPITAL_COMMUNITY): Payer: Commercial Managed Care - PPO

## 2014-07-26 ENCOUNTER — Other Ambulatory Visit (HOSPITAL_COMMUNITY): Payer: Commercial Managed Care - PPO

## 2014-07-27 DIAGNOSIS — F10129 Alcohol abuse with intoxication, unspecified: Secondary | ICD-10-CM | POA: Diagnosis present

## 2014-07-28 ENCOUNTER — Emergency Department (HOSPITAL_COMMUNITY)
Admission: EM | Admit: 2014-07-28 | Discharge: 2014-07-28 | Discharge status: Against Medical Advice | Payer: Commercial Managed Care - PPO | Attending: Emergency Medicine | Admitting: Emergency Medicine

## 2014-07-28 ENCOUNTER — Emergency Department (HOSPITAL_COMMUNITY)
Admission: EM | Admit: 2014-07-28 | Discharge: 2014-07-29 | Disposition: A | Discharge status: Home / Self Care | Payer: Commercial Managed Care - PPO | Attending: Emergency Medicine | Admitting: Emergency Medicine

## 2014-07-28 ENCOUNTER — Encounter (HOSPITAL_COMMUNITY): Payer: Self-pay | Admitting: Emergency Medicine

## 2014-07-28 DIAGNOSIS — Z79899 Other long term (current) drug therapy: Secondary | ICD-10-CM | POA: Diagnosis not present

## 2014-07-28 DIAGNOSIS — R251 Tremor, unspecified: Secondary | ICD-10-CM | POA: Insufficient documentation

## 2014-07-28 DIAGNOSIS — F419 Anxiety disorder, unspecified: Secondary | ICD-10-CM | POA: Insufficient documentation

## 2014-07-28 DIAGNOSIS — F319 Bipolar disorder, unspecified: Secondary | ICD-10-CM | POA: Insufficient documentation

## 2014-07-28 DIAGNOSIS — R45851 Suicidal ideations: Secondary | ICD-10-CM

## 2014-07-28 DIAGNOSIS — F1020 Alcohol dependence, uncomplicated: Secondary | ICD-10-CM | POA: Diagnosis not present

## 2014-07-28 DIAGNOSIS — R079 Chest pain, unspecified: Secondary | ICD-10-CM | POA: Diagnosis not present

## 2014-07-28 HISTORY — DX: Depression, unspecified: F32.A

## 2014-07-28 HISTORY — DX: Major depressive disorder, single episode, unspecified: F32.9

## 2014-07-28 LAB — COMPREHENSIVE METABOLIC PANEL
ALK PHOS: 47 U/L (ref 39–117)
ALT: 56 U/L — AB (ref 0–53)
AST: 63 U/L — ABNORMAL HIGH (ref 0–37)
Albumin: 4.4 g/dL (ref 3.5–5.2)
Anion gap: 21 — ABNORMAL HIGH (ref 5–15)
BILIRUBIN TOTAL: 0.8 mg/dL (ref 0.3–1.2)
BUN: 8 mg/dL (ref 6–23)
CALCIUM: 9.4 mg/dL (ref 8.4–10.5)
CHLORIDE: 97 meq/L (ref 96–112)
CO2: 20 meq/L (ref 19–32)
Creatinine, Ser: 0.89 mg/dL (ref 0.50–1.35)
GFR calc Af Amer: >90 mL/min (ref >90)
GLUCOSE: 97 mg/dL (ref 70–99)
Potassium: 4.2 mEq/L (ref 3.7–5.3)
SODIUM: 138 meq/L (ref 137–147)
Total Protein: 7.2 g/dL (ref 6.0–8.3)

## 2014-07-28 LAB — ACETAMINOPHEN LEVEL: Acetaminophen (Tylenol), Serum: <15 ug/mL (ref 10–30)

## 2014-07-28 LAB — CBC
HCT: 44.5 % (ref 39.0–52.0)
HEMOGLOBIN: 15.6 g/dL (ref 13.0–17.0)
MCH: 32 pg (ref 26.0–34.0)
MCHC: 35.1 g/dL (ref 30.0–36.0)
MCV: 91.4 fL (ref 78.0–100.0)
Platelets: 299 10*3/uL (ref 150–400)
RBC: 4.87 MIL/uL (ref 4.22–5.81)
RDW: 12.4 % (ref 11.5–15.5)
WBC: 5.3 10*3/uL (ref 4.0–10.5)

## 2014-07-28 LAB — SALICYLATE LEVEL

## 2014-07-28 LAB — ETHANOL: Alcohol, Ethyl (B): 131 mg/dL — ABNORMAL HIGH (ref 0–11)

## 2014-07-28 MED ORDER — ONDANSETRON HCL 4 MG PO TABS
4.0000 mg | ORAL_TABLET | Freq: Three times a day (TID) | ORAL | Status: DC | PRN
Start: 2014-07-28 — End: 2014-07-29

## 2014-07-28 MED ORDER — LORAZEPAM 2 MG/ML IJ SOLN: 0.0000 mg | Freq: Two times a day (BID) | INTRAMUSCULAR | Status: DC

## 2014-07-28 MED ORDER — ACETAMINOPHEN 325 MG PO TABS: 650.0000 mg | ORAL_TABLET | ORAL | Status: DC | PRN

## 2014-07-28 MED ORDER — SODIUM CHLORIDE 0.9 % IV SOLN: 1000.0000 mL | INTRAVENOUS | Status: DC

## 2014-07-28 MED ORDER — LORAZEPAM 2 MG/ML IJ SOLN
0.0000 mg | Freq: Four times a day (QID) | INTRAMUSCULAR | Status: DC
  Administered 2014-07-29: 2 mg via INTRAVENOUS
  Filled 2014-07-28 (×2): qty 1

## 2014-07-28 MED ORDER — SODIUM CHLORIDE 0.9 % IV SOLN
1000.0000 mL | Freq: Once | INTRAVENOUS | Status: AC
  Administered 2014-07-28: 1000 mL via INTRAVENOUS

## 2014-07-28 MED ORDER — LORAZEPAM 2 MG/ML IJ SOLN
1.0000 mg | Freq: Once | INTRAMUSCULAR | Status: AC
  Administered 2014-07-28: 1 mg via INTRAVENOUS

## 2014-07-28 NOTE — ED Notes (Signed)
Pts wife ask to leave with patient, advised patient and wife he is voluntary and we are unable to force him to stay in the ED. Pt and wife walked from ED with steady gait.

## 2014-07-28 NOTE — ED Notes (Signed)
Security notified, will be up to wand pt.

## 2014-07-28 NOTE — ED Notes (Signed)
Pt transported from home after wife called EMS/[employer] c/o pt having hallucinations. Per EMS pt has hx of etoh abuse, attempted treatment in September with short sober period. Pt reports taking whole box of Benadryl 2 days ago, wanting to sleep. Pt sobbing loudly in triage, asking to be released.
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
## 2014-07-28 NOTE — BH Assessment (Signed)
Assessment complete. Gave clinical report to Glenda Chroman, NP who accepted Pt to Observation Bed 3 at [facility] under the service of Dr. Hampton Abbot. Notified Charlann Lange, PA-C who will transfer Pt via [place] once Pt is medically cleared.  Orpah Greek Rosana Hoes, [facility] Triage Specialist [phone]

## 2014-07-28 NOTE — Discharge Instructions (Signed)
TRANSFER BY [facility] TO BEHAVIORAL HEALTH OBSERVATION UNIT.

## 2014-07-28 NOTE — ED Notes (Signed)
Staffing called, is aware of needing a sitter.  Pt. Is changing into scrubs and security has been paged for wanding,.  Reported off to [employer], Rn.   Pt. Wife is with the pt.
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
## 2014-07-28 NOTE — BH Assessment (Signed)
Tele Assessment Note   Chad Mclaughlin is an 33 y.o. male, married, Caucasian who presents to [facility] ED accompanied by his wife, who did not participate in assessment. Pt reports he has a history of alcohol dependence and bipolar disorder. He reports relapsing on alcohol approximately one week ago after attending a party and has been drinking daily since. He reports drinking one 12-ounce can of beer "every two or three hours" but then also states he "drank a little vodka." Pt states he came to the ED tonight because five days ago Pt took eight tabs of Benadryl in an attempt to sleep. He reports since then he has "not felt like myself" and reports feeling very emotional with crying spells, poor sleep, decreased appetite and severe anxiety. Pt reports he is tremulous and was hyperventilating earlier. Pt believes his symptoms are related to the Benadryl and he feels he is using alcohol to manage these symptoms. Pt denies using substances other than alcohol and Benadryl; UDS is negative. Pt states his longest period of sobriety is two weeks. He denies current suicidal ideation but does report he has felt suicidal twice this week and earlier today he took a knife out of a work shed and put it in his pocket but says he doesn't know why he did it. He denies any history of suicide attempts. Pt denies homicidal ideation or history of violence. Pt denies any history of psychotic symptoms.  Pt is currently attending the IOP program at [facility] and says his next session is tomorrow. He reports Dr. Carlton Mclaughlin is prescribing his psychiatric medications, which he says are Paxil and Lamictal. Pt reports that being on leave from work to attend IOP has been stressful for him. He says he feels guilty that he has to start over with his recovery. He lives with his wife and describes her and his parents as supportive. Pt was inpatient at [facility] in October 2015 for alcohol use disorder.   Pt is dressed in hospital  scrubs, alert, oriented x4 with tremulous speech and tremulous motor behavior. Eye contact is good. Pt's mood is anxious and affect is congruent with mood. Thought process is coherent and relevant. There is no indication Pt is currently responding to internal stimuli or experiencing delusional thought content. Pt was cooperative throughout assessment. He says he would like to go home but he is agreeable to being in a hospital for observation. Charlann Lange, PA-C reports Pt's wife is very stressed due to Pt's behavior and that Pt has been hiding alcohol around the house.   Axis I: Alcohol Use Disorder, Severe; Unspecified Bipolar Disorder Axis II: Deferred Axis III:  Past Medical History  Diagnosis Date  . Anxiety   . Bipolar affective disorder   . Alcohol abuse   . Depression    Axis IV: economic problems, occupational problems and other psychosocial or environmental problems Axis V: GAF=35  Past Medical History:  Past Medical History  Diagnosis Date  . Anxiety   . Bipolar affective disorder   . Alcohol abuse   . Depression     History reviewed. No pertinent past surgical history.  Family History: No family history on file.  Social History:  reports that he has never smoked. He does not have any smokeless tobacco history on file. He reports that he drinks about 7.2 oz of alcohol per week. He reports that he does not use illicit drugs.  Additional Social History:     CIWA: CIWA-Ar BP: 130/92  mmHg Pulse Rate: 94 Nausea and Vomiting: no nausea and no vomiting Tactile Disturbances: mild itching, pins and needles, burning or numbness Tremor: moderate, with patient's arms extended Auditory Disturbances: not present Paroxysmal Sweats: beads of sweat obvious on forehead Visual Disturbances: not present Anxiety: mildly anxious Headache, Fullness in Head: none present Agitation: somewhat more than normal activity Orientation and Clouding of Sensorium: oriented and can do serial  additions CIWA-Ar Total: 12 COWS:    PATIENT STRENGTHS: (choose at least two) Ability for insight Average or above average intelligence Capable of independent living Communication skills Financial means General fund of knowledge Motivation for treatment/growth Physical Health Supportive family/friends Work skills  Allergies:  Allergies  Allergen Reactions  . Shellfish Allergy Anaphylaxis    Home Medications:  (Not in a hospital admission)  OB/GYN Status:  No LMP for male patient.  General Assessment Data Location of Assessment: [facility] ED Is this a Tele or Face-to-Face Assessment?: Tele Assessment Is this an Initial Assessment or a Re-assessment for this encounter?: Initial Assessment Living Arrangements: Spouse/significant other Can pt return to current living arrangement?: Yes Is patient capable of signing voluntary admission?: Yes Transfer from: Home Referral Source: Self/Family/Friend     [place] Living Arrangements: Spouse/significant other Name of Psychiatrist: Carlton Adam, MD Name of Therapist: Timmothy Mclaughlin at [facility]  Education Status Is patient currently in school?: No Current Grade: NA Highest grade of school patient has completed: NA Name of school: NA Contact person: NA  Risk to self with the past 6 months Suicidal Ideation: No Suicidal Intent: No Is patient at risk for suicide?: Yes Suicidal Plan?: No Specify Current Suicidal Plan: Pt denies suicide plan Access to Means: Yes Specify Access to Suicidal Means: Had knife earlier today What has been your use of drugs/alcohol within the last 12 months?: Pt using alcohol daily Previous Attempts/Gestures: No How many times?: 0 Other Self Harm Risks: None Triggers for Past Attempts: None known Intentional Self Injurious Behavior: None Family Suicide History: No Recent stressful life event(s): Financial Problems, Other (Comment) (Job stress) Persecutory voices/beliefs?: No Depression:  Yes Depression Symptoms: Despondent, Tearfulness, Loss of interest in usual pleasures Substance abuse history and/or treatment for substance abuse?: Yes Suicide prevention information given to non-admitted patients: Not applicable  Risk to Others within the past 6 months Homicidal Ideation: No Thoughts of Harm to Others: No Current Homicidal Intent: No Current Homicidal Plan: No Access to Homicidal Means: No Identified Victim: None History of harm to others?: No Assessment of Violence: None Noted Violent Behavior Description: Denies history of violence Does patient have access to weapons?: No (Guns removed from his home) Criminal Charges Pending?: No Does patient have a court date: No  Psychosis Hallucinations: None noted Delusions: None noted  Mental Status Report Appear/Hygiene: In scrubs Eye Contact: Good Motor Activity: Restlessness, Tremors Speech: Logical/coherent Level of Consciousness: Alert Mood: Anxious Affect: Anxious Anxiety Level: Moderate Thought Processes: Coherent, Relevant Judgement: Partial Orientation: Person, Place, Time, Situation Obsessive Compulsive Thoughts/Behaviors: None  Cognitive Functioning Concentration: Normal Memory: Recent Intact, Remote Intact IQ: Average Insight: Fair Impulse Control: Fair Appetite: Poor Weight Loss: 0 Weight Gain: 0 Sleep: Decreased Total Hours of Sleep: 6 Vegetative Symptoms: None  ADLScreening [facility] Assessment Services) Patient's cognitive ability adequate to safely complete daily activities?: Yes Patient able to express need for assistance with ADLs?: Yes Independently performs ADLs?: Yes (appropriate for developmental age)  Prior Inpatient Therapy Prior Inpatient Therapy: Yes Prior Therapy Dates: 06/2014; 05/2014 Prior Therapy Facilty/Provider(s): [facility] Reason for  Treatment: Alcohol dependence, bipolar disorder  Prior Outpatient Therapy Prior Outpatient Therapy: Yes Prior Therapy Dates:  Current Prior Therapy Facilty/Provider(s): [facility] IOP program Reason for Treatment: alcohol  ADL Screening (condition at time of admission) Patient's cognitive ability adequate to safely complete daily activities?: Yes Patient able to express need for assistance with ADLs?: Yes Independently performs ADLs?: Yes (appropriate for developmental age)             Advance Directives (For Healthcare) Does patient have an advance directive?: No Would patient like information on creating an advanced directive?: No - patient declined information    Additional Information 1:1 In Past 12 Months?: No CIRT Risk: No Elopement Risk: No Does patient have medical clearance?: Yes     Disposition: Gave clinical report to Glenda Chroman, NP who accepted Pt to Observation Bed 3 at [place] under the service of Dr. Hampton Abbot. Notified Charlann Lange, PA-C who will transfer Pt via [facility] once Pt is medically cleared.  Disposition Initial Assessment Completed for this Encounter: Yes   Evelena Peat, [facility], [facility] Triage Specialist [phone]   Evelena Peat 07/28/2014 10:23 PM

## 2014-07-29 ENCOUNTER — Observation Stay (HOSPITAL_COMMUNITY)
Admission: AD | Admit: 2014-07-29 | Discharge: 2014-07-29 | Disposition: A | Discharge status: Home / Self Care | Payer: Commercial Managed Care - PPO | Source: Intra-hospital | Attending: Psychiatry | Admitting: Psychiatry

## 2014-07-29 ENCOUNTER — Other Ambulatory Visit (HOSPITAL_COMMUNITY): Payer: Commercial Managed Care - PPO

## 2014-07-29 ENCOUNTER — Encounter (HOSPITAL_COMMUNITY): Payer: Self-pay | Admitting: *Deleted

## 2014-07-29 DIAGNOSIS — F419 Anxiety disorder, unspecified: Secondary | ICD-10-CM | POA: Insufficient documentation

## 2014-07-29 DIAGNOSIS — Z91013 Allergy to seafood: Secondary | ICD-10-CM | POA: Insufficient documentation

## 2014-07-29 DIAGNOSIS — F1014 Alcohol abuse with alcohol-induced mood disorder: Principal | ICD-10-CM | POA: Insufficient documentation

## 2014-07-29 DIAGNOSIS — F1020 Alcohol dependence, uncomplicated: Secondary | ICD-10-CM | POA: Diagnosis present

## 2014-07-29 DIAGNOSIS — F319 Bipolar disorder, unspecified: Secondary | ICD-10-CM | POA: Diagnosis present

## 2014-07-29 MED ORDER — ONDANSETRON 4 MG PO TBDP: 4.0000 mg | ORAL_TABLET | Freq: Four times a day (QID) | ORAL | Status: DC | PRN

## 2014-07-29 MED ORDER — TRAZODONE HCL 50 MG PO TABS
ORAL_TABLET | ORAL | Status: AC
  Filled 2014-07-29: qty 1

## 2014-07-29 MED ORDER — THIAMINE HCL 100 MG/ML IJ SOLN
100.0000 mg | Freq: Once | INTRAMUSCULAR | Status: AC
  Administered 2014-07-29: 100 mg via INTRAMUSCULAR
  Filled 2014-07-29: qty 2

## 2014-07-29 MED ORDER — CLONIDINE HCL 0.1 MG PO TABS
0.1000 mg | ORAL_TABLET | Freq: Four times a day (QID) | ORAL | Status: DC
  Administered 2014-07-29: 0.1 mg via ORAL
  Filled 2014-07-29 (×6): qty 1

## 2014-07-29 MED ORDER — CHLORDIAZEPOXIDE HCL 25 MG PO CAPS: 25.0000 mg | ORAL_CAPSULE | Freq: Every day | ORAL | Status: DC

## 2014-07-29 MED ORDER — CHLORDIAZEPOXIDE HCL 25 MG PO CAPS
25.0000 mg | ORAL_CAPSULE | Freq: Four times a day (QID) | ORAL | Status: DC
  Administered 2014-07-29 (×3): 25 mg via ORAL
  Filled 2014-07-29 (×4): qty 1

## 2014-07-29 MED ORDER — HYDROXYZINE HCL 25 MG PO TABS
25.0000 mg | ORAL_TABLET | Freq: Four times a day (QID) | ORAL | Status: DC | PRN
  Administered 2014-07-29: 25 mg via ORAL
  Filled 2014-07-29: qty 1

## 2014-07-29 MED ORDER — DICYCLOMINE HCL 20 MG PO TABS: 20.0000 mg | ORAL_TABLET | Freq: Four times a day (QID) | ORAL | Status: DC | PRN

## 2014-07-29 MED ORDER — LAMOTRIGINE 25 MG PO TABS
25.0000 mg | ORAL_TABLET | Freq: Two times a day (BID) | ORAL | Status: DC
  Administered 2014-07-29: 25 mg via ORAL
  Filled 2014-07-29 (×4): qty 1

## 2014-07-29 MED ORDER — CHLORDIAZEPOXIDE HCL 25 MG PO CAPS: 25.0000 mg | ORAL_CAPSULE | ORAL | Status: DC

## 2014-07-29 MED ORDER — CHLORDIAZEPOXIDE HCL 25 MG PO CAPS: 25.0000 mg | ORAL_CAPSULE | Freq: Four times a day (QID) | ORAL | Status: DC | PRN

## 2014-07-29 MED ORDER — HYDROXYZINE HCL 25 MG PO TABS: 25.0000 mg | ORAL_TABLET | Freq: Four times a day (QID) | ORAL | Status: DC | PRN

## 2014-07-29 MED ORDER — PAROXETINE HCL 20 MG PO TABS
40.0000 mg | ORAL_TABLET | Freq: Every day | ORAL | Status: DC
  Administered 2014-07-29: 40 mg via ORAL
  Filled 2014-07-29: qty 2
  Filled 2014-07-29: qty 4
  Filled 2014-07-29: qty 2

## 2014-07-29 MED ORDER — LOPERAMIDE HCL 2 MG PO CAPS: 2.0000 mg | ORAL_CAPSULE | ORAL | Status: DC | PRN

## 2014-07-29 MED ORDER — PAROXETINE HCL 40 MG PO TABS: 40.0000 mg | ORAL_TABLET | Freq: Every day | ORAL | Status: DC

## 2014-07-29 MED ORDER — NAPROXEN 500 MG PO TABS: 500.0000 mg | ORAL_TABLET | Freq: Two times a day (BID) | ORAL | Status: DC | PRN

## 2014-07-29 MED ORDER — VITAMIN B-1 100 MG PO TABS
100.0000 mg | ORAL_TABLET | Freq: Every day | ORAL | Status: DC
  Filled 2014-07-29: qty 1

## 2014-07-29 MED ORDER — CHLORDIAZEPOXIDE HCL 25 MG PO CAPS: 25.0000 mg | ORAL_CAPSULE | Freq: Three times a day (TID) | ORAL | Status: DC

## 2014-07-29 MED ORDER — METHOCARBAMOL 500 MG PO TABS
500.0000 mg | ORAL_TABLET | Freq: Three times a day (TID) | ORAL | Status: DC | PRN
Start: 2014-07-29 — End: 2014-07-29
  Administered 2014-07-29: 500 mg via ORAL
  Filled 2014-07-29: qty 1

## 2014-07-29 MED ORDER — CLONIDINE HCL 0.1 MG PO TABS: 0.1000 mg | ORAL_TABLET | Freq: Every day | ORAL | Status: DC

## 2014-07-29 MED ORDER — TRAZODONE HCL 50 MG PO TABS
50.0000 mg | ORAL_TABLET | Freq: Every evening | ORAL | Status: DC | PRN
  Administered 2014-07-29: 50 mg via ORAL

## 2014-07-29 MED ORDER — ADULT MULTIVITAMIN W/MINERALS CH
1.0000 | ORAL_TABLET | Freq: Every day | ORAL | Status: DC
  Administered 2014-07-29: 1 via ORAL
  Filled 2014-07-29 (×3): qty 1

## 2014-07-29 MED ORDER — TRAZODONE HCL 50 MG PO TABS: 50.0000 mg | ORAL_TABLET | Freq: Every evening | ORAL | Status: DC | PRN

## 2014-07-29 MED ORDER — CLONIDINE HCL 0.1 MG PO TABS: 0.1000 mg | ORAL_TABLET | ORAL | Status: DC

## 2014-07-29 NOTE — Progress Notes (Signed)
Pt admitted to Observation unit requesting help with alcohol abuse. Pt presents shaking uncontrollablly and anxiety. Denies SI/HI @ present to Probation officer. Verbally contracts for safety. -[facility]. Denies physical pain. Admits to earlier SI and having a knife "I was thinking about it". Pt reports having difficulty staying sober after discharge. Emotional support and encouragement given. Will monitor closely and evaluate for stabilization.

## 2014-07-29 NOTE — Progress Notes (Signed)
[facility] INPATIENT:  Family/Significant Other Suicide Prevention Education  Suicide Prevention Education:  Patient Refusal for Family/Significant Other Suicide Prevention Education: The patient Chad Mclaughlin has refused to provide written consent for family/significant other to be provided Family/Significant Other Suicide Prevention Education during admission and/or prior to discharge.   Apolinar Junes 07/29/2014, 1:32 AM

## 2014-07-29 NOTE — Progress Notes (Signed)
Patient ID: Chad Mclaughlin, male   DOB: 1980-10-29, 33 y.o.   MRN: [id number] Patient discharged per MD orders. Patient given education regarding follow-up appointments and medications. Patient denies any questions or concerns about these instructions. Patient was escorted to locker and given belongings before discharge to hospital lobby. Patient currently denies SI/HI and auditory and visual hallucinations on discharge.

## 2014-07-29 NOTE — Discharge Summary (Signed)
[facility] OBS UNIT DISCHARGE SUMMARY  Subjective:  Pt seen and chart reviewed. Pt reports that he is doing much better although he is very tremulous at rest. Pt may be overstating his improvement. He is in agreement to wait in the OBS Unit until his CIWA score improves (currently 8-11 range with Temor at 5). Pt denies all other symptoms aside from tremor and this is not his baseline. Pt denies SI, HI, and AVH, and is requesting outpatient resources. He has a wife, job, stable home environment, and support system .   HPI:  Chad Mclaughlin is an 33 y.o. male, married, Caucasian who presents to [facility] ED accompanied by his wife, who did not participate in assessment. Pt reports he has a history of alcohol dependence and bipolar disorder. He reports relapsing on alcohol approximately one week ago after attending a party and has been drinking daily since. He reports drinking one 12-ounce can of beer "every two or three hours" but then also states he "drank a little vodka." Pt states he came to the ED tonight because five days ago Pt took eight tabs of Benadryl in an attempt to sleep. He reports since then he has "not felt like myself" and reports feeling very emotional with crying spells, poor sleep, decreased appetite and severe anxiety. Pt reports he is tremulous and was hyperventilating earlier. Pt believes his symptoms are related to the Benadryl and he feels he is using alcohol to manage these symptoms. Pt denies using substances other than alcohol and Benadryl; UDS is negative. Pt states his longest period of sobriety is two weeks. He denies current suicidal ideation but does report he has felt suicidal twice this week and earlier today he took a knife out of a work shed and put it in his pocket but says he doesn't know why he did it. He denies any history of suicide attempts. Pt denies homicidal ideation or history of violence. Pt denies any history of psychotic symptoms.  Pt is currently attending the IOP  program at [facility] and says his next session is tomorrow. He reports Dr. Carlton Adam is prescribing his psychiatric medications, which he says are Paxil and Lamictal. Pt reports that being on leave from work to attend IOP has been stressful for him. He says he feels guilty that he has to start over with his recovery. He lives with his wife and describes her and his parents as supportive. Pt was inpatient at [facility] in October 2015 for alcohol use disorder.   Pt is dressed in hospital scrubs, alert, oriented x4 with tremulous speech and tremulous motor behavior. Eye contact is good. Pt's mood is anxious and affect is congruent with mood. Thought process is coherent and relevant. There is no indication Pt is currently responding to internal stimuli or experiencing delusional thought content. Pt was cooperative throughout assessment. He says he would like to go home but he is agreeable to being in a hospital for observation. Charlann Lange, PA-C reports Pt's wife is very stressed due to Pt's behavior and that Pt has been hiding alcohol around the house.   Axis I: Alcohol Abuse and Substance Induced Mood Disorder Axis II: Deferred Axis III:  Past Medical History  Diagnosis Date  . Anxiety   . Bipolar affective disorder   . Alcohol abuse   . Depression    Axis IV: economic problems, occupational problems and other psychosocial or environmental problems Axis V: 51-60 moderate symptoms   Psychiatric Specialty Exam:  Physical  Exam  ROS  Blood pressure 137/83, pulse 80, temperature 97.7 F (36.5 C), temperature source Oral, resp. rate 18, height 5\' 5"  (1.651 m), weight 67.132 kg (148 lb).Body mass index is 24.63 kg/(m^2).  General Appearance: Casual  Eye Contact::  Good  Speech:  Clear and Coherent  Volume:  Normal  Mood:  Anxious  Affect:  Appropriate and Congruent  Thought Process:  Coherent and Goal Directed  Orientation:  Full (Time, Place, and Person)  Thought Content:  WDL   Suicidal Thoughts:  No  Homicidal Thoughts:  No  Memory:  Immediate;   Fair Recent;   Fair Remote;   Fair  Judgement:  Fair  Insight:  Good  Psychomotor Activity:  Increased  Concentration:  Good  Recall:  Good  Akathisia:  No  Handed:  Right  AIMS (if indicated):     Assets:  Communication Skills Desire for Improvement Resilience  Sleep:         Past Medical History:  Past Medical History  Diagnosis Date  . Anxiety   . Bipolar affective disorder   . Alcohol abuse   . Depression     History reviewed. No pertinent past surgical history.  Family History: History reviewed. No pertinent family history.  Social History:  reports that he has never smoked. He does not have any smokeless tobacco history on file. He reports that he drinks about 7.2 oz of alcohol per week. He reports that he does not use illicit drugs.  Additional Social History:  Alcohol / Drug Use Pain Medications: none noted Prescriptions: none noted Over the Counter: Pt reports taking excessive amount of Bendryl History of alcohol / drug use?: Yes Longest period of sobriety (when/how long): 2 weeks Negative Consequences of Use: Financial, Personal relationships, Work / Youth worker Withdrawal Symptoms: Tremors, Nausea / Vomiting Substance #1 Name of Substance 1: alcohol 1 - Age of First Use: 22 1 - Amount (size/oz): 8 beers per day plus unknown amount of vodka 1 - Frequency: daily 1 - Duration: Relapsed one week ago 1 - Last Use / Amount: 07/28/14, three beers  CIWA: CIWA-Ar BP: 132/89 mmHg Pulse Rate: 80 Nausea and Vomiting: no nausea and no vomiting Tactile Disturbances: none Tremor: five Auditory Disturbances: not present Paroxysmal Sweats: no sweat visible Visual Disturbances: not present Anxiety: three Headache, Fullness in Head: none present Agitation: normal activity Orientation and Clouding of Sensorium: oriented and can do serial additions CIWA-Ar Total: 8 COWS:    PATIENT STRENGTHS:  (choose at least two) Ability for insight Average or above average intelligence Capable of independent living Communication skills Financial means General fund of knowledge Motivation for treatment/growth Physical Health Supportive family/friends Work skills  Allergies:  Allergies  Allergen Reactions  . Shellfish Allergy Anaphylaxis    Home Medications:  Medications Prior to Admission  Medication Sig Dispense Refill  . lamoTRIgine (LAMICTAL) 25 MG tablet Take 1 tablet (25 mg total) by mouth daily. (Patient taking differently: Take 25 mg by mouth 2 (two) times daily. ) 30 tablet 0  . LORazepam (ATIVAN) 1 MG tablet Take one 1 mg tablet at 5 pm and bedtime on 06/25/14. Take one tablet in am and at bedtime on 06/26/14. Then one tablet in the morning on 06/27/14 then stop. (Patient not taking: Reported on 07/28/2014) 5 tablet 0  . Multiple Vitamin (MULTIVITAMIN WITH MINERALS) TABS tablet Take 1 tablet by mouth daily. May purchase over the counter to improve general health.    Marland Kitchen PARoxetine (PAXIL) 20 MG tablet Take  1 tablet (20 mg total) by mouth daily. (Patient not taking: Reported on 07/28/2014) 30 tablet 0  . [DISCONTINUED] PARoxetine (PAXIL) 40 MG tablet Take 40 mg by mouth daily.   0   Plan: Discharge home with outpatient resources for counseling, psychiatry, and alcohol abuse. TTS to assist with this planning.   Benjamine Mola, FNP-BC 07/29/2014 4:50PM

## 2014-07-29 NOTE — H&P (Signed)
[name] OBS UNIT H&P  Subjective:  Pt seen and chart reviewed. Pt reports that he is doing much better although he is very tremulous at rest. Pt may be overstating his improvement. He is in agreement to wait in the OBS Unit until his CIWA score improves (currently 8-11 range with Temor at 5). Pt denies all other symptoms aside from tremor and this is not his baseline. Pt denies SI, HI, and AVH, and is requesting outpatient resources. He has a wife, job, stable home environment, and support system .   HPI:  Chad Mclaughlin is an 33 y.o. male, married, Caucasian who presents to [facility] ED accompanied by his wife, who did not participate in assessment. Pt reports he has a history of alcohol dependence and bipolar disorder. He reports relapsing on alcohol approximately one week ago after attending a party and has been drinking daily since. He reports drinking one 12-ounce can of beer "every two or three hours" but then also states he "drank a little vodka." Pt states he came to the ED tonight because five days ago Pt took eight tabs of Benadryl in an attempt to sleep. He reports since then he has "not felt like myself" and reports feeling very emotional with crying spells, poor sleep, decreased appetite and severe anxiety. Pt reports he is tremulous and was hyperventilating earlier. Pt believes his symptoms are related to the Benadryl and he feels he is using alcohol to manage these symptoms. Pt denies using substances other than alcohol and Benadryl; UDS is negative. Pt states his longest period of sobriety is two weeks. He denies current suicidal ideation but does report he has felt suicidal twice this week and earlier today he took a knife out of a work shed and put it in his pocket but says he doesn't know why he did it. He denies any history of suicide attempts. Pt denies homicidal ideation or history of violence. Pt denies any history of psychotic symptoms.  Pt is currently attending the IOP program at [facility] and says his next session is tomorrow. He reports Dr. Carlton Adam is prescribing his psychiatric medications, which he says are Paxil and Lamictal. Pt reports that being on leave from work to attend IOP has been stressful for him. He says he feels guilty that he has to start over with his recovery. He lives with his wife and describes her and his parents as supportive. Pt was inpatient at [facility] in October 2015 for alcohol use disorder.   Pt is dressed in hospital scrubs, alert, oriented x4 with tremulous speech and tremulous motor behavior. Eye contact is good. Pt's mood is anxious and affect is congruent with mood. Thought process is coherent and relevant. There is no indication Pt is currently responding to internal stimuli or experiencing delusional thought content. Pt was cooperative throughout assessment. He says he would like to go home but he is agreeable to being in a hospital for observation. Charlann Lange, PA-C reports Pt's wife is very stressed due to Pt's behavior and that Pt has been hiding alcohol around the house.   Axis I: Alcohol Abuse and Substance Induced Mood Disorder Axis II: Deferred Axis III:  Past Medical History  Diagnosis Date  . Anxiety   . Bipolar affective disorder   . Alcohol abuse   . Depression    Axis IV: economic problems, occupational problems and other psychosocial or environmental problems Axis V: 51-60 moderate symptoms   Psychiatric Specialty Exam:  Physical Exam  ROS  Blood pressure 137/83, pulse 80, temperature 97.7 F (36.5 C), temperature source Oral, resp. rate 18, height 5\' 5"  (1.651 m), weight 67.132 kg (148 lb).Body mass index is 24.63 kg/(m^2).  General Appearance: Casual  Eye Contact::  Good  Speech:  Clear and Coherent  Volume:  Normal  Mood:  Anxious  Affect:  Appropriate and Congruent  Thought Process:  Coherent and Goal Directed  Orientation:  Full (Time, Place, and Person)  Thought Content:  WDL  Suicidal Thoughts:  No   Homicidal Thoughts:  No  Memory:  Immediate;   Fair Recent;   Fair Remote;   Fair  Judgement:  Fair  Insight:  Good  Psychomotor Activity:  Increased  Concentration:  Good  Recall:  Good  Akathisia:  No  Handed:  Right  AIMS (if indicated):     Assets:  Communication Skills Desire for Improvement Resilience  Sleep:         Past Medical History:  Past Medical History  Diagnosis Date  . Anxiety   . Bipolar affective disorder   . Alcohol abuse   . Depression     History reviewed. No pertinent past surgical history.  Family History: History reviewed. No pertinent family history.  Social History:  reports that he has never smoked. He does not have any smokeless tobacco history on file. He reports that he drinks about 7.2 oz of alcohol per week. He reports that he does not use illicit drugs.  Additional Social History:  Alcohol / Drug Use Pain Medications: none noted Prescriptions: none noted Over the Counter: Pt reports taking excessive amount of Bendryl History of alcohol / drug use?: Yes Longest period of sobriety (when/how long): 2 weeks Negative Consequences of Use: Financial, Personal relationships, Work / Youth worker Withdrawal Symptoms: Tremors, Nausea / Vomiting Substance #1 Name of Substance 1: alcohol 1 - Age of First Use: 22 1 - Amount (size/oz): 8 beers per day plus unknown amount of vodka 1 - Frequency: daily 1 - Duration: Relapsed one week ago 1 - Last Use / Amount: 07/28/14, three beers  CIWA: CIWA-Ar BP: 132/89 mmHg Pulse Rate: 80 Nausea and Vomiting: no nausea and no vomiting Tactile Disturbances: none Tremor: five Auditory Disturbances: not present Paroxysmal Sweats: no sweat visible Visual Disturbances: not present Anxiety: three Headache, Fullness in Head: none present Agitation: normal activity Orientation and Clouding of Sensorium: oriented and can do serial additions CIWA-Ar Total: 8 COWS:    PATIENT STRENGTHS: (choose at least  two) Ability for insight Average or above average intelligence Capable of independent living Communication skills Financial means General fund of knowledge Motivation for treatment/growth Physical Health Supportive family/friends Work skills  Allergies:  Allergies  Allergen Reactions  . Shellfish Allergy Anaphylaxis    Home Medications:  Medications Prior to Admission  Medication Sig Dispense Refill  . lamoTRIgine (LAMICTAL) 25 MG tablet Take 1 tablet (25 mg total) by mouth daily. (Patient taking differently: Take 25 mg by mouth 2 (two) times daily. ) 30 tablet 0  . LORazepam (ATIVAN) 1 MG tablet Take one 1 mg tablet at 5 pm and bedtime on 06/25/14. Take one tablet in am and at bedtime on 06/26/14. Then one tablet in the morning on 06/27/14 then stop. (Patient not taking: Reported on 07/28/2014) 5 tablet 0  . Multiple Vitamin (MULTIVITAMIN WITH MINERALS) TABS tablet Take 1 tablet by mouth daily. May purchase over the counter to improve general health.    Marland Kitchen PARoxetine (PAXIL) 20 MG tablet Take 1 tablet (  20 mg total) by mouth daily. (Patient not taking: Reported on 07/28/2014) 30 tablet 0  . [DISCONTINUED] PARoxetine (PAXIL) 40 MG tablet Take 40 mg by mouth daily.   0   Plan: Discharge home with outpatient resources for counseling, psychiatry, and alcohol abuse. TTS to assist with this planning.   Benjamine Mola, FNP-BC 07/29/2014 9:44AM

## 2014-07-29 NOTE — Plan of Care (Addendum)
[facility] Observation Crisis Plan  Reason for Crisis Plan:  Substance Abuse   Plan of Care:  Referral for Substance Abuse  Family Support:    Wife, parents, step-father, friends  Current Living Environment:  Living Arrangements: Spouse/significant other (pt can return to household)  Insurance:  [facility] Account    Name Acct ID Class Status Primary Coverage   Kalel, Harty [id number] [place] - UMR/UHC PPO        Guarantor Account (for Hospital Account [id number])    Name Relation to Pt Service Area Active? Acct Type   Latanya Maudlin Self [facility]   Address Phone       [address] [place], [place] [address] [phone])          Coverage Information (for Hospital Account [id number])    F/O Payor/Plan Precert #   [facility] HEALTHCARE/UMR/UHC PPO    Subscriber Subscriber #   Adaiah, Morken [id number]   Address [place]   [address] [place] [address] [phone]      Legal Guardian:   Self  Primary Care Provider:  No primary care provider on file.  Current Outpatient Providers:  [facility]  Psychiatrist:   Dr. Silvio Pate at the [facility]  Counselor/Therapist:    [facility] with Medications:  No (has been compliant other that during relapse)  Additional Information: After consulting with Catalina Pizza, NP it has been determined that pt does not present a life threatening danger to himself or others, and that psychiatric hospitalization is not indicated for him at this time.  Pt would, however, benefit from referral to a substance abuse treatment program.  Pt reports that he does not want to be admitted to a residential program and that, in fact, he is currently participating in treatment at the [facility].  This includes both substance abuse programming and psychiatry.  Pt will be advised to continue this program.   Discharge instructions will include contact information for the [facility].  Jalene Mullet, [facility] Triage Specialist Abbe Amsterdam 11/30/20152:34 PM

## 2014-07-29 NOTE — Discharge Instructions (Signed)
In order to maintain a sober lifestyle a substance abuse treatment program may be beneficial to you.  You have indicated that you currently participate in a treatment program at the [facility], where you also receive psychiatry.  You are advised to continue this program:       [facility]      [place], [place] [address]      [phone]

## 2014-07-29 NOTE — Plan of Care (Signed)
[facility] Observation Crisis Plan  Reason for Crisis Plan:  Substance Abuse   Plan of Care:  Referral for IOP  Family Support:    wife Chad Mclaughlin  Current Living Environment:  Living Arrangements: Spouse/significant other  Insurance:   Hospital Account    Name Acct ID Class Status Primary Coverage   Chad Mclaughlin, Chad Mclaughlin [id number] Chad Mclaughlin - UMR/UHC PPO        Guarantor Account (for Hospital Account [id number])    Name Relation to Pt Service Area Active? Acct Type   Chad Mclaughlin Self [facility]   Address Phone       [address] [place], [place] [address] [phone]) [phone])          Coverage Information (for Hospital Account [id number])    F/O Payor/Plan Precert #   [facility] HEALTHCARE/UMR/UHC PPO    Subscriber Subscriber #   Chad Mclaughlin, Chad Mclaughlin [id number]   Address [place] [address] [address] [phone]      Legal Guardian:     Primary Care Provider:  No primary care provider on file.  Current Outpatient Providers:  [facility]  Psychiatrist:   "Cant remember name"  Counselor/Therapist:     Compliant with Medications:  Yes  Additional Information:   Chad Mclaughlin 11/30/20151:27 AM

## 2014-07-29 NOTE — Progress Notes (Signed)
Nsg shift assessment. 7a-7p  D: Pt has been having observable alcohol symptoms this morning with visible tremors and shakiness.  He does not complain much and would like to go home to continue his treatment OP. Pt said that he had been in recovery, but relapsed, thinking that he would have only one drink. He drank for 5 to 7 days having 6 to 12 beers and vodka during that time. He denies SI/HI. His wife, with whom he lives, continues to be supportive.  A:  Support and encouragement offered. Safety checks completed every 15 minutes.  R: Pt is being cooperative and following the treatment plan.   D: Pt would become shaky whenever he got near anyone. His shakiness increased when his BP was being assessed. Pt said that he has a chronic problem with anxiety. 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 He states that he is ready to go home and his wife is picking him up. Discharge instructions read to pt who signed for same.   A: Escorted pt to the lobby where his wife will pick him up.   R:  No complaints voiced. Pt is ready to go home.

## 2014-07-31 ENCOUNTER — Other Ambulatory Visit (HOSPITAL_COMMUNITY): Payer: Commercial Managed Care - PPO

## 2014-08-02 ENCOUNTER — Other Ambulatory Visit (HOSPITAL_COMMUNITY): Payer: Commercial Managed Care - PPO

## 2014-08-05 ENCOUNTER — Other Ambulatory Visit (HOSPITAL_COMMUNITY): Payer: Commercial Managed Care - PPO

## 2014-08-07 ENCOUNTER — Other Ambulatory Visit (HOSPITAL_COMMUNITY): Payer: Commercial Managed Care - PPO

## 2014-08-09 ENCOUNTER — Other Ambulatory Visit (HOSPITAL_COMMUNITY): Payer: Commercial Managed Care - PPO

## 2014-08-11 ENCOUNTER — Encounter (HOSPITAL_COMMUNITY): Payer: Self-pay | Admitting: *Deleted

## 2014-08-11 ENCOUNTER — Emergency Department (HOSPITAL_COMMUNITY)
Admission: EM | Admit: 2014-08-11 | Discharge: 2014-08-12 | Disposition: A | Discharge status: Other Acute Inpatient Hospital | Payer: Commercial Managed Care - PPO | Attending: Emergency Medicine | Admitting: Emergency Medicine

## 2014-08-11 DIAGNOSIS — F319 Bipolar disorder, unspecified: Secondary | ICD-10-CM | POA: Insufficient documentation

## 2014-08-11 DIAGNOSIS — F10220 Alcohol dependence with intoxication, uncomplicated: Secondary | ICD-10-CM | POA: Diagnosis not present

## 2014-08-11 DIAGNOSIS — F419 Anxiety disorder, unspecified: Secondary | ICD-10-CM | POA: Insufficient documentation

## 2014-08-11 DIAGNOSIS — Z008 Encounter for other general examination: Secondary | ICD-10-CM | POA: Diagnosis present

## 2014-08-11 DIAGNOSIS — Z79899 Other long term (current) drug therapy: Secondary | ICD-10-CM | POA: Diagnosis not present

## 2014-08-11 LAB — COMPREHENSIVE METABOLIC PANEL
ALK PHOS: 54 U/L (ref 39–117)
ALT: 31 U/L (ref 0–53)
AST: 39 U/L — ABNORMAL HIGH (ref 0–37)
Albumin: 4.1 g/dL (ref 3.5–5.2)
Anion gap: 19 — ABNORMAL HIGH (ref 5–15)
BUN: 9 mg/dL (ref 6–23)
CO2: 22 meq/L (ref 19–32)
Calcium: 9.3 mg/dL (ref 8.4–10.5)
Chloride: 102 mEq/L (ref 96–112)
Creatinine, Ser: 0.89 mg/dL (ref 0.50–1.35)
GLUCOSE: 145 mg/dL — AB (ref 70–99)
POTASSIUM: 4 meq/L (ref 3.7–5.3)
SODIUM: 143 meq/L (ref 137–147)
Total Bilirubin: 0.3 mg/dL (ref 0.3–1.2)
Total Protein: 7.1 g/dL (ref 6.0–8.3)

## 2014-08-11 LAB — CBC
HCT: 44.1 % (ref 39.0–52.0)
Hemoglobin: 15.8 g/dL (ref 13.0–17.0)
MCH: 32.2 pg (ref 26.0–34.0)
MCHC: 35.8 g/dL (ref 30.0–36.0)
MCV: 89.8 fL (ref 78.0–100.0)
Platelets: 316 10*3/uL (ref 150–400)
RBC: 4.91 MIL/uL (ref 4.22–5.81)
RDW: 12.2 % (ref 11.5–15.5)
WBC: 4.8 10*3/uL (ref 4.0–10.5)

## 2014-08-11 LAB — RAPID URINE DRUG SCREEN, HOSP PERFORMED
AMPHETAMINES: NOT DETECTED
BARBITURATES: NOT DETECTED
Benzodiazepines: NOT DETECTED
Cocaine: NOT DETECTED
Opiates: NOT DETECTED
TETRAHYDROCANNABINOL: NOT DETECTED

## 2014-08-11 LAB — ETHANOL: Alcohol, Ethyl (B): 351 mg/dL — ABNORMAL HIGH (ref 0–11)

## 2014-08-11 LAB — SALICYLATE LEVEL: Salicylate Lvl: <2 mg/dL — ABNORMAL LOW (ref 2.8–20.0)

## 2014-08-11 LAB — ACETAMINOPHEN LEVEL: Acetaminophen (Tylenol), Serum: <15 ug/mL (ref 10–30)

## 2014-08-11 MED ORDER — PAROXETINE HCL 20 MG PO TABS
40.0000 mg | ORAL_TABLET | Freq: Every day | ORAL | Status: DC
  Administered 2014-08-12: 40 mg via ORAL
  Filled 2014-08-11 (×2): qty 2

## 2014-08-11 MED ORDER — TRAZODONE HCL 50 MG PO TABS: 50.0000 mg | ORAL_TABLET | Freq: Every evening | ORAL | Status: DC | PRN

## 2014-08-11 MED ORDER — NICOTINE 21 MG/24HR TD PT24
21.0000 mg | MEDICATED_PATCH | Freq: Every day | TRANSDERMAL | Status: DC
  Filled 2014-08-11: qty 1

## 2014-08-11 MED ORDER — ONDANSETRON HCL 4 MG PO TABS: 4.0000 mg | ORAL_TABLET | Freq: Three times a day (TID) | ORAL | Status: DC | PRN

## 2014-08-11 MED ORDER — VITAMIN B-1 100 MG PO TABS
100.0000 mg | ORAL_TABLET | Freq: Every day | ORAL | Status: DC
  Administered 2014-08-11 – 2014-08-12 (×2): 100 mg via ORAL
  Filled 2014-08-11 (×2): qty 1

## 2014-08-11 MED ORDER — ZOLPIDEM TARTRATE 5 MG PO TABS
5.0000 mg | ORAL_TABLET | Freq: Every evening | ORAL | Status: DC | PRN
  Administered 2014-08-12: 5 mg via ORAL
  Filled 2014-08-11: qty 1

## 2014-08-11 MED ORDER — LORAZEPAM 2 MG/ML IJ SOLN: 0.0000 mg | Freq: Four times a day (QID) | INTRAMUSCULAR | Status: DC

## 2014-08-11 MED ORDER — THIAMINE HCL 100 MG/ML IJ SOLN: 100.0000 mg | Freq: Every day | INTRAMUSCULAR | Status: DC

## 2014-08-11 MED ORDER — LORAZEPAM 2 MG/ML IJ SOLN
0.0000 mg | Freq: Two times a day (BID) | INTRAMUSCULAR | Status: DC
Start: 2014-08-13 — End: 2014-08-12

## 2014-08-11 MED ORDER — LORAZEPAM 1 MG PO TABS
1.0000 mg | ORAL_TABLET | Freq: Three times a day (TID) | ORAL | Status: DC | PRN
  Administered 2014-08-11: 1 mg via ORAL

## 2014-08-11 MED ORDER — ACETAMINOPHEN 325 MG PO TABS: 650.0000 mg | ORAL_TABLET | ORAL | Status: DC | PRN

## 2014-08-11 MED ORDER — LORAZEPAM 1 MG PO TABS: 0.0000 mg | ORAL_TABLET | Freq: Two times a day (BID) | ORAL | Status: DC

## 2014-08-11 MED ORDER — LAMOTRIGINE 25 MG PO TABS
25.0000 mg | ORAL_TABLET | Freq: Every day | ORAL | Status: DC
  Administered 2014-08-12: 25 mg via ORAL
  Filled 2014-08-11 (×2): qty 1

## 2014-08-11 MED ORDER — IBUPROFEN 200 MG PO TABS: 600.0000 mg | ORAL_TABLET | Freq: Three times a day (TID) | ORAL | Status: DC | PRN

## 2014-08-11 MED ORDER — HYDROXYZINE HCL 25 MG PO TABS
25.0000 mg | ORAL_TABLET | Freq: Four times a day (QID) | ORAL | Status: DC | PRN
  Administered 2014-08-12 (×2): 25 mg via ORAL
  Filled 2014-08-11 (×2): qty 1

## 2014-08-11 MED ORDER — LORAZEPAM 1 MG PO TABS
0.0000 mg | ORAL_TABLET | Freq: Four times a day (QID) | ORAL | Status: DC
Start: 2014-08-11 — End: 2014-08-12
  Administered 2014-08-11 – 2014-08-12 (×2): 1 mg via ORAL
  Administered 2014-08-12 (×2): 2 mg via ORAL
  Filled 2014-08-11: qty 2
  Filled 2014-08-11: qty 1
  Filled 2014-08-11: qty 2
  Filled 2014-08-11 (×2): qty 1
  Filled 2014-08-11: qty 2

## 2014-08-11 MED ORDER — ALUM & MAG HYDROXIDE-SIMETH 200-200-20 MG/5ML PO SUSP: 30.0000 mL | ORAL | Status: DC | PRN

## 2014-08-11 NOTE — ED Provider Notes (Signed)
CSN: [id number]     Arrival date & time 08/11/14  1904 History   First MD Initiated Contact with Patient 08/11/14 2015     Chief Complaint  Patient presents with  . Psychiatric Evaluation     (Consider location/radiation/quality/duration/timing/severity/associated sxs/prior Treatment) HPI Comments: Began drinking again. Started 2 days ago. States he feels a tremot and almost a craving for it. He's had over 14 drinks today, and has been passing out from drinking for the past few days. He completed an inpatient alcohol program 1 month ago and did outpatient alcohol detox 2 weeks ago.   Patient is a 33 y.o. male presenting with alcohol problem. The history is provided by the patient.  Alcohol Problem This is a recurrent problem. The current episode started more than 2 days ago. The problem occurs constantly. The problem has not changed since onset.Pertinent negatives include no abdominal pain and no shortness of breath. Nothing aggravates the symptoms. Nothing relieves the symptoms. He has tried nothing for the symptoms.    Past Medical History  Diagnosis Date  . Anxiety   . Bipolar affective disorder   . Alcohol abuse   . Depression    History reviewed. No pertinent past surgical history. No family history on file. History  Substance Use Topics  . Smoking status: Never Smoker   . Smokeless tobacco: Not on file  . Alcohol Use: 7.2 oz/week    12 Cans of beer per week     Comment: daily     Review of Systems  Constitutional: Negative for fever.  Respiratory: Negative for cough and shortness of breath.   Gastrointestinal: Negative for vomiting and abdominal pain.  All other systems reviewed and are negative.     Allergies  Shellfish allergy  Home Medications   Prior to Admission medications   Medication Sig Start Date End Date Taking? Authorizing Provider  hydrOXYzine (ATARAX/VISTARIL) 25 MG tablet Take 1 tablet (25 mg total) by mouth every 6 (six) hours as needed for  anxiety (or withdrawal). 07/29/14  Yes Benjamine Mola, FNP  lamoTRIgine (LAMICTAL) 25 MG tablet Take 1 tablet (25 mg total) by mouth daily. Patient taking differently: Take 25 mg by mouth 2 (two) times daily.  06/25/14  Yes Elmarie Shiley, NP  Multiple Vitamin (MULTIVITAMIN WITH MINERALS) TABS tablet Take 1 tablet by mouth daily. May purchase over the counter to improve general health. 06/25/14  Yes Elmarie Shiley, NP  PARoxetine (PAXIL) 40 MG tablet Take 1 tablet (40 mg total) by mouth daily. 07/29/14  Yes Benjamine Mola, FNP  traZODone (DESYREL) 50 MG tablet Take 1 tablet (50 mg total) by mouth at bedtime as needed for sleep. 07/29/14  Yes John C Withrow, FNP   BP 130/98 mmHg  Pulse 150  Temp(Src) 97.7 F (36.5 C) (Oral)  Resp 26  Ht 5\' 5"  (1.651 m)  Wt 150 lb (68.04 kg)  BMI 24.96 kg/m2  SpO2 96% Physical Exam  Constitutional: He is oriented to person, place, and time. He appears well-developed and well-nourished. No distress.  HENT:  Head: Normocephalic and atraumatic.  Mouth/Throat: Oropharynx is clear and moist. No oropharyngeal exudate.  Eyes: EOM are normal. Pupils are equal, round, and reactive to light.  Neck: Normal range of motion. Neck supple.  Cardiovascular: Normal rate and regular rhythm.  Exam reveals no friction rub.   No murmur heard. Pulmonary/Chest: Effort normal and breath sounds normal. No respiratory distress. He has no wheezes. He has no rales.  Abdominal: Soft. He exhibits  no distension. There is no tenderness. There is no rebound.  Musculoskeletal: Normal range of motion. He exhibits no edema.  Neurological: He is alert and oriented to person, place, and time.  Skin: No rash noted. He is not diaphoretic.  Nursing note and vitals reviewed.   ED Course  Procedures (including critical care time) Labs Review Labs Reviewed  CBC  ACETAMINOPHEN LEVEL  COMPREHENSIVE METABOLIC PANEL  ETHANOL  SALICYLATE LEVEL  URINE RAPID DRUG SCREEN (HOSP PERFORMED)     Imaging Review No results found.   EKG Interpretation None      MDM   Final diagnoses:  Alcohol dependence with uncomplicated intoxication    9M here with alcohol problem. Will consult TTS. No SI/HI. Recurrent detox programs over the past month. Is somewhat stressed about returning to work. 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  Psych evaluated and spoke to wife. Wife reported to TTS that he was in the garage with scissors to his neck. Patient denied suicidality to me.  Intoxicated by labs, will need re-eval in the morning by Psych per TTS extender. Patient is not IVC'd at this time, he is aware he needs to stay.  Evelina Bucy, MD 08/12/14 [phone]

## 2014-08-11 NOTE — ED Notes (Signed)
Staffing office called for sitter. Patient wife is taking all of patient personal items with her, patient has OK this.
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
## 2014-08-11 NOTE — ED Notes (Signed)
Pt. Called family before calling hours were up
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
## 2014-08-11 NOTE — BH Assessment (Addendum)
Spoke with Dr. Mingo Amber prior to initiating assessment. He is 33 has been using etoh, has done inpt rehab a month ago, and OP two weeks ago. Did not like AA. Was sober 7 days after inpt, 10 days after OP. Reports tremors, worried about going back to work.  Pt feels worthless, reports he told his wife to leave him because he "Does not have his shit together."   Requested cart be placed with pt for assessment.   Assessment to commence shortly.    Lear Ng, [facility] Triage Specialist 08/11/2014 9:12 PM

## 2014-08-11 NOTE — BH Assessment (Addendum)
Per Waylan Boga, NP pt should be reassessed when sober to determine if he still meets inpt criteria. Referral to [facility] will be made per pt request.   Attempted to inform Dr. Mingo Amber on plan to reassess but phone rolled over. Attempted 3 other times and was unable to reach him will contact after writing up assessment.   Informed Erin RN of plan for pt to be reassessed in AM and referral to [facility] to be initiated per pt request.    2234 Informed Dr. Mingo Amber of plan and he is in agreement.   Lear Ng, [facility] Triage Specialist 08/11/2014 9:59 PM

## 2014-08-12 ENCOUNTER — Encounter (HOSPITAL_COMMUNITY): Payer: Self-pay

## 2014-08-12 ENCOUNTER — Inpatient Hospital Stay (HOSPITAL_COMMUNITY)
Admission: EM | Admit: 2014-08-12 | Discharge: 2014-08-16 | DRG: 897 | Disposition: A | Discharge status: Home / Self Care | Payer: Commercial Managed Care - PPO | Source: Intra-hospital | Attending: Psychiatry | Admitting: Psychiatry

## 2014-08-12 ENCOUNTER — Other Ambulatory Visit (HOSPITAL_COMMUNITY): Payer: Commercial Managed Care - PPO

## 2014-08-12 DIAGNOSIS — F411 Generalized anxiety disorder: Secondary | ICD-10-CM | POA: Diagnosis present

## 2014-08-12 DIAGNOSIS — F10239 Alcohol dependence with withdrawal, unspecified: Secondary | ICD-10-CM | POA: Diagnosis present

## 2014-08-12 DIAGNOSIS — Y909 Presence of alcohol in blood, level not specified: Secondary | ICD-10-CM

## 2014-08-12 DIAGNOSIS — F10939 Alcohol use, unspecified with withdrawal, unspecified: Secondary | ICD-10-CM | POA: Diagnosis present

## 2014-08-12 DIAGNOSIS — F10188 Alcohol abuse with other alcohol-induced disorder: Secondary | ICD-10-CM | POA: Diagnosis present

## 2014-08-12 DIAGNOSIS — F1020 Alcohol dependence, uncomplicated: Secondary | ICD-10-CM | POA: Diagnosis present

## 2014-08-12 DIAGNOSIS — Z79899 Other long term (current) drug therapy: Secondary | ICD-10-CM | POA: Diagnosis not present

## 2014-08-12 DIAGNOSIS — F10220 Alcohol dependence with intoxication, uncomplicated: Secondary | ICD-10-CM | POA: Diagnosis not present

## 2014-08-12 DIAGNOSIS — F1994 Other psychoactive substance use, unspecified with psychoactive substance-induced mood disorder: Secondary | ICD-10-CM | POA: Diagnosis present

## 2014-08-12 DIAGNOSIS — F10230 Alcohol dependence with withdrawal, uncomplicated: Secondary | ICD-10-CM | POA: Insufficient documentation

## 2014-08-12 DIAGNOSIS — F10988 Alcohol use, unspecified with other alcohol-induced disorder: Secondary | ICD-10-CM

## 2014-08-12 MED ORDER — LORAZEPAM 1 MG PO TABS
2.0000 mg | ORAL_TABLET | Freq: Once | ORAL | Status: AC
Start: 2014-08-12 — End: 2014-08-12
  Administered 2014-08-12: 2 mg via ORAL

## 2014-08-12 MED ORDER — LAMOTRIGINE 25 MG PO TABS
25.0000 mg | ORAL_TABLET | Freq: Two times a day (BID) | ORAL | Status: DC
  Administered 2014-08-12 – 2014-08-14 (×4): 25 mg via ORAL
  Filled 2014-08-12 (×9): qty 1

## 2014-08-12 MED ORDER — THIAMINE HCL 100 MG/ML IJ SOLN: 100.0000 mg | Freq: Once | INTRAMUSCULAR | Status: DC

## 2014-08-12 MED ORDER — ACETAMINOPHEN 325 MG PO TABS: 650.0000 mg | ORAL_TABLET | Freq: Four times a day (QID) | ORAL | Status: DC | PRN

## 2014-08-12 MED ORDER — LOPERAMIDE HCL 2 MG PO CAPS: 2.0000 mg | ORAL_CAPSULE | ORAL | Status: AC | PRN

## 2014-08-12 MED ORDER — HYDROXYZINE HCL 25 MG PO TABS
25.0000 mg | ORAL_TABLET | Freq: Four times a day (QID) | ORAL | Status: AC | PRN
  Administered 2014-08-12 – 2014-08-15 (×5): 25 mg via ORAL
  Filled 2014-08-12 (×6): qty 1

## 2014-08-12 MED ORDER — MAGNESIUM HYDROXIDE 400 MG/5ML PO SUSP: 30.0000 mL | Freq: Every day | ORAL | Status: DC | PRN

## 2014-08-12 MED ORDER — CHLORDIAZEPOXIDE HCL 25 MG PO CAPS
25.0000 mg | ORAL_CAPSULE | Freq: Four times a day (QID) | ORAL | Status: DC
  Administered 2014-08-12 – 2014-08-13 (×3): 25 mg via ORAL
  Filled 2014-08-12 (×4): qty 1

## 2014-08-12 MED ORDER — ONDANSETRON 4 MG PO TBDP: 4.0000 mg | ORAL_TABLET | Freq: Four times a day (QID) | ORAL | Status: AC | PRN

## 2014-08-12 MED ORDER — ADULT MULTIVITAMIN W/MINERALS CH
1.0000 | ORAL_TABLET | Freq: Every day | ORAL | Status: DC
  Administered 2014-08-13 – 2014-08-16 (×4): 1 via ORAL
  Filled 2014-08-12 (×6): qty 1

## 2014-08-12 MED ORDER — ALUM & MAG HYDROXIDE-SIMETH 200-200-20 MG/5ML PO SUSP: 30.0000 mL | ORAL | Status: DC | PRN

## 2014-08-12 MED ORDER — CHLORDIAZEPOXIDE HCL 25 MG PO CAPS
25.0000 mg | ORAL_CAPSULE | Freq: Four times a day (QID) | ORAL | Status: DC | PRN
  Administered 2014-08-13: 25 mg via ORAL

## 2014-08-12 MED ORDER — TRAZODONE HCL 50 MG PO TABS
50.0000 mg | ORAL_TABLET | Freq: Every evening | ORAL | Status: DC | PRN
  Administered 2014-08-12: 50 mg via ORAL
  Filled 2014-08-12: qty 1

## 2014-08-12 MED ORDER — CHLORDIAZEPOXIDE HCL 25 MG PO CAPS: 25.0000 mg | ORAL_CAPSULE | Freq: Every day | ORAL | Status: DC

## 2014-08-12 MED ORDER — CHLORDIAZEPOXIDE HCL 25 MG PO CAPS: 25.0000 mg | ORAL_CAPSULE | Freq: Three times a day (TID) | ORAL | Status: DC

## 2014-08-12 MED ORDER — CHLORDIAZEPOXIDE HCL 25 MG PO CAPS: 25.0000 mg | ORAL_CAPSULE | ORAL | Status: DC

## 2014-08-12 MED ORDER — PAROXETINE HCL 20 MG PO TABS
40.0000 mg | ORAL_TABLET | Freq: Every day | ORAL | Status: DC
  Administered 2014-08-13 – 2014-08-16 (×4): 40 mg via ORAL
  Filled 2014-08-12 (×4): qty 2
  Filled 2014-08-12: qty 4
  Filled 2014-08-12: qty 2

## 2014-08-12 MED ORDER — VITAMIN B-1 100 MG PO TABS
100.0000 mg | ORAL_TABLET | Freq: Every day | ORAL | Status: DC
  Administered 2014-08-13 – 2014-08-16 (×4): 100 mg via ORAL
  Filled 2014-08-12 (×6): qty 1

## 2014-08-12 NOTE — ED Provider Notes (Signed)
Accepted to [facility] per [place] for ETOH detox.  Vitals improving.  [place], DO 08/12/14 2109

## 2014-08-12 NOTE — ED Notes (Signed)
Pt using phone to place call at this time.

## 2014-08-12 NOTE — Progress Notes (Signed)
Pt assessed by Dr. Janina Mayo and is requesting detox. Pt clinicals faxed to [facility] for possible acceptance.  Charlene Brooke, MSW  Social Worker [phone]

## 2014-08-12 NOTE — ED Notes (Signed)
Patient states increased anxiety and restlessness, patient given meds to combat symptoms,

## 2014-08-12 NOTE — ED Provider Notes (Signed)
Patient presenting for alcohol detox. He is tremulous with mild tachycardia. Additional Ativan given. Per nurse, CIWA have been 12-14. Psychiatry agrees he needs detox.   Symptoms controlled at this time. If he clinically worsens he may need medical detox.  HR 100, BP 134/91      Ezequiel Essex, MD 08/12/14 [phone]

## 2014-08-12 NOTE — ED Notes (Signed)
pts wife at bedside 
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
## 2014-08-12 NOTE — ED Notes (Signed)
Ice water given per pt. Request

## 2014-08-13 ENCOUNTER — Encounter (HOSPITAL_COMMUNITY): Payer: Self-pay | Admitting: Psychiatry

## 2014-08-13 DIAGNOSIS — F1994 Other psychoactive substance use, unspecified with psychoactive substance-induced mood disorder: Secondary | ICD-10-CM | POA: Diagnosis present

## 2014-08-13 DIAGNOSIS — F1099 Alcohol use, unspecified with unspecified alcohol-induced disorder: Secondary | ICD-10-CM

## 2014-08-13 DIAGNOSIS — F411 Generalized anxiety disorder: Secondary | ICD-10-CM

## 2014-08-13 DIAGNOSIS — F321 Major depressive disorder, single episode, moderate: Secondary | ICD-10-CM

## 2014-08-13 MED ORDER — LORAZEPAM 1 MG PO TABS
1.0000 mg | ORAL_TABLET | Freq: Two times a day (BID) | ORAL | Status: DC
  Administered 2014-08-16: 1 mg via ORAL
  Filled 2014-08-13: qty 1

## 2014-08-13 MED ORDER — LORAZEPAM 1 MG PO TABS
1.0000 mg | ORAL_TABLET | Freq: Four times a day (QID) | ORAL | Status: DC | PRN
  Administered 2014-08-15: 1 mg via ORAL
  Filled 2014-08-13: qty 1

## 2014-08-13 MED ORDER — LORAZEPAM 1 MG PO TABS: 1.0000 mg | ORAL_TABLET | Freq: Every day | ORAL | Status: DC

## 2014-08-13 MED ORDER — LORAZEPAM 1 MG PO TABS
1.0000 mg | ORAL_TABLET | Freq: Three times a day (TID) | ORAL | Status: AC
  Administered 2014-08-15 (×3): 1 mg via ORAL
  Filled 2014-08-13 (×3): qty 1

## 2014-08-13 MED ORDER — TRAZODONE HCL 100 MG PO TABS
100.0000 mg | ORAL_TABLET | Freq: Every evening | ORAL | Status: DC | PRN
  Administered 2014-08-13 – 2014-08-14 (×2): 100 mg via ORAL
  Filled 2014-08-13 (×2): qty 1

## 2014-08-13 MED ORDER — LORAZEPAM 1 MG PO TABS
1.0000 mg | ORAL_TABLET | Freq: Four times a day (QID) | ORAL | Status: AC
  Administered 2014-08-13 – 2014-08-14 (×6): 1 mg via ORAL
  Filled 2014-08-13 (×5): qty 1

## 2014-08-13 NOTE — Progress Notes (Signed)
D    Pt is anxious and having some mild to moderate tremors   His mood has improved he says    He is cooperative and pleasant and interacts appropriately with other pts A   Verbal support given   Medications administered and effectiveness monitored   Q 15 min checks  Educate on detox symptoms and medications R   Pt safe and receptive to verbal support   Pt verbalized understanding

## 2014-08-13 NOTE — BHH Group Notes (Signed)
[facility] Group Notes:  (Nursing/MHT/Case Management/Adjunct)  Date:  08/13/2014  Time:  9:00am  Type of Therapy:  Nurse Education  Participation Level:  Active  Participation Quality:  Attentive  Affect:  Anxious, Depressed and Tearful  Cognitive:  Alert  Insight:  Improving  Engagement in Group:  Engaged  Modes of Intervention:  Discussion, Education and Support  Summary of Progress/Problems: Patient attended and while tearful was engaged in discussion about recovery, ways of caring for oneself to stay well.  Jamie Kato 08/13/2014, 5:33 PM

## 2014-08-13 NOTE — Tx Team (Signed)
Initial Interdisciplinary Treatment Plan   PATIENT STRESSORS: Substance abuse   PATIENT STRENGTHS: General fund of knowledge Supportive family/friends   PROBLEM LIST: Problem List/Patient Goals Date to be addressed Date deferred Reason deferred Estimated date of resolution   ETOH abuse/ detox 08/13/14     Risk for suicide 08/13/14     Medication adjustment 08/13/14                                          DISCHARGE CRITERIA:  Improved stabilization in mood, thinking, and/or behavior Verbal commitment to aftercare and medication compliance  PRELIMINARY DISCHARGE PLAN: Attend aftercare/continuing care group Attend PHP/IOP Outpatient therapy  PATIENT/FAMIILY INVOLVEMENT: This treatment plan has been presented to and reviewed with the patient, Chad Mclaughlin.  The patient and family have been given the opportunity to ask questions and make suggestions.  Oriel, Rumbold A 08/13/2014, 12:25 AM

## 2014-08-13 NOTE — BHH Suicide Risk Assessment (Signed)
[facility] INPATIENT:  Family/Significant Other Suicide Prevention Education  Suicide Prevention Education:  Education Completed; Chad Mclaughlin (Pt's mother) [phone] has been identified by the patient as the family member/significant other with whom the patient will be residing, and identified as the person(s) who will aid the patient in the event of a mental health crisis (suicidal ideations/suicide attempt).  With written consent from the patient, the family member/significant other has been provided the following suicide prevention education, prior to the and/or following the discharge of the patient.  The suicide prevention education provided includes the following:  Suicide risk factors  Suicide prevention and interventions  National Suicide Hotline telephone number  [facility] assessment telephone number  [facility] Emergency Assistance [place] and/or Residential Mobile Crisis Unit telephone number  Request made of family/significant other to:  Remove weapons (e.g., guns, rifles, knives), all items previously/currently identified as safety concern.    Remove drugs/medications (over-the-counter, prescriptions, illicit drugs), all items previously/currently identified as a safety concern.  The family member/significant other verbalizes understanding of the suicide prevention education information provided.  The family member/significant other agrees to remove the items of safety concern listed above.  Chad Mclaughlin, Chad Mclaughlin LCSWA  08/13/2014, 1:26 PM
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
## 2014-08-13 NOTE — BHH Suicide Risk Assessment (Signed)
Suicide Risk Assessment  Admission Assessment     Nursing information obtained from:    Demographic factors:    Current Mental Status:    Loss Factors:    Historical Factors:    Risk Reduction Factors:    Total Time spent with patient: 45 minutes  CLINICAL FACTORS:   Severe Anxiety and/or Agitation Depression:   Comorbid alcohol abuse/dependence Alcohol/Substance Abuse/Dependencies  COGNITIVE FEATURES THAT CONTRIBUTE TO RISK:  Closed-mindedness Polarized thinking Thought constriction (tunnel vision)    SUICIDE RISK:   Moderate:   PLAN OF CARE: Supportive approach/coping skills/relapse prevention                               Assess and address the co morbidities  I certify that inpatient services furnished can reasonably be expected to improve the patient's condition.  Chad Mclaughlin 08/13/2014, 5:28 PM

## 2014-08-13 NOTE — Progress Notes (Signed)
Received TC from patient's mother (Ms. Virgina Jock) who states IOP through the [facility] is not an option as patient has tried this many times. She would like for him to receive treatment through [facility]. Mother given LCSW's name and encouraged her to touch base with CM. Mother states patient is so helpless that he is having difficulty processing potential discharge plan. Jamie Kato

## 2014-08-13 NOTE — BHH Counselor (Signed)
Adult Comprehensive Assessment  Patient ID: Chad Mclaughlin, male DOB: 01-14-1981, 33 y.o. MRN: [id number]  Information Source: Information source: Patient  Current Stressors:  Educational / Learning stressors: NA Employment / Job issues: Pt expresses concern re multiple company layoffs of late and states he does want to loose his position Family Relationships: NA Financial / Lack of resources (include bankruptcy): Pt reports "at times I will stress over student loans and bills but really all is manageable" Housing / Lack of housing: NA Physical health (include injuries & life threatening diseases): NA Social relationships: "Good friends yet often times our purpose for socializing is alcohol" Substance abuse: Ongoing issues w alcohol  Bereavement / Loss: NA  Living/Environment/Situation:  Living Arrangements: Spouse/significant other Living conditions (as described by patient or guardian): Nice home How long has patient lived in current situation?: several years What is atmosphere in current home: Comfortable, Quarry manager, Supportive  Family History:  Marital status: Married Number of Years Married: 1 What types of issues is patient dealing with in the relationship?: Concern regarding patient's alcohol use Additional relationship information: Otherwise wife is supportive Does patient have children?: No  Childhood History:  By whom was/is the patient raised?: Mother, Father Additional childhood history information: Patient reports time was shared between parents, most of time was spent with father Description of patient's relationship with caregiver when they were a child: "fine with both" Patient's description of current relationship with people who raised him/her: Very good; close with both Does patient have siblings?: No Did patient suffer any verbal/emotional/physical/sexual abuse as a child?: No Did patient suffer from severe childhood neglect?: No Has patient ever been  sexually abused/assaulted/raped as an adolescent or adult?: No Was the patient ever a victim of a crime or a disaster?: No Witnessed domestic violence?: No Has patient been effected by domestic violence as an adult?: No  Education:  Highest grade of school patient has completed: Completed Counsellor in [place] Currently a Ship broker?: No Learning disability?: No  Employment/Work Situation:  Employment situation: Employed Where is patient currently employed?: [employer] How long has patient been employed?: 3 years Patient's job has been impacted by current illness: No (Patient reports some difficult days yet believes he has kept up w work) What is the longest time patient has a held a job?: Current position Has patient ever been in the [employer]?: No Has patient ever served in combat?: No  Financial Resources:  Financial resources: Income from employment, Income from spouse Does patient have a representative payee or guardian?: No  Alcohol/Substance Abuse:  What has been your use of drugs/alcohol within the last 12 months?: 2-4 beers and 8-10 shots of Vodka on daily basis Alcohol/Substance Abuse Treatment Hx: Past detox at [facility] 06/22/14-06/25/14 and few weeks ago at [facility].  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 Patient open to enrolling in an outpatient program) Does patient have financial barriers related to discharge medications?: No  Summary/Recommendations:  Pt is 33 year old male living in [place], [place] ([place]) with his wife and 3 dogs. Pt presents to [facility] voluntarily due to SI with plan, ETOH detox, depression/mood instability, and for medication stabilization. Pt has recent admission to [facility] (06/22/14-06/25/14) for ETOH detox. Pt reports that he relapsed shortly after discharge and was admitted to [facility] a few weeks ago, but was sent home. Pt currently denies SI/HI/AVH. He reports moderate withdrawals and is currently on Librium taper. Pt tearful and observably anxious. Recommendations for pt include: crisis stabilization, therapeutic milieu, encourage group attendance and participation, librium taper for withdrawals, medication management for mood stabilization, and development of comprehensive mental wellness/sobriety plan. Pt unsure if he wants to go directly into inpatient treatment. Pt requesting to return home at d/c and continue with CDIOP at the [facility]. CSW providing pt with information to [facility] and [facility]. Pt encouraged to share info with his family and is aware of the difficulty in getting into inpatient treatment once he discharges from the hospital if he chooses to wait. CSW assessing.     Smart, [place] LCSWA 08/13/2014

## 2014-08-13 NOTE — Progress Notes (Signed)
Patient is quite tearful, sad and distressed this morning. "I blew it again. I keep doing that. I just can't believe it." Pt helpless, hopeless though denies SI. Patient reports depression is an 8/10, hopelessness is a 5/10 and anxiety is a 10/10. Reports his goal today is to get into [facility] and he plans to do this by "following up on the paper work sent to them ([facility]) yesterday." Explained to patient he would need to detox first before pursuing any further treatment. Explained protocol to him. Offered support, reassurance and discussed coping skills to aid him with his feelings of guilt and regret. Medicated per orders. Vistaril given prn. Patient receptive to help/learning. Some decrease in anxiety noted post vistaril. Patient has been up and visible in the milieu, attending groups. He denies AVH/HI. Will continue to monitor closely. Jamie Kato
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
## 2014-08-13 NOTE — Clinical Social Work Note (Signed)
CSW called Gwyndolyn Saxon at [facility] back. Pt denied due to hx of SI and extent of MH issues. Referral also sent to [facility] who are currently reviewing pt. CSW called family earlier to discuss the above referrals and will call back tomorrow AM with updates.  [place], LCSWA 08/13/2014 3:12 PM
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
## 2014-08-14 ENCOUNTER — Other Ambulatory Visit (HOSPITAL_COMMUNITY): Payer: Commercial Managed Care - PPO

## 2014-08-14 MED ORDER — LAMOTRIGINE 25 MG PO TABS
50.0000 mg | ORAL_TABLET | Freq: Two times a day (BID) | ORAL | Status: DC
  Administered 2014-08-14 – 2014-08-16 (×4): 50 mg via ORAL
  Filled 2014-08-14 (×6): qty 2

## 2014-08-14 NOTE — BHH Group Notes (Signed)
Adult Psychoeducational Group Note  Date:  08/14/2014 Time:  5:16 AM  Group Topic/Focus:  Wrap-up Group  Participation Level:  Active  Participation Quality:  Appropriate  Affect:  Anxious  Cognitive:  Appropriate  Insight: Good  Engagement in Group:  Engaged  Modes of Intervention:  Discussion and Support  Additional Comments:  Pt stated that today was a rough day for him but was happy about moving halls.  Pt could not explain what contributed to the outcome of the day but feels like he needs and has to go to long-term tx in order to get better.  Rick Duff Coursey 08/14/2014, 5:16 AM

## 2014-08-14 NOTE — BHH Group Notes (Signed)
   [facility] LCSW Aftercare Discharge Planning Group Note  08/14/2014  8:45 AM   Participation Quality: Alert, Appropriate and Oriented  Mood/Affect: Depressed and Flat  Depression Rating: 7/8  Anxiety Rating: 9  Thoughts of Suicide: Pt denies SI/HI  Will you contract for safety? Yes  Current AVH: Pt denies  Plan for Discharge/Comments: Pt attended discharge planning group and actively participated in group. CSW provided pt with today's workbook. Patient reports feeling tired this morning. He is requesting residential treatment at this time before returning home.  Transportation Means: Pt reports access to transportation  Supports: Patient has identified his wife as a support.  Tilden Fossa, MSW, [place] Worker [facility] [phone]

## 2014-08-14 NOTE — Progress Notes (Signed)
[facility] MD Progress Note  08/14/2014 12:09 PM Chad Mclaughlin  MRN:  [id number] Subjective:  Settling down. He does admit that his mood instability is not all secondary to his alcohol use. He describes episodes of mood fluctuation where he has more energy than usual, gets more impulsive, spends money. But does admit that as of lately he has not have a lot of sobriety. He states that the episode with the scissors was distorted and that he never had the scissors to his neck. His wife attests to that. He also admits to persistent anxiety, day in day out and that his use of alcohol helps to deal with the anxiety Diagnosis:   DSM5: Substance/Addictive Disorders:  Alcohol Related Disorder - Severe (303.90) Depressive Disorders:  Major Depressive Disorder - Moderate (296.22) Total Time spent with patient: 30 minutes  Axis I: Generalized Anxiety Disorder, Mood Disorder NOS and Substance Induced Mood Disorder  ADL's:  Intact  Sleep: Fair  Appetite:  Fair  Psychiatric Specialty Exam: Physical Exam  Review of Systems  Constitutional: Negative.   HENT: Negative.   Eyes: Negative.   Respiratory: Negative.   Cardiovascular: Negative.   Gastrointestinal: Negative.   Genitourinary: Negative.   Musculoskeletal: Negative.   Skin: Negative.   Neurological: Negative.   Endo/Heme/Allergies: Negative.   Psychiatric/Behavioral: Positive for depression and substance abuse. The patient is nervous/anxious.     Blood pressure 130/96, pulse 95, temperature 97.9 F (36.6 C), temperature source Oral, resp. rate 16, height 5\' 2"  (1.575 m), weight 63.504 kg (140 lb).Body mass index is 25.6 kg/(m^2).  General Appearance: Fairly Groomed  Engineer, water::  Fair  Speech:  Clear and Coherent  Volume:  Normal  Mood:  Anxious  Affect:  anxious worried  Thought Process:  Coherent and Goal Directed  Orientation:  Full (Time, Place, and Person)  Thought Content:  symptoms events worries concerns  Suicidal Thoughts:  No   Homicidal Thoughts:  No  Memory:  Immediate;   Fair Recent;   Fair Remote;   Fair  Judgement:  Fair  Insight:  Present  Psychomotor Activity:  Restlessness  Concentration:  Fair  Recall:  [employer] of Knowledge:NA  Language: Fair  Akathisia:  No  Handed:    AIMS (if indicated):     Assets:  Desire for Improvement Housing Social Support Vocational/Educational  Sleep:  Number of Hours: 6.5   Musculoskeletal: Strength & Muscle Tone: within normal limits Gait & Station: normal Patient leans: [name]  Current Medications: Current Facility-Administered Medications  Medication Dose Route Frequency Provider Last Rate Last Dose  . acetaminophen (TYLENOL) tablet 650 mg  650 mg Oral Q6H PRN Laverle Hobby, PA-C      . alum & mag hydroxide-simeth (MAALOX/MYLANTA) 200-200-20 MG/5ML suspension 30 mL  30 mL Oral Q4H PRN Laverle Hobby, PA-C      . hydrOXYzine (ATARAX/VISTARIL) tablet 25 mg  25 mg Oral Q6H PRN Laverle Hobby, PA-C   25 mg at 08/13/14 0901  . lamoTRIgine (LAMICTAL) tablet 50 mg  50 mg Oral BID Nicholaus Bloom, MD      . loperamide (IMODIUM) capsule 2-4 mg  2-4 mg Oral PRN Laverle Hobby, PA-C      . LORazepam (ATIVAN) tablet 1 mg  1 mg Oral Q6H PRN Nicholaus Bloom, MD      . LORazepam (ATIVAN) tablet 1 mg  1 mg Oral QID Nicholaus Bloom, MD   1 mg at 08/14/14 3875   Followed by  . [  START ON 08/15/2014] LORazepam (ATIVAN) tablet 1 mg  1 mg Oral TID Nicholaus Bloom, MD       Followed by  . [START ON 08/16/2014] LORazepam (ATIVAN) tablet 1 mg  1 mg Oral BID Nicholaus Bloom, MD       Followed by  . [START ON 08/17/2014] LORazepam (ATIVAN) tablet 1 mg  1 mg Oral Daily Nicholaus Bloom, MD      . magnesium hydroxide (MILK OF MAGNESIA) suspension 30 mL  30 mL Oral Daily PRN Laverle Hobby, PA-C      . multivitamin with minerals tablet 1 tablet  1 tablet Oral Daily Laverle Hobby, PA-C   1 tablet at 08/14/14 [phone]  . ondansetron (ZOFRAN-ODT) disintegrating tablet 4 mg  4 mg Oral Q6H PRN  Laverle Hobby, PA-C      . PARoxetine (PAXIL) tablet 40 mg  40 mg Oral Daily Laverle Hobby, PA-C   40 mg at 08/14/14 8938  . thiamine (B-1) injection 100 mg  100 mg Intramuscular Once Laverle Hobby, PA-C   100 mg at 08/12/14 2319  . thiamine (VITAMIN B-1) tablet 100 mg  100 mg Oral Daily Laverle Hobby, PA-C   100 mg at 08/14/14 1017  . traZODone (DESYREL) tablet 100 mg  100 mg Oral QHS PRN Nicholaus Bloom, MD   100 mg at 08/13/14 2129    Lab Results: No results found for this or any previous visit (from the past 48 hour(s)).  Physical Findings: AIMS: Facial and Oral Movements Muscles of Facial Expression: None, normal Lips and Perioral Area: None, normal Jaw: None, normal Tongue: None, normal,Extremity Movements Upper (arms, wrists, hands, fingers): None, normal Lower (legs, knees, ankles, toes): None, normal, Trunk Movements Neck, shoulders, hips: None, normal, Overall Severity Severity of abnormal movements (highest score from questions above): None, normal Incapacitation due to abnormal movements: None, normal Patient's awareness of abnormal movements (rate only patient's report): No Awareness, Dental Status Current problems with teeth and/or dentures?: No Does patient usually wear dentures?: No  CIWA:  CIWA-Ar Total: 8 COWS:  COWS Total Score: 2  Treatment Plan Summary: Daily contact with patient to assess and evaluate symptoms and progress in treatment Medication management  Plan: Supportive approach/coping skills/relapse prevention           Pursue detox           Reassess and address the co morbities  Medical Decision Making Problem Points:  Review of last therapy session (1) and Review of psycho-social stressors (1) Data Points:  Review of medication regiment & side effects (2)  I certify that inpatient services furnished can reasonably be expected to improve the patient's condition.   Kearstyn Avitia A 08/14/2014, 12:09 PM

## 2014-08-14 NOTE — Progress Notes (Signed)
D: Pt presents anxious this morning. Pt experiencing withdrawal symptoms on approach. Pt observed tremulous, restless, sweats, and chills. Pt reports little depression. No suicidal thoughts. No AVH. Fair sleep. Pt compliant with taking meds and attending groups. Pt has minimal interaction on the milieu and little engagement with others. A: Medications administered as ordered per MD. Verbal support given. Pt encouraged to attend groups as tolerated. 15 minute checks performed for safety.  R: [facility]. Pt receptive to treatment plan.

## 2014-08-14 NOTE — BHH Group Notes (Signed)
[place] LCSW Group Therapy 08/14/2014  1:15 PM Type of Therapy: Group Therapy Participation Level: Active  Participation Quality: Attentive, Sharing and Supportive  Affect: Anxious, tearful  Cognitive: Alert and Oriented  Insight: Developing/Improving and Engaged  Engagement in Therapy: Developing/Improving and Engaged  Modes of Intervention: Clarification, Confrontation, Discussion, Education, Exploration, Limit-setting, Orientation, Problem-solving, Rapport Building, Art therapist, Socialization and Support  Summary of Progress/Problems: The topic for group today was emotional regulation. This group focused on both positive and negative emotion identification and allowed group members to process ways to identify feelings, regulate negative emotions, and find healthy ways to manage internal/external emotions. Group members were asked to reflect on a time when their reaction to an emotion led to a negative outcome and explored how alternative responses using emotion regulation would have benefited them. Group members were also asked to discuss a time when emotion regulation was utilized when a negative emotion was experienced. Patient became tearful when discussing having to be away from his family for the holidays to go to residential treatment. Patient was able to identify the benefits of treatment. CSW's and other group member provided emotional support and encouragement.  Tilden Fossa, MSW, [place] Worker [facility] [phone]

## 2014-08-15 DIAGNOSIS — F4010 Social phobia, unspecified: Secondary | ICD-10-CM

## 2014-08-15 MED ORDER — HYDROXYZINE HCL 25 MG PO TABS
25.0000 mg | ORAL_TABLET | Freq: Four times a day (QID) | ORAL | Status: DC | PRN
Start: 2014-08-15 — End: 2014-08-16
  Administered 2014-08-15: 25 mg via ORAL

## 2014-08-15 MED ORDER — TRAZODONE HCL 150 MG PO TABS
75.0000 mg | ORAL_TABLET | Freq: Every evening | ORAL | Status: DC | PRN
  Administered 2014-08-15: 75 mg via ORAL
  Filled 2014-08-15: qty 1

## 2014-08-15 NOTE — BHH Group Notes (Signed)
[place] LCSW Group Therapy 08/15/2014 1:15 PM Type of Therapy: Group Therapy Participation Level: Active  Participation Quality: Attentive, Sharing and Supportive  Affect: Appropriate  Cognitive: Alert and Oriented  Insight: Developing/Improving and Engaged  Engagement in Therapy: Developing/Improving and Engaged  Modes of Intervention: Activity, Clarification, Confrontation, Discussion, Education, Exploration, Limit-setting, Orientation, Problem-solving, Rapport Building, Art therapist, Socialization and Support  Summary of Progress/Problems: Patient was attentive and engaged with speaker from [place]. Patient was attentive to speaker while they shared their story of dealing with mental health and overcoming it. Patient expressed interest in their programs and services and received information on their agency. Patient processed ways they can relate to the speaker.   Tilden Fossa, MSW, [place] Worker [facility] [phone]

## 2014-08-15 NOTE — Clinical Social Work Note (Signed)
Per Will at [facility], patient has been accepted for admission tomorrow 12/18 at 1 pm. Patient and MD informed.  Tilden Fossa, MSW, [place] Worker [facility] [phone]

## 2014-08-15 NOTE — Progress Notes (Signed)
Heather SW called and requested nurse to call Kathleen Argue at [facility] phone [phone].  Katharine Look asked patient's status, reviewed with Dr. Sabra Heck, and relayed message to [facility].  Heather informed that nurse talked to [facility].

## 2014-08-15 NOTE — Progress Notes (Signed)
Pt attended karaoke group this evening.  

## 2014-08-15 NOTE — BHH Group Notes (Signed)
The focus of this group is to educate the patient on the purpose and policies of crisis stabilization and provide a format to answer questions about their admission.  The group details unit policies and expectations of patients while admitted.  Patient attended 0900 nurse education orientation group this morning.  Patient actively participated, appropriate affect, alert, appropriate insight and engagement.  Today patient will work on 3 goals for discharge.  

## 2014-08-15 NOTE — Progress Notes (Signed)
D: Pt presents flat in affect and anxious in mood. Pt's affect brightens upon interaction. Pt is visible within the milieu. Pt observed with minimal interaction with others. Pt attended NA this evening. Pt presents with a CIWA of 6: Tremors (3), Anxiety(2), and Sweats (1). Pt verbalizes having hot and cold sweats as well. Pt remains focused on going to a residential program. Pt aware that it would more than likely be [place] versus going to [facility]. Pt is requesting to be discharged early on Friday so he could handle personal and financial matters at home before going to [place]. Pt verbalizes the strong desire to make sure that his family is okay before leaving them. Pt appears worried and anxious in regards to this matter. Pt encouraged to continue to voice this concern as his discharge becomes closer. Pt is currently negative for any SI/HI/AVH.  A: Writer administered scheduled and prn medications to pt, per MD orders. Continued support and availability as needed was extended to this pt. Staff continue to monitor pt with q58min checks.  R: No adverse drug reactions noted. Pt receptive to treatment. Pt remains safe at this time.

## 2014-08-15 NOTE — Plan of Care (Signed)
Problem: Consults Goal: Depression Patient Education See Patient Education Module for education specifics.  Outcome: Completed/Met Date Met:  08/15/14 Nurse discussed depression with patient.     

## 2014-08-15 NOTE — Progress Notes (Signed)
D:  Patient's self inventory sheet, patient slept good last night, sleep medication helpful.  Good appetite, low energy level, good concentration.  Rated depression 8, hopeless 6, anxiety 9.  Withdrawal tremors continues.   Denied SI.  Denied physical problems.  Goal today to work on discharge process for tomorrow.  Plans to talk to MD/SW.  Does have discharge plans.  No problems anticipated after discharge. A:  Medications administered per MD orders.  Emotional support and encouragement given patient. R:  Denied SI and HI, contracts for safety.  Denied A/V hallucinations.  Safety maintained with 15 minute checks.

## 2014-08-15 NOTE — Progress Notes (Signed)
D: Pt presents appropriate in affect and anxious in mood. Pt actively participates within the milieu. Pt is currently negative for any SI/HI/AVH. Pt presents with mild tremors, sweats, and anxiety this evening.  A: Writer administered scheduled and prn medications to pt, per MD orders. Continued support and availability as needed was extended to this pt. Staff continue to monitor pt with q41min checks.  R: No adverse drug reactions noted. Pt receptive to treatment. Pt remains safe at this time.

## 2014-08-15 NOTE — Progress Notes (Signed)
[facility] MD Progress Note  08/15/2014 4:32 PM Chad Mclaughlin  MRN:  [id number] Subjective:  Chad Mclaughlin continues to be detox with Ativan. He continues to endorse underlying anxiety. Feels that the Trazodone 100 mg last night was too much. Would like to use a reduce dose. He states he knows he needs to go to rehab as he is afraid he is going to lose his job, and states his wife has given him an Estate agent that if he does not take care of his alcoholism she is living Diagnosis:   DSM5: Substance/Addictive Disorders:  Alcohol Related Disorder - Severe (303.90) Depressive Disorders:  Major Depressive Disorder - Mild (296.21) Total Time spent with patient: 30 minutes  Axis I: Generalized Anxiety Disorder and Social Anxiety  ADL's:  Intact  Sleep: Good  Appetite:  Fair Psychiatric Specialty Exam: Physical Exam  Review of Systems  Constitutional: Negative.   HENT: Negative.   Eyes: Negative.   Respiratory: Negative.   Cardiovascular: Negative.   Gastrointestinal: Negative.   Genitourinary: Negative.   Musculoskeletal: Negative.   Skin: Negative.   Neurological: Negative.   Endo/Heme/Allergies: Negative.   Psychiatric/Behavioral: Positive for substance abuse. The patient is nervous/anxious.     Blood pressure 129/78, pulse 84, temperature 98.2 F (36.8 C), temperature source Oral, resp. rate 16, height 5\' 2"  (1.575 m), weight 63.504 kg (140 lb).Body mass index is 25.6 kg/(m^2).  General Appearance: Fairly Groomed  Engineer, water::  Fair  Speech:  Clear and Coherent  Volume:  Normal  Mood:  Anxious and worried  Affect:  anxious worried  Thought Process:  Coherent and Goal Directed  Orientation:  Full (Time, Place, and Person)  Thought Content:  symptoms events worries concerns willingness to go to rehab  Suicidal Thoughts:  No  Homicidal Thoughts:  No  Memory:  Immediate;   Fair Recent;   Fair Remote;   Fair  Judgement:  Fair  Insight:  Present  Psychomotor Activity:  Restlessness   Concentration:  Fair  Recall:  [employer] of Knowledge:NA  Language: Fair  Akathisia:  No  Handed:    AIMS (if indicated):     Assets:  Desire for Improvement Social Support Vocational/Educational  Sleep:  Number of Hours: 6.75   Musculoskeletal: Strength & Muscle Tone: within normal limits Gait & Station: normal Patient leans: [name]  Current Medications: Current Facility-Administered Medications  Medication Dose Route Frequency Provider Last Rate Last Dose  . acetaminophen (TYLENOL) tablet 650 mg  650 mg Oral Q6H PRN Laverle Hobby, PA-C      . alum & mag hydroxide-simeth (MAALOX/MYLANTA) 200-200-20 MG/5ML suspension 30 mL  30 mL Oral Q4H PRN Laverle Hobby, PA-C      . hydrOXYzine (ATARAX/VISTARIL) tablet 25 mg  25 mg Oral Q6H PRN Laverle Hobby, PA-C   25 mg at 08/14/14 2139  . lamoTRIgine (LAMICTAL) tablet 50 mg  50 mg Oral BID Nicholaus Bloom, MD   50 mg at 08/15/14 2263  . loperamide (IMODIUM) capsule 2-4 mg  2-4 mg Oral PRN Laverle Hobby, PA-C      . LORazepam (ATIVAN) tablet 1 mg  1 mg Oral Q6H PRN Nicholaus Bloom, MD      . LORazepam (ATIVAN) tablet 1 mg  1 mg Oral TID Nicholaus Bloom, MD   1 mg at 08/15/14 1200   Followed by  . [START ON 08/16/2014] LORazepam (ATIVAN) tablet 1 mg  1 mg Oral BID Nicholaus Bloom, MD  Followed by  . [START ON 08/17/2014] LORazepam (ATIVAN) tablet 1 mg  1 mg Oral Daily Nicholaus Bloom, MD      . magnesium hydroxide (MILK OF MAGNESIA) suspension 30 mL  30 mL Oral Daily PRN Laverle Hobby, PA-C      . multivitamin with minerals tablet 1 tablet  1 tablet Oral Daily Laverle Hobby, PA-C   1 tablet at 08/15/14 [phone]  . ondansetron (ZOFRAN-ODT) disintegrating tablet 4 mg  4 mg Oral Q6H PRN Laverle Hobby, PA-C      . PARoxetine (PAXIL) tablet 40 mg  40 mg Oral Daily Laverle Hobby, PA-C   40 mg at 08/15/14 5993  . thiamine (B-1) injection 100 mg  100 mg Intramuscular Once Laverle Hobby, PA-C   100 mg at 08/12/14 2319  . thiamine (VITAMIN B-1)  tablet 100 mg  100 mg Oral Daily Laverle Hobby, PA-C   100 mg at 08/15/14 5701  . traZODone (DESYREL) tablet 75 mg  75 mg Oral QHS PRN Nicholaus Bloom, MD        Lab Results: No results found for this or any previous visit (from the past 48 hour(s)).  Physical Findings: AIMS: Facial and Oral Movements Muscles of Facial Expression: None, normal Lips and Perioral Area: None, normal Jaw: None, normal Tongue: None, normal,Extremity Movements Upper (arms, wrists, hands, fingers): None, normal Lower (legs, knees, ankles, toes): None, normal, Trunk Movements Neck, shoulders, hips: None, normal, Overall Severity Severity of abnormal movements (highest score from questions above): None, normal Incapacitation due to abnormal movements: None, normal Patient's awareness of abnormal movements (rate only patient's report): No Awareness, Dental Status Current problems with teeth and/or dentures?: No Does patient usually wear dentures?: No  CIWA:  CIWA-Ar Total: 1 COWS:  COWS Total Score: 2  Treatment Plan Summary: Daily contact with patient to assess and evaluate symptoms and progress in treatment Medication management  Plan: Supportive approach/coping skills/relapse prevention           Facilitate admission to a residential treatment program Medical Decision Making Problem Points:  Review of psycho-social stressors (1) Data Points:  Review of medication regiment & side effects (2) Review of new medications or change in dosage (2)  I certify that inpatient services furnished can reasonably be expected to improve the patient's condition.   Bailie Christenbury A 08/15/2014, 4:32 PM

## 2014-08-16 ENCOUNTER — Other Ambulatory Visit (HOSPITAL_COMMUNITY): Payer: Commercial Managed Care - PPO

## 2014-08-16 DIAGNOSIS — F10230 Alcohol dependence with withdrawal, uncomplicated: Secondary | ICD-10-CM | POA: Insufficient documentation

## 2014-08-16 DIAGNOSIS — F1994 Other psychoactive substance use, unspecified with psychoactive substance-induced mood disorder: Secondary | ICD-10-CM

## 2014-08-16 DIAGNOSIS — F1020 Alcohol dependence, uncomplicated: Secondary | ICD-10-CM

## 2014-08-16 MED ORDER — HYDROXYZINE HCL 25 MG PO TABS: 25.0000 mg | ORAL_TABLET | Freq: Four times a day (QID) | ORAL | Status: DC | PRN

## 2014-08-16 MED ORDER — LAMOTRIGINE 25 MG PO TABS: 50.0000 mg | ORAL_TABLET | Freq: Two times a day (BID) | ORAL | Status: DC

## 2014-08-16 MED ORDER — ADULT MULTIVITAMIN W/MINERALS CH: 1.0000 | ORAL_TABLET | Freq: Every day | ORAL | Status: DC

## 2014-08-16 MED ORDER — PAROXETINE HCL 40 MG PO TABS: 40.0000 mg | ORAL_TABLET | Freq: Every day | ORAL | Status: DC

## 2014-08-16 MED ORDER — TRAZODONE 25 MG HALF TABLET: 75.0000 mg | ORAL_TABLET | Freq: Every evening | ORAL | Status: DC | PRN

## 2014-08-16 NOTE — Progress Notes (Signed)
[facility] Adult Case Management Discharge Plan :  Will you be returning to the same living situation after discharge: No. pt accepted to [facility] [place] At discharge, do you have transportation home?:Yes,  wife coming at 11am to transport pt to facility Do you have the ability to pay for your medications:Yes,  Private insurance  Release of information consent forms completed and submitted to medical records by CSW.   Patient to Follow up at: Follow-up Information    Follow up with [facility] On 08/16/2014.   Why:  Arrive at facility by 1:00PM for assessment/admission. (per Gwyndolyn Saxon). You will be discharged at approximately 11:00AM and your wife will transport you to facility.    Contact information:   [address], [place] [address] Phone: [phone] Fax: [phone]      Patient denies SI/HI:   Yes,  during group/self report    Safety Planning and Suicide Prevention discussed:  Yes,  SPE completed with pt's mother. SPI pamphlet provided to pt and he was encouraged to share information with support network, ask questions, and talk about any concerns regarding SPI.  [name] patient is not a smoker  Smart, Tniyah Nakagawa [place] 08/16/2014, 8:17 AM
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
## 2014-08-16 NOTE — BHH Group Notes (Signed)
   [facility] LCSW Aftercare Discharge Planning Group Note  08/16/2014  8:45 AM   Participation Quality: Alert, Appropriate and Oriented  Mood/Affect: Anxious  Depression Rating: 3/4  Anxiety Rating:  8  Thoughts of Suicide: Pt denies SI/HI  Will you contract for safety? Yes  Current AVH: Pt denies  Plan for Discharge/Comments: Pt attended discharge planning group and actively participated in group. CSW provided pt with today's workbook. Patient reports having mixed feelings regarding discharging to residential treatment today but feels ready for discharge today.  He will discharge to [facility] for continued treatment.  Transportation Means: Pt reports access to transportation  Supports: Patient's family has been identified as supportive.  Tilden Fossa, MSW, [place] Worker [facility] [phone]

## 2014-08-16 NOTE — Tx Team (Signed)
Interdisciplinary Treatment Plan Update (Adult) Date: 08/16/2014   Time Reviewed: 9:30 AM  Progress in Treatment: Attending groups: Yes Participating in groups: Yes Taking medication as prescribed: Yes Tolerating medication: Yes Family/Significant other contact made: Yes, CSW has been in contact with mother and wife Patient understands diagnosis: Yes Discussing patient identified problems/goals with staff: Yes Medical problems stabilized or resolved: Yes Denies suicidal/homicidal ideation: Yes Issues/concerns per patient self-inventory: Yes Other:  New problem(s) identified: [name]  Discharge Plan or Barriers: Patient plans to discharge to [facility] for residential treatment today 08/16/14.  Reason for Continuation of Hospitalization:  Depression Anxiety Medication Stabilization   Comments: [name]  Estimated length of stay: Discharge anticipated for today 08/16/14.  For review of initial/current patient goals, please see plan of care. Pt is 33 year old male living in [place], [place] ([place]) with his wife and 3 dogs. Pt presents to [facility] voluntarily due to SI with plan, ETOH detox, depression/mood instability, and for medication stabilization. Pt has recent admission to [facility] (06/22/14-06/25/14) for ETOH detox. Pt reports that he relapsed shortly after discharge and was admitted to [facility] a few weeks ago, but was sent home. Pt currently denies SI/HI/AVH. He reports moderate withdrawals and is currently on Librium taper. Pt tearful and observably anxious. Recommendations for pt include: crisis stabilization, therapeutic milieu, encourage group attendance and participation, librium taper for withdrawals, medication management for mood stabilization, and development of comprehensive mental wellness/sobriety plan. Pt unsure if he wants to go directly into inpatient treatment. Pt requesting to return home at d/c and continue with CDIOP at the [facility]. CSW providing pt with  information to [facility] and [facility]. Pt encouraged to share info with his family and is aware of the difficulty in getting into inpatient treatment once he discharges from the hospital if he chooses to wait.   Attendees: Patient:    Family:    Physician: Dr. Parke Poisson; Dr. Sabra Heck 08/16/2014 9:30 AM  Nursing: Maureen Chatters, Kerby Nora , RN 08/16/2014 9:30 AM  Clinical Social Worker: Tilden Fossa, LCSWA 08/16/2014 9:30 AM  Other: Joette Catching, LCSW 08/16/2014 9:30 AM  Other: Lucinda Dell, Beverly Sessions Liaison 08/16/2014 9:30 AM  Other: Lars Pinks, Case Manager 08/16/2014 9:30 AM  Other:      Other:        Scribe for Treatment Team:  Tilden Fossa, MSW, [place] [phone]

## 2014-08-16 NOTE — Progress Notes (Signed)
Pt discharged to lobby, where wife is to pick him up. AVS reviewed, prescriptions given, and belongings returned. Pt says he feels safe and denies SI.

## 2014-08-16 NOTE — BHH Suicide Risk Assessment (Signed)
Suicide Risk Assessment  Discharge Assessment     Demographic Factors:  Male and Caucasian  Total Time spent with patient: 30 minutes  Psychiatric Specialty Exam:     Blood pressure 97/69, pulse 112, temperature 98 F (36.7 C), temperature source Oral, resp. rate 16, height 5\' 2"  (1.575 m), weight 63.504 kg (140 lb).Body mass index is 25.6 kg/(m^2).  General Appearance: Fairly Groomed  Engineer, water::  Fair  Speech:  Clear and Coherent  Volume:  Normal  Mood:  Anxious and worried  Affect:  Appropriate  Thought Process:  Coherent and Goal Directed  Orientation:  Full (Time, Place, and Person)  Thought Content:  plans as he moves on, relapse prevention plan  Suicidal Thoughts:  No  Homicidal Thoughts:  No  Memory:  Immediate;   Fair Recent;   Fair Remote;   Fair  Judgement:  Fair  Insight:  Present  Psychomotor Activity:  Normal  Concentration:  Fair  Recall:  [place] of Knowledge:Good, Vocabulary, current events  Language: Good  Akathisia:  No  Handed:  Left  AIMS (if indicated):     Assets:  Communication Skills Desire for [place] Talents/Skills Transportation Vocational/Educational  Sleep:  Number of Hours: 5    Musculoskeletal: Strength & Muscle Tone: within normal limits Gait & Station: normal Patient leans: [name]   Mental Status Per Nursing Assessment::   On Admission:     Current Mental Status by Physician: In full contact with reality. There are no active SI plans or intent. No active S/S of withdrawal He is willing and motivated to pursue further residential treatment    Loss Factors: NA  Historical Factors: NA  Risk Reduction Factors:   Sense of responsibility to family, Employed, Living with another person, especially a relative and Positive social support  Continued Clinical Symptoms:  Severe Anxiety and/or Agitation Alcohol/Substance Abuse/Dependencies  Cognitive Features That Contribute To Risk:   Closed-mindedness Polarized thinking Thought constriction (tunnel vision)    Suicide Risk:  Minimal: No identifiable suicidal ideation.  Patients presenting with no risk factors but with morbid ruminations; may be classified as minimal risk based on the severity of the depressive symptoms  Discharge Diagnoses:   AXIS I:  Alcohol Dependence, S/P alcohol withdrawal, GAD, Substance Induced Mood Disorder AXIS II:  No diagnosis AXIS III:   Past Medical History  Diagnosis Date  . Anxiety   . Bipolar affective disorder   . Alcohol abuse   . Depression    AXIS IV:  other psychosocial or environmental problems AXIS V:  61-70 mild symptoms  Plan Of Care/Follow-up recommendations:  Activity:  as tolerated Diet:  regular Follow up: [facility] Is patient on multiple antipsychotic therapies at discharge:  No   Has Patient had three or more failed trials of antipsychotic monotherapy by history:  No  Recommended Plan for Multiple Antipsychotic Therapies: NA    Fin Hupp A 08/16/2014, 10:01 AM

## 2014-08-16 NOTE — Progress Notes (Signed)
Recreation Therapy Notes  Animal-Assisted Activity/Therapy (AAA/T) Program Checklist/Progress Notes Patient Eligibility Criteria Checklist & Daily Group note for Rec Tx Intervention  Date: 12.17.2015 Time: 2:45pm Location: [address]   AAA/T Program Assumption of Risk Form signed by Patient/ or Parent Legal Guardian yes  Patient is free of allergies or sever asthma yes  Patient reports no fear of animals yes  Patient reports no history of cruelty to animals yes  Patient understands his/her participation is voluntary yes  Patient washes hands before animal contact yes  Patient washes hands after animal contact yes  Behavioral Response: Appropriate   Education: Hand Washing, Appropriate Animal Interaction   Education Outcome: Acknowledges education.   Clinical Observations/Feedback: Patient actively engaged with therapy dog, petting him appropriately and feeding him a treat in exchange for completion of basic obedience commands.   Chad Mclaughlin, LRT/CTRS        Sanjeev Main L 08/16/2014 10:11 AM

## 2014-08-16 NOTE — Progress Notes (Signed)
Pt is pleasant and cooperative. Denies SI, HI, a/v disturbances, and pain. No needs verbalized. Medications given as ordered. Safety maintained. Pt now contacting wife to pick up for discharge.

## 2014-08-19 ENCOUNTER — Other Ambulatory Visit (HOSPITAL_COMMUNITY): Payer: Commercial Managed Care - PPO

## 2014-08-20 NOTE — Progress Notes (Signed)
Patient Discharge Instructions:  After Visit Summary (AVS):   Faxed to:  08/20/14 Discharge Summary Note:   Faxed to:  08/20/14 Psychiatric Admission Assessment Note:   Faxed to:  08/20/14 Suicide Risk Assessment - Discharge Assessment:   Faxed to:  08/20/14 Faxed/Sent to the Next Level Care provider:  08/20/14 Faxed to [facility] @ [phone]  Patsey Berthold, 08/20/2014, 3:31 PM

## 2014-08-21 ENCOUNTER — Other Ambulatory Visit (HOSPITAL_COMMUNITY): Payer: Commercial Managed Care - PPO

## 2014-08-26 ENCOUNTER — Other Ambulatory Visit (HOSPITAL_COMMUNITY): Payer: Commercial Managed Care - PPO

## 2014-08-28 ENCOUNTER — Ambulatory Visit
Admission: RE | Admit: 2014-08-28 | Discharge: 2014-08-28 | Disposition: A | Discharge status: Home / Self Care | Payer: No Typology Code available for payment source | Source: Ambulatory Visit | Attending: *Deleted | Admitting: *Deleted

## 2014-08-28 ENCOUNTER — Other Ambulatory Visit: Payer: Self-pay | Admitting: *Deleted

## 2014-08-28 ENCOUNTER — Other Ambulatory Visit (HOSPITAL_COMMUNITY): Payer: Commercial Managed Care - PPO

## 2014-08-28 DIAGNOSIS — R7611 Nonspecific reaction to tuberculin skin test without active tuberculosis: Secondary | ICD-10-CM

## 2014-08-29 ENCOUNTER — Ambulatory Visit
Admission: RE | Admit: 2014-08-29 | Discharge: 2014-08-29 | Disposition: A | Discharge status: Home / Self Care | Payer: No Typology Code available for payment source | Source: Ambulatory Visit | Attending: *Deleted | Admitting: *Deleted

## 2014-08-29 ENCOUNTER — Other Ambulatory Visit: Payer: Self-pay | Admitting: *Deleted

## 2014-08-29 DIAGNOSIS — R7611 Nonspecific reaction to tuberculin skin test without active tuberculosis: Secondary | ICD-10-CM

## 2014-09-02 ENCOUNTER — Other Ambulatory Visit (HOSPITAL_COMMUNITY): Payer: Commercial Managed Care - PPO

## 2014-09-04 ENCOUNTER — Other Ambulatory Visit (HOSPITAL_COMMUNITY): Payer: Commercial Managed Care - PPO

## 2014-09-06 ENCOUNTER — Other Ambulatory Visit (HOSPITAL_COMMUNITY): Payer: Commercial Managed Care - PPO

## 2014-09-09 ENCOUNTER — Other Ambulatory Visit (HOSPITAL_COMMUNITY): Payer: Commercial Managed Care - PPO

## 2014-09-11 ENCOUNTER — Other Ambulatory Visit (HOSPITAL_COMMUNITY): Payer: Commercial Managed Care - PPO

## 2014-09-13 ENCOUNTER — Other Ambulatory Visit (HOSPITAL_COMMUNITY): Payer: Commercial Managed Care - PPO

## 2014-09-16 ENCOUNTER — Other Ambulatory Visit (HOSPITAL_COMMUNITY): Payer: Commercial Managed Care - PPO

## 2014-09-18 ENCOUNTER — Other Ambulatory Visit (HOSPITAL_COMMUNITY): Payer: Commercial Managed Care - PPO

## 2014-09-18 ENCOUNTER — Emergency Department (HOSPITAL_COMMUNITY)
Admission: EM | Admit: 2014-09-18 | Discharge: 2014-09-19 | Disposition: A | Discharge status: Home / Self Care | Payer: Commercial Managed Care - PPO | Attending: Emergency Medicine | Admitting: Emergency Medicine

## 2014-09-18 ENCOUNTER — Encounter (HOSPITAL_COMMUNITY): Payer: Self-pay | Admitting: *Deleted

## 2014-09-18 DIAGNOSIS — F1010 Alcohol abuse, uncomplicated: Secondary | ICD-10-CM | POA: Insufficient documentation

## 2014-09-18 DIAGNOSIS — F319 Bipolar disorder, unspecified: Secondary | ICD-10-CM | POA: Diagnosis not present

## 2014-09-18 DIAGNOSIS — F419 Anxiety disorder, unspecified: Secondary | ICD-10-CM | POA: Insufficient documentation

## 2014-09-18 DIAGNOSIS — Z79899 Other long term (current) drug therapy: Secondary | ICD-10-CM | POA: Insufficient documentation

## 2014-09-18 LAB — CBC
HEMATOCRIT: 46.1 % (ref 39.0–52.0)
Hemoglobin: 16.5 g/dL (ref 13.0–17.0)
MCH: 31.4 pg (ref 26.0–34.0)
MCHC: 35.8 g/dL (ref 30.0–36.0)
MCV: 87.6 fL (ref 78.0–100.0)
Platelets: 271 10*3/uL (ref 150–400)
RBC: 5.26 MIL/uL (ref 4.22–5.81)
RDW: 12 % (ref 11.5–15.5)
WBC: 7.2 10*3/uL (ref 4.0–10.5)

## 2014-09-18 LAB — COMPREHENSIVE METABOLIC PANEL
ALT: 49 U/L (ref 0–53)
AST: 40 U/L — ABNORMAL HIGH (ref 0–37)
Albumin: 4.4 g/dL (ref 3.5–5.2)
Alkaline Phosphatase: 51 U/L (ref 39–117)
Anion gap: 14 (ref 5–15)
BUN: 12 mg/dL (ref 6–23)
CO2: 27 mmol/L (ref 19–32)
Calcium: 8.4 mg/dL (ref 8.4–10.5)
Chloride: 102 mEq/L (ref 96–112)
Creatinine, Ser: 1.01 mg/dL (ref 0.50–1.35)
GFR calc non Af Amer: >90 mL/min (ref >90)
Glucose, Bld: 86 mg/dL (ref 70–99)
POTASSIUM: 3.6 mmol/L (ref 3.5–5.1)
Sodium: 143 mmol/L (ref 135–145)
Total Bilirubin: 1 mg/dL (ref 0.3–1.2)
Total Protein: 7 g/dL (ref 6.0–8.3)

## 2014-09-18 LAB — URINE MICROSCOPIC-ADD ON

## 2014-09-18 LAB — URINALYSIS, ROUTINE W REFLEX MICROSCOPIC
BILIRUBIN URINE: NEGATIVE
Glucose, UA: NEGATIVE mg/dL
Hgb urine dipstick: NEGATIVE
Ketones, ur: 15 mg/dL — AB
LEUKOCYTES UA: NEGATIVE
Nitrite: NEGATIVE
PH: 7 (ref 5.0–8.0)
Protein, ur: 30 mg/dL — AB
Specific Gravity, Urine: 1.025 (ref 1.005–1.030)
Urobilinogen, UA: 1 mg/dL (ref 0.0–1.0)

## 2014-09-18 LAB — RAPID URINE DRUG SCREEN, HOSP PERFORMED
AMPHETAMINES: NOT DETECTED
BENZODIAZEPINES: NOT DETECTED
Barbiturates: NOT DETECTED
Cocaine: NOT DETECTED
OPIATES: NOT DETECTED
TETRAHYDROCANNABINOL: NOT DETECTED

## 2014-09-18 LAB — ACETAMINOPHEN LEVEL

## 2014-09-18 LAB — ETHANOL: ALCOHOL ETHYL (B): 399 mg/dL — AB (ref 0–9)

## 2014-09-18 LAB — SALICYLATE LEVEL

## 2014-09-18 MED ORDER — LORAZEPAM 1 MG PO TABS
0.0000 mg | ORAL_TABLET | Freq: Two times a day (BID) | ORAL | Status: DC
Start: 2014-09-18 — End: 2014-09-19

## 2014-09-18 MED ORDER — ONDANSETRON HCL 4 MG PO TABS: 4.0000 mg | ORAL_TABLET | Freq: Three times a day (TID) | ORAL | Status: DC | PRN

## 2014-09-18 MED ORDER — VITAMIN B-1 100 MG PO TABS
100.0000 mg | ORAL_TABLET | Freq: Every day | ORAL | Status: DC
  Administered 2014-09-18: 100 mg via ORAL
  Filled 2014-09-18: qty 1

## 2014-09-18 MED ORDER — THIAMINE HCL 100 MG/ML IJ SOLN: 100.0000 mg | Freq: Every day | INTRAMUSCULAR | Status: DC

## 2014-09-18 MED ORDER — LAMOTRIGINE 100 MG PO TABS
50.0000 mg | ORAL_TABLET | Freq: Two times a day (BID) | ORAL | Status: DC
  Administered 2014-09-18: 50 mg via ORAL
  Filled 2014-09-18: qty 1

## 2014-09-18 MED ORDER — LORAZEPAM 1 MG PO TABS: 0.0000 mg | ORAL_TABLET | Freq: Four times a day (QID) | ORAL | Status: DC

## 2014-09-18 MED ORDER — LORAZEPAM 1 MG PO TABS
1.0000 mg | ORAL_TABLET | Freq: Once | ORAL | Status: AC
  Administered 2014-09-18: 1 mg via ORAL
  Filled 2014-09-18: qty 1

## 2014-09-18 MED ORDER — HYDROXYZINE HCL 25 MG PO TABS: 25.0000 mg | ORAL_TABLET | Freq: Four times a day (QID) | ORAL | Status: DC | PRN

## 2014-09-18 MED ORDER — LORAZEPAM 2 MG/ML IJ SOLN: 0.0000 mg | Freq: Four times a day (QID) | INTRAMUSCULAR | Status: DC

## 2014-09-18 MED ORDER — LORAZEPAM 2 MG/ML IJ SOLN: 0.0000 mg | Freq: Two times a day (BID) | INTRAMUSCULAR | Status: DC

## 2014-09-18 MED ORDER — TRAZODONE HCL 50 MG PO TABS: 75.0000 mg | ORAL_TABLET | Freq: Every evening | ORAL | Status: DC | PRN

## 2014-09-18 MED ORDER — PAROXETINE HCL 20 MG PO TABS
40.0000 mg | ORAL_TABLET | Freq: Every day | ORAL | Status: DC
  Administered 2014-09-18: 40 mg via ORAL
  Filled 2014-09-18: qty 2

## 2014-09-18 NOTE — ED Provider Notes (Signed)
CSN: [id number]     Arrival date & time 09/18/14  1918 History   First MD Initiated Contact with Patient 09/18/14 1933     Chief Complaint  Patient presents with  . detox    Chad Mclaughlin is a 34 y.o. male with a history of alcohol abuse, anxiety, depression and bipolar disorder who presents to the emergency department requesting help with his drinking problem. The patient was discharged from [facility] for alcohol abuse 6 days ago. The patient reports starting drinking again 3 days ago. He is unsure about how much she has to drink today but believes he drank at least a fifth of vodka and some beer. Patient reports he has not been taking his medications for the last 3 days. The patient denies seizures from alcohol withdrawal previously. The patient reports a visual hallucination earlier of writing on a wall but denies any current visual or auditory hallucinations. The patient denies suicidal or homicidal ideations. The patient has no complaints currently. The patient denies fevers, chills, nausea, vomiting, abdominal pain, headache, numbness, tingling or weakness.  (Consider location/radiation/quality/duration/timing/severity/associated sxs/prior Treatment) HPI  Past Medical History  Diagnosis Date  . Anxiety   . Bipolar affective disorder   . Alcohol abuse   . Depression    History reviewed. No pertinent past surgical history. No family history on file. History  Substance Use Topics  . Smoking status: Never Smoker   . Smokeless tobacco: Not on file  . Alcohol Use: 7.2 oz/week    12 Cans of beer per week     Comment: daily     Review of Systems  Constitutional: Negative for fever and chills.  HENT: Negative for congestion and sore throat.   Eyes: Negative for visual disturbance.  Respiratory: Negative for cough, shortness of breath and wheezing.   Cardiovascular: Negative for chest pain and palpitations.  Gastrointestinal: Negative for nausea, vomiting, abdominal pain and  diarrhea.  Genitourinary: Negative for dysuria.  Musculoskeletal: Negative for back pain and neck pain.  Skin: Negative for rash.  Neurological: Negative for weakness, numbness and headaches.  Psychiatric/Behavioral: Negative for suicidal ideas.      Allergies  Shellfish allergy  Home Medications   Prior to Admission medications   Medication Sig Start Date End Date Taking? Authorizing Provider  hydrOXYzine (ATARAX/VISTARIL) 25 MG tablet Take 1 tablet (25 mg total) by mouth every 6 (six) hours as needed for anxiety. 08/16/14   Encarnacion Slates, NP  lamoTRIgine (LAMICTAL) 25 MG tablet Take 2 tablets (50 mg total) by mouth 2 (two) times daily. For mood stabilization 08/16/14   Encarnacion Slates, NP  Multiple Vitamin (MULTIVITAMIN WITH MINERALS) TABS tablet Take 1 tablet by mouth daily. May purchase from over the counter at yr local pharmacy:  Vitamin supplement 08/16/14   Encarnacion Slates, NP  PARoxetine (PAXIL) 40 MG tablet Take 1 tablet (40 mg total) by mouth daily. For depression 08/16/14   Encarnacion Slates, NP  traZODone (DESYREL) 25 mg TABS tablet Take 1.5 tablets (75 mg total) by mouth at bedtime as needed for sleep. 08/16/14   Encarnacion Slates, NP   BP 110/63 mmHg  Pulse 119  Temp(Src) 98.5 F (36.9 C) (Oral)  Resp 16  Ht 5\' 5"  (1.651 m)  Wt 140 lb (63.504 kg)  BMI 23.30 kg/m2  SpO2 96% Physical Exam  Constitutional: He is oriented to person, place, and time. He appears well-developed and well-nourished. No distress.  Nontoxic appearing. Patient smells of alcohol.  HENT:  Head: Normocephalic and atraumatic.  Mouth/Throat: Oropharynx is clear and moist. No oropharyngeal exudate.  Eyes: Conjunctivae and EOM are normal. Pupils are equal, round, and reactive to light. Right eye exhibits no discharge. Left eye exhibits no discharge.  Neck: Neck supple.  Cardiovascular: Normal rate, regular rhythm, normal heart sounds and intact distal pulses.  Exam reveals no gallop and no friction rub.   No  murmur heard. Pulmonary/Chest: Effort normal and breath sounds normal. No respiratory distress. He has no wheezes. He has no rales.  Abdominal: Soft. Bowel sounds are normal. He exhibits no distension and no mass. There is no tenderness. There is no rebound and no guarding.  Musculoskeletal: He exhibits no edema.  Lymphadenopathy:    He has no cervical adenopathy.  Neurological: He is alert and oriented to person, place, and time. Coordination normal.  Patient is alert and oriented 3.  Skin: Skin is warm and dry. No rash noted. He is not diaphoretic. No erythema. No pallor.  Psychiatric: He has a normal mood and affect. His behavior is normal. His mood appears not anxious. His speech is not slurred. He is not agitated and not aggressive. Thought content is not paranoid. He does not exhibit a depressed mood. He expresses no homicidal and no suicidal ideation.  Patient denies suicidal or homicidal ideations.  Nursing note and vitals reviewed.   ED Course  Procedures (including critical care time) Labs Review Labs Reviewed  ACETAMINOPHEN LEVEL - Abnormal; Notable for the following:    Acetaminophen (Tylenol), Serum <10.0 (*)    All other components within normal limits  COMPREHENSIVE METABOLIC PANEL - Abnormal; Notable for the following:    AST 40 (*)    All other components within normal limits  ETHANOL - Abnormal; Notable for the following:    Alcohol, Ethyl (B) 399 (*)    All other components within normal limits  URINALYSIS, ROUTINE W REFLEX MICROSCOPIC - Abnormal; Notable for the following:    Ketones, ur 15 (*)    Protein, ur 30 (*)    All other components within normal limits  CBC  SALICYLATE LEVEL  URINE RAPID DRUG SCREEN (HOSP PERFORMED)  URINE MICROSCOPIC-ADD ON    Imaging Review No results found.   EKG Interpretation None      Filed Vitals:   09/18/14 1923 09/18/14 2000 09/18/14 2348  BP: 126/79 110/83 110/63  Pulse: 109 109 119  Temp: 97.9 F (36.6 C)  98.5  F (36.9 C)  TempSrc: Oral  Oral  Resp: 16 17 16   Height: 5\' 5"  (1.651 m)    Weight: 140 lb (63.504 kg)    SpO2: 99% 98% 96%     MDM   Meds given in ED:  Medications  LORazepam (ATIVAN) tablet 0-4 mg (not administered)  LORazepam (ATIVAN) tablet 0-4 mg (0 mg Oral Hold 09/18/14 2151)  LORazepam (ATIVAN) injection 0-4 mg (not administered)  LORazepam (ATIVAN) injection 0-4 mg (0 mg Intravenous Not Given 09/18/14 2150)  thiamine (VITAMIN B-1) tablet 100 mg (100 mg Oral Given 09/18/14 2249)  thiamine (B-1) injection 100 mg (100 mg Intravenous Not Given 09/18/14 2150)  ondansetron (ZOFRAN) tablet 4 mg (not administered)  hydrOXYzine (ATARAX/VISTARIL) tablet 25 mg (not administered)  lamoTRIgine (LAMICTAL) tablet 50 mg (50 mg Oral Given 09/18/14 2249)  PARoxetine (PAXIL) tablet 40 mg (40 mg Oral Given 09/18/14 2250)  traZODone (DESYREL) tablet 75 mg (not administered)  LORazepam (ATIVAN) tablet 1 mg (1 mg Oral Given 09/18/14 2130)    Discharge  Medication List as of 09/18/2014 11:29 PM      Final diagnoses:  Alcohol abuse   This is a 34 year old male with a history of alcohol abuse, depression, anxiety, and bipolar disorder who presented to the emergency department with his wife requesting help for alcohol abuse. Patient was just discharged from [facility] for alcohol treatment 6 days ago. Patient reports he started drinking again 3 days ago. Patient reports drinking a fifth of vodka and some beer today. The patient denies any complaints currently. The patient is afebrile and nontoxic-appearing. The patient denies suicidal or homicidal ideations. The patient denies current auditory or visual hallucinations. Patient has history of seizures are much all from alcohol. Patient's urine drug screen is negative. Urinalysis showed no signs of infection. Patient will level is 399. CBC and CMP are unremarkable. Behavioral health consult was performed and the suggested he stay to seek placement at  substance abuse center. The patient is unwilling to stay. The patient reports he will follow-up with [facility] and does not wish to seek other treatment. Due to the fact that the patient left after extensive work up and despite the recommendation from [facility] I would not suggest doing behavioral health assessment until he is sober. The patient does have good family support and he will be taken home by his wife. Information given on substance abuse treatment. Strict return precautions are provided. I advised the patient to follow-up with their primary care provider this week. I advised the patient to return to the emergency department with new or worsening symptoms or new concerns. The patient verbalized understanding and agreement with plan.   This patient was discussed with Dr. Leonides Schanz who agrees with assessment and plan.     Hanley Hays, PA-C 09/19/14 [place], DO 09/19/14 [id number]

## 2014-09-18 NOTE — ED Notes (Signed)
Attempted call to [facility]
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
## 2014-09-18 NOTE — ED Notes (Signed)
The pt just got out of [facility] Monday for alcoholism.  Since then he has been drinking alcohol again.  No drug use

## 2014-09-18 NOTE — Discharge Instructions (Signed)
Alcohol and Nutrition °Nutrition serves two purposes. It provides energy. It also maintains body structure and function. Food supplies energy. It also provides the building blocks needed to replace worn or damaged cells. Alcoholics often eat poorly. This limits their supply of essential nutrients. This affects energy supply and structure maintenance. Alcohol also affects the body's nutrients in: °· Digestion. °· Storage. °· Using and getting rid of waste products. °IMPAIRMENT OF NUTRIENT DIGESTION AND UTILIZATION  °· Once ingested, food must be broken down into small components (digested). Then it is available for energy. It helps maintain body structure and function. Digestion begins in the mouth. It continues in the stomach and intestines, with help from the pancreas. The nutrients from digested food are absorbed from the intestines into the blood. Then they are carried to the liver. The liver prepares nutrients for: °· Immediate use. °· Storage and future use. °· Alcohol inhibits the breakdown of nutrients into usable molecules. °· It decreases secretion of digestive enzymes from the pancreas. °· Alcohol impairs nutrient absorption by damaging the cells lining the stomach and intestines. °· It also interferes with moving some nutrients into the blood. °· In addition, nutritional deficiencies themselves may lead to further absorption problems. °· For example, folate deficiency changes the cells that line the small intestine. This impairs how water is absorbed. It also affects absorbed nutrients. These include glucose, sodium, and additional folate. °· Even if nutrients are digested and absorbed, alcohol can prevent them from being fully used. It changes their transport, storage, and excretion. Impaired utilization of nutrients by alcoholics is indicated by: °· Decreased liver stores of vitamins, such as vitamin A. °· Increased excretion of nutrients such as fat. °ALCOHOL AND ENERGY SUPPLY  °· Three basic  nutritional components found in food are: °· Carbohydrates. °· Proteins. °· Fats. °· These are used as energy. Some alcoholics take in as much as 50% of their total daily calories from alcohol. They often neglect important foods. °· Even when enough food is eaten, alcohol can impair the ways the body controls blood sugar (glucose) levels. It may either increase or decrease blood sugar. °· In non-diabetic alcoholics, increased blood sugar (hyperglycemia) is caused by poor insulin secretion. It is usually temporary. °· Decreased blood sugar (hypoglycemia) can cause serious injury even if this condition is short-lived. Low blood sugar can happen when a fasting or malnourished person drinks alcohol. When there is no food to supply energy, stored sugar is used up. The products of alcohol inhibit forming glucose from other compounds such as amino acids. As a result, alcohol causes the brain and other body tissue to lack glucose. It is needed for energy and function. °· Alcohol is an energy source. But how the body processes and uses the energy from alcohol is complex. Also, when alcohol is substituted for carbohydrates, subjects tend to lose weight. This indicates that they get less energy from alcohol than from food. °ALCOHOL - MAINTAINING CELL STRUCTURE AND FUNCTION  °Structure °Cells are made mostly of protein. So an adequate protein diet is important for maintaining cell structure. This is especially true if cells are being damaged. Research indicates that alcohol affects protein nutrition by causing impaired: °· Digestion of proteins to amino acids. °· Processing of amino acids by the small intestine and liver. °· Synthesis of proteins from amino acids. °· Protein secretion by the liver. °Function °Nutrients are essential for the body to function well. They provide the tools that the body needs to work well:  °·   Proteins.  Vitamins.  Minerals. Alcohol can disrupt body function. It may cause nutrient  deficiencies. And it may interfere with the way nutrients are processed. Vitamins  Vitamins are essential to maintain growth and normal metabolism. They regulate many of the body`s processes. Chronic heavy drinking causes deficiencies in many vitamins. This is caused by eating less. And, in some cases, vitamins may be poorly absorbed. For example, alcohol inhibits fat absorption. It impairs how the vitamins A, E, and D are normally absorbed along with dietary fats. Not enough vitamin A may cause night blindness. Not enough vitamin D may cause softening of the bones.  Some alcoholics lack vitamins A, C, D, E, K, and the B vitamins. These are all involved in wound healing and cell maintenance. In particular, because vitamin K is necessary for blood clotting, lacking that vitamin can cause delayed clotting. The result is excess bleeding. Lacking other vitamins involved in brain function may cause severe neurological damage. Minerals Deficiencies of minerals such as calcium, magnesium, iron, and zinc are common in alcoholics. The alcohol itself does not seem to affect how these minerals are absorbed. Rather, they seem to occur secondary to other alcohol-related problems, such as:  Less calcium absorbed.  Not enough magnesium.  More urinary excretion.  Vomiting.  Diarrhea.  Not enough iron due to gastrointestinal bleeding.  Not enough zinc or losses related to other nutrient deficiencies.  Mineral deficiencies can cause a variety of medical consequences. These range from calcium-related bone disease to zinc-related night blindness and skin lesions. ALCOHOL, MALNUTRITION, AND MEDICAL COMPLICATIONS  Liver Disease   Alcoholic liver damage is caused primarily by alcohol itself. But poor nutrition may increase the risk of alcohol-related liver damage. For example, nutrients normally found in the liver are known to be affected by drinking alcohol. These include carotenoids, which are the major  sources of vitamin A, and vitamin E compounds. Decreases in such nutrients may play some role in alcohol-related liver damage. Pancreatitis  Research suggests that malnutrition may increase the risk of developing alcoholic pancreatitis. Research suggests that a diet lacking in protein may increase alcohol's damaging effect on the pancreas. Brain  Nutritional deficiencies may have severe effects on brain function. These may be permanent. Specifically, thiamine deficiencies are often seen in alcoholics. They can cause severe neurological problems. These include:  Impaired movement.  Memory loss seen in Wernicke-Korsakoff syndrome. Pregnancy  Alcohol has toxic effects on fetal development. It causes alcohol-related birth defects. They include fetal alcohol syndrome. Alcohol itself is toxic to the fetus. Also, the nutritional deficiency can affect how the fetus develops. That may compound the risk of developmental damage.  Nutritional needs during pregnancy are 10% to 30% greater than normal. Food intake can increase by as much as 140% to cover the needs of both mother and fetus. An alcoholic mother`s nutritional problems may adversely affect the nutrition of the fetus. And alcohol itself can also restrict nutrition flow to the fetus. NUTRITIONAL STATUS OF ALCOHOLICS  Techniques for assessing nutritional status include:  Taking body measurements to estimate fat reserves. They include:  Weight.  Height.  Mass.  Skin fold thickness.  Performing blood analysis to provide measurements of circulating:  Proteins.  Vitamins.  Minerals.  These techniques tend to be imprecise. For many nutrients, there is no clear "cut-off" point that would allow an accurate definition of deficiency. So assessing the nutritional status of alcoholics is limited by these techniques. Dietary status may provide information about the risk of developing nutritional problems.  Dietary status is assessed by:  Taking  patients' dietary histories.  Evaluating the amount and types of food they are eating.  It is difficult to determine what exact amount of alcohol begins to have damaging effects on nutrition. In general, moderate drinkers have 2 drinks or less per day. They seem to be at little risk for nutritional problems. Various medical disorders begin to appear at greater levels.  Research indicates that the majority of even the heaviest drinkers have few obvious nutritional deficiencies. Many alcoholics who are hospitalized for medical complications of their disease do have severe malnutrition. Alcoholics tend to eat poorly. Often they eat less than the amounts of food necessary to provide enough:  Carbohydrates.  Protein.  Fat.  Vitamins A and C.  B vitamins.  Minerals like calcium and iron. Of major concern is alcohol's effect on digesting food and use of nutrients. It may shift a mildly malnourished person toward severe malnutrition. Document Released: 06/10/2005 Document Revised: 11/08/2011 Document Reviewed: 11/24/2005 [facility] Patient Information 2015 [place], [place]. This information is not intended to replace advice given to you by your health care provider. Make sure you discuss any questions you have with your health care provider.  Alcohol Withdrawal Anytime drug use is interfering with normal living activities it has become abuse. This includes problems with family and friends. Psychological dependence has developed when your mind tells you that the drug is needed. This is usually followed by physical dependence when a continuing increase of drugs are required to get the same feeling or "high." This is known as addiction or chemical dependency. A person's risk is much higher if there is a history of chemical dependency in the family. Mild Withdrawal Following Stopping Alcohol, When Addiction or Chemical Dependency Has Developed When a person has developed tolerance to alcohol, any sudden  stopping of alcohol can cause uncomfortable physical symptoms. Most of the time these are mild and consist of tremors in the hands and increases in heart rate, breathing, and temperature. Sometimes these symptoms are associated with anxiety, panic attacks, and bad dreams. There may also be stomach upset. Normal sleep patterns are often interrupted with periods of inability to sleep (insomnia). This may last for 6 months. Because of this discomfort, many people choose to continue drinking to get rid of this discomfort and to try to feel normal. Severe Withdrawal with Decreased or No Alcohol Intake, When Addiction or Chemical Dependency Has Developed About five percent of alcoholics will develop signs of severe withdrawal when they stop using alcohol. One sign of this is development of generalized seizures (convulsions). Other signs of this are severe agitation and confusion. This may be associated with believing in things which are not real or seeing things which are not really there (delusions and hallucinations). Vitamin deficiencies are usually present if alcohol intake has been long-term. Treatment for this most often requires hospitalization and close observation. Addiction can only be helped by stopping use of all chemicals. This is hard but may save your life. With continual alcohol use, possible outcomes are usually loss of self respect and esteem, violence, and death. Addiction cannot be cured but it can be stopped. This often requires outside help and the care of professionals. Treatment centers are listed in the yellow pages under Cocaine, Narcotics, and Alcoholics Anonymous. Most hospitals and clinics can refer you to a specialized care center. It is not necessary for you to go through the uncomfortable symptoms of withdrawal. Your caregiver can provide you with medicines that will  help you through this difficult period. Try to avoid situations, friends, or drugs that made it possible for you to keep  using alcohol in the past. Learn how to say no. It takes a long period of time to overcome addictions to all drugs, including alcohol. There may be many times when you feel as though you want a drink. After getting rid of the physical addiction and withdrawal, you will have a lessening of the craving which tells you that you need alcohol to feel normal. Call your caregiver if more support is needed. Learn who to talk to in your family and among your friends so that during these periods you can receive outside help. Alcoholics Anonymous (AA) has helped many people over the years. To get further help, contact AA or call your caregiver, counselor, or clergyperson. Al-Anon and Alateen are support groups for friends and family members of an alcoholic. The people who love and care for an alcoholic often need help, too. For information about these organizations, check your phone directory or call a local alcoholism treatment center.  SEEK IMMEDIATE MEDICAL CARE IF:   You have a seizure.  You have a fever.  You experience uncontrolled vomiting or you vomit up blood. This may be bright red or look like black coffee grounds.  You have blood in the stool. This may be bright red or appear as a black, tarry, bad-smelling stool.  You become lightheaded or faint. Do not drive if you feel this way. Have someone else drive you or call 161 for help.  You become more agitated or confused.  You develop uncontrolled anxiety.  You begin to see things that are not really there (hallucinate). Your caregiver has determined that you completely understand your medical condition, and that your mental state is back to normal. 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 Document Released: 05/26/2005 Document Revised:  11/08/2011 Document Reviewed: 04/03/2008 [facility] Patient Information 2015 [place], [place]. This information is not intended to replace advice given to you by your health care provider. Make sure you discuss any questions you have with your health care provider.  Alcohol Intoxication Alcohol intoxication occurs when the amount of alcohol that a person has consumed impairs his or her ability to mentally and physically function. Alcohol directly impairs the normal chemical activity of the brain. Drinking large amounts of alcohol can lead to changes in mental function and behavior, and it can cause many physical effects that can be harmful.  Alcohol intoxication can range in severity from mild to very severe. Various factors can affect the level of intoxication that occurs, such as the person's age, gender, weight, frequency of alcohol consumption, and the presence of other medical conditions (such as diabetes, seizures, or heart conditions). Dangerous levels of alcohol intoxication may occur when people drink large amounts of alcohol in a short period (binge drinking). Alcohol can also be especially dangerous when combined with certain prescription medicines or "recreational" drugs. SIGNS AND SYMPTOMS Some common signs and symptoms of mild alcohol intoxication include:  Loss of coordination.  Changes in mood and behavior.  Impaired judgment.  Slurred speech. As alcohol intoxication progresses to more severe levels, other signs and symptoms will appear. These may include:  Vomiting.  Confusion and impaired memory.  Slowed breathing.  Seizures.  Loss of consciousness. DIAGNOSIS  Your health care provider will take a medical history and perform a physical exam. You will be asked about the amount and type of alcohol you have consumed. Blood tests will be done to measure the concentration of alcohol in your blood. In many places, your blood alcohol level must be lower than 80 mg/dL (0.08%) to  legally drive. However, many dangerous effects of alcohol can occur at much lower levels.  TREATMENT  People with alcohol intoxication often do not require treatment. Most of the effects of alcohol intoxication are temporary, and they go away as the alcohol naturally leaves the body. Your health care provider will monitor your condition until you are stable enough to go home. Fluids are sometimes given through an IV access tube to help prevent dehydration.  HOME CARE INSTRUCTIONS  Do not drive after drinking alcohol.  Stay hydrated. Drink enough water and fluids to keep your urine clear or pale yellow. Avoid caffeine.   Only take over-the-counter or prescription medicines as directed by your health care provider.  SEEK MEDICAL CARE IF:   You have persistent vomiting.   You do not feel better after a few days.  You have frequent alcohol intoxication. Your health care provider can help determine if you should see a substance use treatment counselor. SEEK IMMEDIATE MEDICAL CARE IF:   You become shaky or tremble when you try to stop drinking.   You shake uncontrollably (seizure).   You throw up (vomit) blood. This may be bright red or may look like black coffee grounds.   You have blood in your stool. This may be bright red or may appear as a black, tarry, bad smelling stool.   You become lightheaded or faint.  MAKE SURE YOU:   Understand these instructions.  Will watch your condition.  Will get help right away if you are not doing well or get worse. Document Released: 05/26/2005 Document Revised: 04/18/2013 Document Reviewed: 01/19/2013 [facility] Patient Information 2015 [place], [place]. This information is not intended to replace advice given to you by your health care provider. Make sure you discuss any questions you have with your health care provider.  Emergency Department Resource Guide 1) Find a Doctor and Pay Out of Pocket Although you won't have to find out who is  covered by your insurance plan, it is a good idea to ask around and get recommendations. You will then need to call the office and see if the doctor you have chosen will accept you as a new patient and what types of options they offer for patients who are self-pay. Some doctors offer discounts or will set up payment plans for their patients who do not have insurance, but you will need to ask so you aren't surprised when you get to your appointment.  2) Contact Your Local Health Department Not all health departments have doctors that can see patients for sick visits, but many do, so it is worth a call to see if yours does. If you don't know where your local health department is, you can check in your phone book. The CDC also has a tool to help you locate your state's health department, and many state websites also have listings of all of their local health departments.  3) Find a [facility] If your illness is not likely to be very severe or complicated, you may want to try a walk in clinic. These are popping up all over the country in pharmacies, drugstores,  and shopping centers. They're usually staffed by nurse practitioners or physician assistants that have been trained to treat common illnesses and complaints. They're usually fairly quick and inexpensive. However, if you have serious medical issues or chronic medical problems, these are probably not your best option.  No Primary Care Doctor: - Call Health Connect at  [phone] - they can help you locate a primary care doctor that  accepts your insurance, provides certain services, etc. - Physician Referral Service- [phone]  Chronic Pain Problems: Organization         Address  Phone   Notes  [facility]  [phone] Patients need to be referred by their primary care doctor.   Medication Assistance: Organization         Address  Phone   Notes  [facility] Medication [facility] [address] [place], [place] [address] [phone] --Must be a resident of [facility] -- Must have NO insurance coverage whatsoever (no Medicaid/ Medicare, etc.) -- The pt. MUST have a primary care doctor that directs their care regularly and follows them in the community   [facility]  [phone]   [employer]  [phone]    Agencies that provide inexpensive medical care: Organization         Address  Phone   Notes  [place]  [phone]   [facility]    [phone]   [facility] [place], [place] [address] [phone]   [place] [address], [place] [phone]   Planned Parenthood    [phone]   [facility]    [phone]   [place] [address], [place] Phone:  [phone], Fax:  [phone] Hours of Operation:  9 am - 6 pm, M-F.  Also accepts Medicaid/Medicare and self-pay.  [facility] for [place], [address], [place] Phone: [phone], Fax: [phone]. Hours of Operation:  8:30 am - 5:30 pm, M-F.  Also accepts Medicaid and self-pay.  [facility] [place] [address] [place] Phone: [phone]   [place], [place], [place] [phone], Ext. [phone] Mondays & Thursdays: 7-9 AM.  First 15 patients are seen on a first come, first serve basis.    [place] Providers:  Organization         Address  Phone   Notes  [facility] [address], [place] [phone] Also accepts self-pay patients.  [facility] [address], [place]  [phone]   [place], [address], [place] [phone]   [facility] [address] [place] [phone]   Lucianne Lei [address], [place]   [phone] Only accepts [place] patients after they have their name applied to their card.   Self-Pay (no insurance) in [facility]:  Organization         Address  Phone   Notes  Sickle Cell Patients, [facility] [place] [phone]   [facility] [place] [phone]   [facility] [place]  [place], [place], [place] [phone])  [phone]   [facility]/Dr. Vista Lawman  [address], [place] or [address], [place] [phone] Phone number for both [place] and [place] locations is the same.  [facility] [address] [place] [phone]   [facility] [address], [place] or [address] [phone]   [facility] [address], [place] [phone], phone; [phone], fax Sees patients 1st and 3rd Saturday of every month.  Must not qualify for public or private insurance (i.e. Medicaid, Medicare, [place] Health Choice, Veterans' Benefits)  Household income should be no more than 200% of the poverty level The clinic cannot treat you if you are pregnant or think you are pregnant  Sexually transmitted diseases are not treated at the clinic.    Dental Care: Organization         Address  Phone  Notes  [facility] Department of [facility] [place] [phone] Accepts children up to age 52 who are enrolled in [place] or [place]; pregnant women with a Medicaid card; and children who have applied for Medicaid or [place] Health Choice, but were declined, whose parents can pay a reduced fee at time of service.  [facility] of [facility]  [address], [place] [phone] Accepts children up to age 78 who are enrolled  in [place] or [place]; pregnant women with a Medicaid card; and children who have applied for Medicaid or [place] Health Choice, but were declined, whose parents can pay a reduced fee at time of service.  [place] [facility]  [place] [phone] Patients are seen by appointment only. Walk-ins are not accepted. [place] will see patients 21 years of age and older. Monday - Tuesday (8am-5pm) Most Wednesdays (8:30-5pm) $30 per visit, cash only  [facility] Adult Dental Access PROGRAM  [address], [facility] [phone] Patients are seen by appointment only. Walk-ins are not accepted. [place] will see patients 58 years of age and older. One Wednesday Evening (Monthly: Volunteer Based).  $30 per visit, cash only  [place]  [phone] for adults; Children under age 35, call Graduate Pediatric Dentistry at [phone]. Children aged 42-14, please call [phone] to request a pediatric application.  Dental services are provided in all areas of dental care including fillings, crowns and bridges, complete and partial dentures, implants, gum treatment, root canals, and extractions. Preventive care is also provided. Treatment is provided to both adults and children. Patients are selected via a lottery and there is often a waiting list.   [facility] [address] [place]  [phone] [email or web address]   [facility] [address] [place], [place] [phone], Ext. [phone] Second and Fourth Thursday of each month, opens at 6:30 AM; Clinic ends at 9 AM.  Patients are seen on a first-come first-served basis, and a limited number are seen during each clinic.   [facility]  [address] Hillard Danker [place], [place] [phone]   Eligibility Requirements You must have lived in [place], [place], or [place] counties for at least the last three months.   You cannot be  eligible for state or federal sponsored [employer], including [employer], [place], or [employer].   You generally cannot be eligible  for healthcare insurance through your employer.    How to apply: Eligibility screenings are held every Tuesday and Wednesday afternoon from 1:00 pm until 4:00 pm. You do not need an appointment for the interview!  [facility] [address] [place], [place]   [place]  [place] Department  [place]  [phone]    Behavioral Health Resources in the Community: Intensive Outpatient Programs Organization         Address  Phone  Notes  [place]. [address] [place], [place] [phone]   [facility] Outpatient [address], [place], [place]   [facility] [address] [place], [place]   [place] [address],  [place], [place] or [phone]   Substance Abuse Resources Organization         Address  Phone  Notes  [facility]  [phone]   [place]  [phone]   [facility] [place]   Chinita Pester  [phone]   Residential & Outpatient Substance Abuse Program  [phone]   Psychological Services Organization         Address  Phone  Notes  [facility]  [place]  [phone]   [place] [address] [place] or [phone]    Mobile Crisis Teams Organization         Address  Phone  Notes  [facility], Mobile Crisis Care Unit  [phone]   [facility]  [address] [place], [place]   [place] [address] [place] [phone]    Self-Help/Support Groups Organization          Address  Phone             Notes  [place]. of [place] - variety of support groups  [place] Call for more information  Narcotics Anonymous (NA), [facility] [address], [employer] [place]  2 meetings at this location   Special educational needs teacher         Address  Phone  Notes  [facility] [place],    [place]  [phone]   [facility]  [address] [place] [address], [place], [place]   [place], [place] [phone] Admissions: 8am-3pm M-F  [facility]stance [place] [address]    [place], [place] [phone]   [facility] [address] [place], [place], [place]   [facility] [address],  [place], [place]   [facility] - Intensive Outpatient [address], [place], [place]   [facility] ([place].,  [place], [place] [phone] or [phone]   [facility] ([facility]) [address], [place], [place] Accepts Medicaid  [facility] [address],  [place] [phone] Substance Abuse/Addiction Treatment   [facility] Organization         Address  Phone  Notes  [facility]  [phone]   Domenic Schwab, PhD [address] Arlis Porta [place], [place]   [phone] or [phone]   [place], [place] (  [phone]   [address], [place], [place] [phone] Insurance/Medicaid/sponsorship through [employer] and Families [address], [place], [place] [phone] [place] [address]   [place], [place] [phone]    Dr. Adele Schilder  [phone]   [facility] Dept. 1) [address], [place] 2) [place] 3)  [address], [place] [phone]  [phone]   [place] [phone] or [phone] (After Hours)

## 2014-09-18 NOTE — ED Notes (Signed)
The pt wants to go home.  The doctor has given discharge papers.  His wife has been called and she is coming to get him

## 2014-09-19 NOTE — ED Notes (Signed)
Pt left with his wife

## 2014-09-20 ENCOUNTER — Other Ambulatory Visit (HOSPITAL_COMMUNITY): Payer: Commercial Managed Care - PPO

## 2014-09-23 ENCOUNTER — Other Ambulatory Visit (HOSPITAL_COMMUNITY): Payer: Commercial Managed Care - PPO

## 2014-09-23 ENCOUNTER — Encounter (HOSPITAL_COMMUNITY): Payer: Self-pay | Admitting: Emergency Medicine

## 2014-09-23 DIAGNOSIS — F10120 Alcohol abuse with intoxication, uncomplicated: Secondary | ICD-10-CM | POA: Diagnosis present

## 2014-09-23 DIAGNOSIS — F10230 Alcohol dependence with withdrawal, uncomplicated: Secondary | ICD-10-CM | POA: Insufficient documentation

## 2014-09-23 DIAGNOSIS — Z79899 Other long term (current) drug therapy: Secondary | ICD-10-CM | POA: Insufficient documentation

## 2014-09-23 DIAGNOSIS — F319 Bipolar disorder, unspecified: Secondary | ICD-10-CM | POA: Diagnosis not present

## 2014-09-23 DIAGNOSIS — F419 Anxiety disorder, unspecified: Secondary | ICD-10-CM | POA: Insufficient documentation

## 2014-09-23 LAB — COMPREHENSIVE METABOLIC PANEL
ALK PHOS: 51 U/L (ref 39–117)
ALT: 65 U/L — ABNORMAL HIGH (ref 0–53)
AST: 73 U/L — AB (ref 0–37)
Albumin: 4.1 g/dL (ref 3.5–5.2)
Anion gap: 10 (ref 5–15)
BUN: 7 mg/dL (ref 6–23)
CALCIUM: 8.5 mg/dL (ref 8.4–10.5)
CHLORIDE: 100 mmol/L (ref 96–112)
CO2: 28 mmol/L (ref 19–32)
Creatinine, Ser: 0.94 mg/dL (ref 0.50–1.35)
GFR calc non Af Amer: >90 mL/min (ref >90)
GLUCOSE: 131 mg/dL — AB (ref 70–99)
Potassium: 4.1 mmol/L (ref 3.5–5.1)
SODIUM: 138 mmol/L (ref 135–145)
Total Bilirubin: 1.1 mg/dL (ref 0.3–1.2)
Total Protein: 6.9 g/dL (ref 6.0–8.3)

## 2014-09-23 LAB — CBC WITH DIFFERENTIAL/PLATELET
Basophils Absolute: 0 10*3/uL (ref 0.0–0.1)
Basophils Relative: 1 % (ref 0–1)
EOS PCT: 0 % (ref 0–5)
Eosinophils Absolute: 0 10*3/uL (ref 0.0–0.7)
HCT: 44.5 % (ref 39.0–52.0)
Hemoglobin: 16.4 g/dL (ref 13.0–17.0)
Lymphocytes Relative: 23 % (ref 12–46)
Lymphs Abs: 1.2 10*3/uL (ref 0.7–4.0)
MCH: 31.8 pg (ref 26.0–34.0)
MCHC: 36.9 g/dL — AB (ref 30.0–36.0)
MCV: 86.2 fL (ref 78.0–100.0)
Monocytes Absolute: 0.2 10*3/uL (ref 0.1–1.0)
Monocytes Relative: 4 % (ref 3–12)
NEUTROS PCT: 73 % (ref 43–77)
Neutro Abs: 3.7 10*3/uL (ref 1.7–7.7)
PLATELETS: 206 10*3/uL (ref 150–400)
RBC: 5.16 MIL/uL (ref 4.22–5.81)
RDW: 12.1 % (ref 11.5–15.5)
WBC: 5.1 10*3/uL (ref 4.0–10.5)

## 2014-09-23 MED ORDER — ONDANSETRON 4 MG PO TBDP
8.0000 mg | ORAL_TABLET | Freq: Once | ORAL | Status: AC
  Administered 2014-09-23: 8 mg via ORAL

## 2014-09-23 MED ORDER — ONDANSETRON 4 MG PO TBDP
ORAL_TABLET | ORAL | Status: AC
  Filled 2014-09-23: qty 2

## 2014-09-23 NOTE — ED Notes (Signed)
Patient here with complaint of nausea and vomiting for 3 days secondary to drinking for 7 days. States history similar binging episodes. States to many occurences of emesis to count over past 3 days. Denies pain.

## 2014-09-24 ENCOUNTER — Encounter (HOSPITAL_COMMUNITY): Payer: Self-pay | Admitting: Intensive Care

## 2014-09-24 ENCOUNTER — Inpatient Hospital Stay (HOSPITAL_COMMUNITY)
Admission: AD | Admit: 2014-09-24 | Discharge: 2014-09-26 | DRG: 897 | Disposition: A | Discharge status: Home / Self Care | Payer: Commercial Managed Care - PPO | Source: Intra-hospital | Attending: Psychiatry | Admitting: Psychiatry

## 2014-09-24 ENCOUNTER — Emergency Department (HOSPITAL_COMMUNITY)
Admission: EM | Admit: 2014-09-24 | Discharge: 2014-09-24 | Disposition: A | Discharge status: Other Acute Inpatient Hospital | Payer: Commercial Managed Care - PPO | Attending: Emergency Medicine | Admitting: Emergency Medicine

## 2014-09-24 DIAGNOSIS — F319 Bipolar disorder, unspecified: Secondary | ICD-10-CM | POA: Diagnosis present

## 2014-09-24 DIAGNOSIS — F10239 Alcohol dependence with withdrawal, unspecified: Secondary | ICD-10-CM | POA: Diagnosis present

## 2014-09-24 DIAGNOSIS — Z91013 Allergy to seafood: Secondary | ICD-10-CM

## 2014-09-24 DIAGNOSIS — F1994 Other psychoactive substance use, unspecified with psychoactive substance-induced mood disorder: Secondary | ICD-10-CM | POA: Diagnosis present

## 2014-09-24 DIAGNOSIS — F10230 Alcohol dependence with withdrawal, uncomplicated: Secondary | ICD-10-CM | POA: Diagnosis not present

## 2014-09-24 DIAGNOSIS — F411 Generalized anxiety disorder: Secondary | ICD-10-CM | POA: Diagnosis present

## 2014-09-24 DIAGNOSIS — G4700 Insomnia, unspecified: Secondary | ICD-10-CM | POA: Diagnosis present

## 2014-09-24 DIAGNOSIS — F1020 Alcohol dependence, uncomplicated: Secondary | ICD-10-CM | POA: Diagnosis present

## 2014-09-24 LAB — RAPID URINE DRUG SCREEN, HOSP PERFORMED
AMPHETAMINES: NOT DETECTED
BARBITURATES: NOT DETECTED
BENZODIAZEPINES: NOT DETECTED
Cocaine: NOT DETECTED
OPIATES: NOT DETECTED
TETRAHYDROCANNABINOL: NOT DETECTED

## 2014-09-24 LAB — ETHANOL
ALCOHOL ETHYL (B): 399 mg/dL — AB (ref 0–9)
ALCOHOL ETHYL (B): 126 mg/dL — AB (ref 0–9)

## 2014-09-24 MED ORDER — HYDROXYZINE HCL 25 MG PO TABS
25.0000 mg | ORAL_TABLET | Freq: Four times a day (QID) | ORAL | Status: DC | PRN
  Administered 2014-09-24: 25 mg via ORAL
  Filled 2014-09-24: qty 1

## 2014-09-24 MED ORDER — MAGNESIUM HYDROXIDE 400 MG/5ML PO SUSP: 30.0000 mL | Freq: Every day | ORAL | Status: DC | PRN

## 2014-09-24 MED ORDER — ACETAMINOPHEN 325 MG PO TABS: 650.0000 mg | ORAL_TABLET | Freq: Four times a day (QID) | ORAL | Status: DC | PRN

## 2014-09-24 MED ORDER — ACETAMINOPHEN 325 MG PO TABS: 650.0000 mg | ORAL_TABLET | ORAL | Status: DC | PRN

## 2014-09-24 MED ORDER — LORAZEPAM 1 MG PO TABS: 1.0000 mg | ORAL_TABLET | Freq: Two times a day (BID) | ORAL | Status: DC

## 2014-09-24 MED ORDER — LORAZEPAM 1 MG PO TABS: 1.0000 mg | ORAL_TABLET | Freq: Every day | ORAL | Status: DC

## 2014-09-24 MED ORDER — THIAMINE HCL 100 MG/ML IJ SOLN: 100.0000 mg | Freq: Once | INTRAMUSCULAR | Status: DC

## 2014-09-24 MED ORDER — LORAZEPAM 1 MG PO TABS
1.0000 mg | ORAL_TABLET | Freq: Four times a day (QID) | ORAL | Status: DC | PRN
  Administered 2014-09-24: 1 mg via ORAL
  Filled 2014-09-24 (×2): qty 1

## 2014-09-24 MED ORDER — VITAMIN B-1 100 MG PO TABS
100.0000 mg | ORAL_TABLET | Freq: Every day | ORAL | Status: DC
  Administered 2014-09-25 – 2014-09-26 (×2): 100 mg via ORAL
  Filled 2014-09-24 (×4): qty 1

## 2014-09-24 MED ORDER — ONDANSETRON 4 MG PO TBDP
4.0000 mg | ORAL_TABLET | Freq: Four times a day (QID) | ORAL | Status: DC | PRN
Start: 2014-09-24 — End: 2014-09-26
  Administered 2014-09-24: 4 mg via ORAL
  Filled 2014-09-24: qty 1

## 2014-09-24 MED ORDER — ALUM & MAG HYDROXIDE-SIMETH 200-200-20 MG/5ML PO SUSP
30.0000 mL | ORAL | Status: DC | PRN
Start: 2014-09-24 — End: 2014-09-26

## 2014-09-24 MED ORDER — SODIUM CHLORIDE 0.9 % IV BOLUS (SEPSIS)
2000.0000 mL | Freq: Once | INTRAVENOUS | Status: AC
  Administered 2014-09-24: 2000 mL via INTRAVENOUS

## 2014-09-24 MED ORDER — ADULT MULTIVITAMIN W/MINERALS CH
1.0000 | ORAL_TABLET | Freq: Every day | ORAL | Status: DC
  Administered 2014-09-24 – 2014-09-26 (×3): 1 via ORAL
  Filled 2014-09-24 (×2): qty 1
  Filled 2014-09-24: qty 14
  Filled 2014-09-24 (×4): qty 1
  Filled 2014-09-24: qty 14

## 2014-09-24 MED ORDER — LORAZEPAM 1 MG PO TABS
0.0000 mg | ORAL_TABLET | Freq: Four times a day (QID) | ORAL | Status: DC
  Administered 2014-09-24: 1 mg via ORAL
  Administered 2014-09-24: 2 mg via ORAL
  Filled 2014-09-24: qty 1
  Filled 2014-09-24: qty 2

## 2014-09-24 MED ORDER — LORAZEPAM 2 MG/ML IJ SOLN
0.5000 mg | Freq: Once | INTRAMUSCULAR | Status: AC
Start: 2014-09-24 — End: 2014-09-24
  Administered 2014-09-24: 0.5 mg via INTRAVENOUS
  Filled 2014-09-24: qty 1

## 2014-09-24 MED ORDER — LORAZEPAM 1 MG PO TABS
1.0000 mg | ORAL_TABLET | Freq: Four times a day (QID) | ORAL | Status: AC
  Administered 2014-09-24 – 2014-09-25 (×6): 1 mg via ORAL
  Filled 2014-09-24 (×6): qty 1

## 2014-09-24 MED ORDER — TRAZODONE HCL 50 MG PO TABS
50.0000 mg | ORAL_TABLET | Freq: Every evening | ORAL | Status: DC | PRN
  Administered 2014-09-24: 50 mg via ORAL
  Filled 2014-09-24: qty 1

## 2014-09-24 MED ORDER — ONDANSETRON HCL 4 MG PO TABS
4.0000 mg | ORAL_TABLET | Freq: Three times a day (TID) | ORAL | Status: DC | PRN
  Administered 2014-09-24: 4 mg via ORAL
  Filled 2014-09-24: qty 1

## 2014-09-24 MED ORDER — LORAZEPAM 1 MG PO TABS: 0.0000 mg | ORAL_TABLET | Freq: Two times a day (BID) | ORAL | Status: DC

## 2014-09-24 MED ORDER — LORAZEPAM 1 MG PO TABS
1.0000 mg | ORAL_TABLET | Freq: Three times a day (TID) | ORAL | Status: DC
  Administered 2014-09-26 (×2): 1 mg via ORAL
  Filled 2014-09-24: qty 1

## 2014-09-24 MED ORDER — ONDANSETRON HCL 4 MG/2ML IJ SOLN
4.0000 mg | Freq: Once | INTRAMUSCULAR | Status: AC
  Administered 2014-09-24: 4 mg via INTRAVENOUS
  Filled 2014-09-24: qty 2

## 2014-09-24 MED ORDER — NICOTINE 21 MG/24HR TD PT24
21.0000 mg | MEDICATED_PATCH | Freq: Every day | TRANSDERMAL | Status: DC
  Filled 2014-09-24 (×4): qty 1

## 2014-09-24 MED ORDER — LOPERAMIDE HCL 2 MG PO CAPS: 2.0000 mg | ORAL_CAPSULE | ORAL | Status: DC | PRN

## 2014-09-24 MED ORDER — IBUPROFEN 400 MG PO TABS: 600.0000 mg | ORAL_TABLET | Freq: Three times a day (TID) | ORAL | Status: DC | PRN

## 2014-09-24 MED ORDER — NICOTINE 21 MG/24HR TD PT24: 21.0000 mg | MEDICATED_PATCH | Freq: Every day | TRANSDERMAL | Status: DC

## 2014-09-24 MED ORDER — ZOLPIDEM TARTRATE 5 MG PO TABS: 5.0000 mg | ORAL_TABLET | Freq: Every evening | ORAL | Status: DC | PRN

## 2014-09-24 MED ORDER — METHOCARBAMOL 500 MG PO TABS
1000.0000 mg | ORAL_TABLET | Freq: Once | ORAL | Status: AC
  Administered 2014-09-24: 1000 mg via ORAL
  Filled 2014-09-24: qty 2

## 2014-09-24 NOTE — BH Assessment (Addendum)
Tele Assessment Note   Chad Mclaughlin is an 34 y.o. male.  -Clinician attempted to call Dr. Lita Mains to get clinicals but he was in a trauma.  Patient is wanting to get detox.  Just got out of [facility].  Patient has been binge drinking since he was discharged from [facility] on 01/15.  He is drinking about 18 beers and betwixt 6-10 shots of vodka daily.  He drank this amount before coming in to [facility].  He admits to doing a line of cocaine about 4 days ago.  Denies any HI, SI or A/V hallucinations.  Patient has no current outpatient services .  He was given referrals upon [facility] d/c but has not followed up yet.  Patient has been with [facility] in December, November & October.  Patient says that his wife is wanting to leave him.  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  -Pt care discussed with Patriciaann Clan, [place].  He declined patient at this time due to pt being at [facility] three times in last three months and having been at a fourth facility also.  Other placement needs to be sought.    Axis I: Substance Induced Mood Disorder and 303.90 ETOH use d/o severe Axis II: Deferred Axis III:  Past Medical History  Diagnosis Date  . Anxiety   . Bipolar affective disorder   . Alcohol abuse   . Depression    Axis IV: economic problems, other psychosocial or environmental problems and problems related to social environment Axis V: 31-40 impairment in reality testing  Past Medical History:  Past Medical History  Diagnosis Date  . Anxiety   . Bipolar affective disorder   . Alcohol abuse   . Depression     History reviewed. No pertinent past surgical history.  Family History: History reviewed. No pertinent family history.  Social History:  reports that he has never smoked. He does not have any smokeless tobacco history on file. He reports that he drinks about 7.2 oz of alcohol per week. He reports that he does not use illicit drugs.  Additional Social History:  Alcohol  / Drug Use Pain Medications: None Prescriptions: Paxil 40 mg, Lamictol 50mg , Seroquel (when pt is not drinkiing) Over the Counter: None Withdrawal Symptoms: Tingling, Cramps, Weakness, Patient aware of relationship between substance abuse and physical/medical complications, Fever / Chills, Nausea / Vomiting, Sweats, Blackouts, Seizures Onset of Seizures: One or two times while detoxing Date of most recent seizure: Pt cannot remember.  May have happened when at home. Substance #1 Name of Substance 1: ETOH (vodka & beer) 1 - Age of First Use: 35 years of age 33 - Amount (size/oz): 18 beers and vodka shots (6-10 shots) 1 - Frequency: Daily for the last week 1 - Duration: Binging for the last week 1 - Last Use / Amount: 01/25 Pt cannot remember amount consumed. Substance #2 Name of Substance 2: Cocaine 2 - Age of First Use: Teens 2 - Amount (size/oz): One line about 4 days ago. 2 - Frequency: First time in four years 2 - Duration: One use in 4 years 2 - Last Use / Amount: 4 days ago.  CIWA: CIWA-Ar BP: 110/85 mmHg Pulse Rate: 86 Nausea and Vomiting: no nausea and no vomiting Tactile Disturbances: none Tremor: no tremor Auditory Disturbances: not present Paroxysmal Sweats: no sweat visible Visual Disturbances: not present Anxiety: no anxiety, at ease Headache, Fullness in Head: none present Agitation: normal activity Orientation and Clouding  of Sensorium: oriented and can do serial additions CIWA-Ar Total: 0 COWS:    PATIENT STRENGTHS: (choose at least two) Average or above average intelligence Communication skills General fund of knowledge Supportive family/friends  Allergies:  Allergies  Allergen Reactions  . Shellfish Allergy Anaphylaxis    Home Medications:  (Not in a hospital admission)  OB/GYN Status:  No LMP for male patient.  General Assessment Data Location of Assessment: [facility] ED Is this a Tele or Face-to-Face Assessment?: Tele Assessment Is this an Initial  Assessment or a Re-assessment for this encounter?: Initial Assessment Living Arrangements: Spouse/significant other Can pt return to current living arrangement?: Yes Admission Status: Voluntary Is patient capable of signing voluntary admission?: Yes Transfer from: [facility] Referral Source: Self/Family/Friend     [place] Living Arrangements: Spouse/significant other Name of Psychiatrist: No psychiatrist now. Name of Therapist: None now     Risk to self with the past 6 months Suicidal Ideation: No Suicidal Intent: No Is patient at risk for suicide?: No Suicidal Plan?: No Specify Current Suicidal Plan: None Access to Means: No Specify Access to Suicidal Means: NOne What has been your use of drugs/alcohol within the last 12 months?: ETOH daily Previous Attempts/Gestures: No How many times?: 0 Other Self Harm Risks: SA issues Triggers for Past Attempts: None known Intentional Self Injurious Behavior: None Family Suicide History: No Recent stressful life event(s): Job Loss, Divorce (Pt says he is about to lose his job and his wife.) Persecutory voices/beliefs?: No Depression: Yes Depression Symptoms: Despondent, Insomnia, Isolating, Guilt, Loss of interest in usual pleasures, Feeling worthless/self pity Substance abuse history and/or treatment for substance abuse?: Yes (Discharged from [facility]) Suicide prevention information given to non-admitted patients: Not applicable  Risk to Others within the past 6 months Homicidal Ideation: No Thoughts of Harm to Others: No Current Homicidal Intent: No Current Homicidal Plan: No Access to Homicidal Means: No Identified Victim: No one History of harm to others?: No Assessment of Violence: None Noted Violent Behavior Description: Pt calm and ooperative Does patient have access to weapons?: No Criminal Charges Pending?: No Does patient have a court date: No  Psychosis Hallucinations: Visual  (Will see bugs or spots in the air (when detoxing)) Delusions: None noted  Mental Status Report Appear/Hygiene: Unremarkable, In hospital gown Eye Contact: Good Motor Activity: Freedom of movement, Unsteady Speech: Logical/coherent Level of Consciousness: Alert Mood: Depressed, Sad Affect: Sad Anxiety Level: Panic Attacks Panic attack frequency:  (Once or twice a day) Most recent panic attack: Today Thought Processes: Coherent, Relevant Judgement: Impaired Orientation: Person, Place, Time, Situation Obsessive Compulsive Thoughts/Behaviors: Minimal (Counting spaces in the road and checking the locks on doors.)  Cognitive Functioning Concentration: Decreased Memory: Recent Impaired, Remote Intact IQ: Average Insight: Fair Impulse Control: Poor Appetite: Poor Weight Loss: 0 Weight Gain: 0 Sleep: Decreased Total Hours of Sleep:  (Up and down.) Vegetative Symptoms: None  ADLScreening [facility] Assessment Services) Patient's cognitive ability adequate to safely complete daily activities?: Yes Patient able to express need for assistance with ADLs?: Yes Independently performs ADLs?: Yes (appropriate for developmental age)  Prior Inpatient Therapy Prior Inpatient Therapy: Yes Prior Therapy Dates: 2015 3 times Prior Therapy Facilty[facility]Provider[facility] Reason for Treatment: Alcohol Dependence  Prior Outpatient Therapy Prior Outpatient Therapy: Yes Prior Therapy Dates: Sept, Oct, Nove Dec of 2015 Prior Therapy Facilty[facility] Reason for Treatment: Alcohol Dependence  ADL Screening (condition at time of admission) Patient's cognitive ability adequate to safely complete  daily activities?: Yes Is the patient deaf or have difficulty hearing?: No Does the patient have difficulty seeing, even when wearing glasses/contacts?: No (Pt wears glasses.) Does the patient have difficulty concentrating, remembering, or making decisions?: No Patient able to  express need for assistance with ADLs?: Yes Does the patient have difficulty dressing or bathing?: No Independently performs ADLs?: Yes (appropriate for developmental age) Does the patient have difficulty walking or climbing stairs?: No Weakness of Legs: None Weakness of Arms/Hands: None       Abuse/Neglect Assessment (Assessment to be complete while patient is alone) Physical Abuse: Denies Verbal Abuse: Denies Sexual Abuse: Denies Exploitation of patient/patient's resources: Denies Self-Neglect: Denies     Regulatory affairs officer (For Healthcare) Does patient have an advance directive?: No Would patient like information on creating an advanced directive?: No - patient declined information    Additional Information 1:1 In Past 12 Months?: No CIRT Risk: No Elopement Risk: No Does patient have medical clearance?: Yes     Disposition:  Disposition Initial Assessment Completed for this Encounter: Yes Disposition of Patient: Inpatient treatment program, Referred to Type of inpatient treatment program: Adult Patient referred to: Other (Comment) (Refer to [facility].)  Raymondo Band 09/24/2014 6:46 AM

## 2014-09-24 NOTE — ED Notes (Signed)
Patient awake alert and oriented, Speaking to counselor on telepsych computer.

## 2014-09-24 NOTE — ED Notes (Signed)
Pt using phone at nurse's station

## 2014-09-24 NOTE — ED Notes (Signed)
SPOKE TO [facility] AD[facility]. THEY ADVISE THAT THEY CANNOT ACCEPT PATIENT. THEIR POLICY IS NO READMITS FOR 30 DAYS. PT DISCHARGE ON 1/15/

## 2014-09-24 NOTE — Progress Notes (Signed)
Patient admitted to [facility] from [facility]. Patient admitted for alcohol detox. Patient reports binge drinking "6-14 beers a day" after his recent discharge from [facility] 10 days ago. 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 The patient is exhibiting tremors, nausea, sweating, anxiety, and agitation (CIWA 14). NP [place] notified for orders. The patient was given education regarding unit rules and regulations. Patient was given education regarding fall risk status (low). Report given to Adonis Huguenin, Therapist, sports.

## 2014-09-24 NOTE — Tx Team (Addendum)
Initial Interdisciplinary Treatment Plan   PATIENT STRESSORS: Marital or family conflict Substance abuse   PATIENT STRENGTHS: Ability for insight Average or above average intelligence Capable of independent living Communication skills   PROBLEM LIST: Problem List/Patient Goals Date to be addressed Date deferred Reason deferred Estimated date of resolution  ETOH Abuse 09/24/14           "back to not drinking"                                           DISCHARGE CRITERIA:  Ability to meet basic life and health needs Adequate post-discharge living arrangements Improved stabilization in mood, thinking, and/or behavior Medical problems require only outpatient monitoring  PRELIMINARY DISCHARGE PLAN: Attend aftercare/continuing care group Outpatient therapy  PATIENT/FAMIILY INVOLVEMENT: This treatment plan has been presented to and reviewed with the patient, Chad Mclaughlin.  The patient has been given the opportunity to ask questions and make suggestions.  Algis Greenhouse, RN 09/24/2014, 2:59 PM

## 2014-09-24 NOTE — H&P (Signed)
Psychiatric Admission Assessment Adult  Patient Identification: Chad Mclaughlin MRN:  [id number] Date of Evaluation:  09/24/2014 Chief Complaint:  SUBSTANCE INDUCED MOOD DISORDER ALCOHOL USE DISORDER,SEVERE Principal Diagnosis: <principal problem not specified> Diagnosis:   Patient Active Problem List   Diagnosis Date Noted  . Alcohol dependence with uncomplicated withdrawal [T24.580]   . Substance induced mood disorder [F19.94] 08/13/2014  . GAD (generalized anxiety disorder) [F41.1] 06/24/2014  . Alcohol dependence [F10.20] 06/22/2014  . Alcohol withdrawal [F10.239] 05/22/2014  . Other pancytopenia [D61.818] 05/22/2014  . Hypokalemia [E87.6] 05/22/2014  . Alcoholic ketoacidosis [D98.3] 05/20/2014  . Nausea and vomiting [R11.2] 05/20/2014   History of Present Illness: Chad Mclaughlin is 34 years old. A caucasian male. He reports, "I just got out of the [facility] for alcohol abuse treatment on 09/13/14. I was doing fine for few days. I really did think that I could handle just a drink of alcohol. Once I picked up the drink, it was a total relapse. There is no good reason for my immediate relapse, however, knowing that I was losing my job & my marriage did not help it. My wife decided to separate from me because I was no longer the man she married. I was drinking 12  to a case of beer daily with shots of vodka. I was also feeling very sick. My head was stopped-up. My family encouraged me to come back to [facility] for detox. This is my 4th admission to this hospital. I was discharged from this hospital last December then went to the [facility]. I was at the [facility] a while ago for my alcoholism. I'm not SIHI, just depressed, anxious & disappointed in my self. I have let everyone in my family down, again"  Elements:  Location:  Alcohol dependence. Quality:  Tremors, high anxiety. Severity:  Severe. Timing:  Started drinking heavily 09/14/14. Duration:  Chronic alcoholism. Context:  Recently  got out of [facility] fro alcohol dependence, taught I could handle a drink, drank more & more till I was drinking up to a case daily.  Associated Signs/Symptoms:  Depression Symptoms:  depressed mood, insomnia, hopelessness, anxiety,  (Hypo) Manic Symptoms:  Impulsivity,  Anxiety Symptoms:  Excessive Worry, high anxiety levels  Psychotic Symptoms:  Denies any hallucinations  PTSD Symptoms: NA  Total Time spent with patient: 1 hour  Past Medical History:  Past Medical History  Diagnosis Date  . Anxiety   . Bipolar affective disorder   . Alcohol abuse   . Depression    History reviewed. No pertinent past surgical history. Family History: History reviewed. No pertinent family history. Social History:  History  Alcohol Use  . 7.2 oz/week  . 12 Cans of beer per week    Comment: daily      History  Drug Use No    History   Social History  . Marital Status: Married    Spouse Name: [name]    Number of Children: [name]  . Years of Education: [name]   Social History Main Topics  . Smoking status: Never Smoker   . Smokeless tobacco: None  . Alcohol Use: 7.2 oz/week    12 Cans of beer per week     Comment: daily   . Drug Use: No  . Sexual Activity: No   Other Topics Concern  . None   Social History Narrative   Additional Social History:  Musculoskeletal: Strength & Muscle Tone: within normal limits Gait & Station: normal Patient leans: [name]  Psychiatric Specialty  Exam: Physical Exam  Constitutional: He is oriented to person, place, and time. He appears well-developed.  HENT:  Head: Normocephalic.  Eyes: Pupils are equal, round, and reactive to light.  Neck: Normal range of motion.  Cardiovascular: Normal rate.   Respiratory: Effort normal and breath sounds normal.  GI: Soft. Bowel sounds are normal.  Genitourinary:  Denies any issues  Musculoskeletal: Normal range of motion.  Neurological: He is alert and oriented to person, place, and time.  Skin:  Skin is warm and dry.  Psychiatric: His speech is normal. Thought content normal. His mood appears anxious. His affect is not angry, not blunt, not labile and not inappropriate. He is withdrawn. Cognition and memory are normal. He expresses impulsivity. He exhibits a depressed mood.    Review of Systems  Constitutional: Positive for chills, malaise/fatigue and diaphoresis.  HENT: Negative.   Eyes: Negative.   Respiratory: Negative.   Cardiovascular: Negative.   Gastrointestinal: Positive for nausea.  Genitourinary: Negative.   Skin: Negative.   Neurological: Positive for dizziness, tremors and weakness.  Endo/Heme/Allergies: Negative.   Psychiatric/Behavioral: Positive for depression and substance abuse (Alcoholism, chronic). Negative for suicidal ideas, hallucinations and memory loss. The patient is nervous/anxious and has insomnia.     Blood pressure 137/98, pulse 106, temperature 98.3 F (36.8 C), temperature source Oral, resp. rate 18, height 5' 4.25" (1.632 m), weight 68.38 kg (150 lb 12 oz).Body mass index is 25.67 kg/(m^2).  General Appearance: Casual  Eye Contact::  Good  Speech:  Clear and Coherent  Volume:  Normal  Mood:  Depressed and Hopeless  Affect:  Flat  Thought Process:  Coherent  Orientation:  Full (Time, Place, and Person)  Thought Content:  Rumination  Suicidal Thoughts:  No  Homicidal Thoughts:  No  Memory:  Immediate;   Good Recent;   Good Remote;   Good  Judgement:  Good  Insight:  Present  Psychomotor Activity:  Restlessness and Tremor  Concentration:  Good  Recall:  Good  Fund of Knowledge:Good  Language: Good  Akathisia:  No  Handed:  Right  AIMS (if indicated):     Assets:  Communication Skills Desire for Improvement  ADL's:  Intact  Cognition: WNL  Sleep:      Risk to Self: Is patient at risk for suicide?: No What has been your use of drugs/alcohol within the last 12 months?: Patient reports drinking a case of beer daily after relapsing a  week ago following discharge from [place] to Others:   Prior Inpatient Therapy:   Prior Outpatient Therapy:    Alcohol Screening: 1. How often do you have a drink containing alcohol?: 2 to 3 times a week 2. How many drinks containing alcohol do you have on a typical day when you are drinking?: 5 or 6 3. How often do you have six or more drinks on one occasion?: Monthly Preliminary Score: 4 4. How often during the last year have you found that you were not able to stop drinking once you had started?: Monthly 5. How often during the last year have you failed to do what was normally expected from you becasue of drinking?: Monthly 6. How often during the last year have you needed a first drink in the morning to get yourself going after a heavy drinking session?: Monthly 7. How often during the last year have you had a feeling of guilt of remorse after drinking?: Monthly 8. How often during the last year have you been unable to  remember what happened the night before because you had been drinking?: Monthly 9. Have you or someone else been injured as a result of your drinking?: No 10. Has a relative or friend or a doctor or another health worker been concerned about your drinking or suggested you cut down?: No Alcohol Use Disorder Identification Test Final Score (AUDIT): 17 Brief Intervention: Patient declined brief intervention  Allergies:   Allergies  Allergen Reactions  . Shellfish Allergy Anaphylaxis   Lab Results:  Results for orders placed or performed during the hospital encounter of 09/24/14 (from the past 48 hour(s))  CBC with Differential     Status: Abnormal   Collection Time: 09/23/14 10:30 PM  Result Value Ref Range   WBC 5.1 4.0 - 10.5 K/uL   RBC 5.16 4.22 - 5.81 MIL/uL   Hemoglobin 16.4 13.0 - 17.0 g/dL   HCT 44.5 39.0 - 52.0 %   MCV 86.2 78.0 - 100.0 fL   MCH 31.8 26.0 - 34.0 pg   MCHC 36.9 (H) 30.0 - 36.0 g/dL   RDW 12.1 11.5 - 15.5 %   Platelets 206 150 -  400 K/uL   Neutrophils Relative % 73 43 - 77 %   Neutro Abs 3.7 1.7 - 7.7 K/uL   Lymphocytes Relative 23 12 - 46 %   Lymphs Abs 1.2 0.7 - 4.0 K/uL   Monocytes Relative 4 3 - 12 %   Monocytes Absolute 0.2 0.1 - 1.0 K/uL   Eosinophils Relative 0 0 - 5 %   Eosinophils Absolute 0.0 0.0 - 0.7 K/uL   Basophils Relative 1 0 - 1 %   Basophils Absolute 0.0 0.0 - 0.1 K/uL  Comprehensive metabolic panel     Status: Abnormal   Collection Time: 09/23/14 10:30 PM  Result Value Ref Range   Sodium 138 135 - 145 mmol/L   Potassium 4.1 3.5 - 5.1 mmol/L   Chloride 100 96 - 112 mmol/L   CO2 28 19 - 32 mmol/L   Glucose, Bld 131 (H) 70 - 99 mg/dL   BUN 7 6 - 23 mg/dL   Creatinine, Ser 0.94 0.50 - 1.35 mg/dL   Calcium 8.5 8.4 - 10.5 mg/dL   Total Protein 6.9 6.0 - 8.3 g/dL   Albumin 4.1 3.5 - 5.2 g/dL   AST 73 (H) 0 - 37 U/L   ALT 65 (H) 0 - 53 U/L   Alkaline Phosphatase 51 39 - 117 U/L   Total Bilirubin 1.1 0.3 - 1.2 mg/dL   GFR calc non Af Amer >90 >90 mL/min   GFR calc Af Amer >90 >90 mL/min    Comment: (NOTE) The eGFR has been calculated using the CKD EPI equation. This calculation has not been validated in all clinical situations. eGFR's persistently <90 mL/min signify possible Chronic Kidney Disease.    Anion gap 10 5 - 15  Ethanol     Status: Abnormal   Collection Time: 09/24/14  1:20 AM  Result Value Ref Range   Alcohol, Ethyl (B) 399 (H) 0 - 9 mg/dL    Comment:        LOWEST DETECTABLE LIMIT FOR SERUM ALCOHOL IS 11 mg/dL FOR MEDICAL PURPOSES ONLY   Urine rapid drug screen (hosp performed)     Status: None   Collection Time: 09/24/14  2:14 AM  Result Value Ref Range   Opiates NONE DETECTED NONE DETECTED   Cocaine NONE DETECTED NONE DETECTED   Benzodiazepines NONE DETECTED NONE DETECTED   Amphetamines NONE  DETECTED NONE DETECTED   Tetrahydrocannabinol NONE DETECTED NONE DETECTED   Barbiturates NONE DETECTED NONE DETECTED    Comment:        DRUG SCREEN FOR MEDICAL  PURPOSES ONLY.  IF CONFIRMATION IS NEEDED FOR ANY PURPOSE, NOTIFY LAB WITHIN 5 DAYS.        LOWEST DETECTABLE LIMITS FOR URINE DRUG SCREEN Drug Class       Cutoff (ng/mL) Amphetamine      1000 Barbiturate      200 Benzodiazepine   725 Tricyclics       366 Opiates          300 Cocaine          300 THC              50   Ethanol     Status: Abnormal   Collection Time: 09/24/14 12:05 PM  Result Value Ref Range   Alcohol, Ethyl (B) 126 (H) 0 - 9 mg/dL    Comment:        LOWEST DETECTABLE LIMIT FOR SERUM ALCOHOL IS 11 mg/dL FOR MEDICAL PURPOSES ONLY    Current Medications: Current Facility-Administered Medications  Medication Dose Route Frequency Provider Last Rate Last Dose  . acetaminophen (TYLENOL) tablet 650 mg  650 mg Oral Q6H PRN Encarnacion Slates, NP      . alum & mag hydroxide-simeth (MAALOX/MYLANTA) 200-200-20 MG/5ML suspension 30 mL  30 mL Oral Q4H PRN Encarnacion Slates, NP      . hydrOXYzine (ATARAX/VISTARIL) tablet 25 mg  25 mg Oral Q6H PRN Encarnacion Slates, NP      . loperamide (IMODIUM) capsule 2-4 mg  2-4 mg Oral PRN Encarnacion Slates, NP      . LORazepam (ATIVAN) tablet 1 mg  1 mg Oral Q6H PRN Encarnacion Slates, NP   1 mg at 09/24/14 1538  . LORazepam (ATIVAN) tablet 1 mg  1 mg Oral QID Encarnacion Slates, NP   1 mg at 09/24/14 1737   Followed by  . [START ON 09/26/2014] LORazepam (ATIVAN) tablet 1 mg  1 mg Oral TID Encarnacion Slates, NP       Followed by  . [START ON 09/27/2014] LORazepam (ATIVAN) tablet 1 mg  1 mg Oral BID Encarnacion Slates, NP       Followed by  . [START ON 09/28/2014] LORazepam (ATIVAN) tablet 1 mg  1 mg Oral Daily Encarnacion Slates, NP      . magnesium hydroxide (MILK OF MAGNESIA) suspension 30 mL  30 mL Oral Daily PRN Encarnacion Slates, NP      . multivitamin with minerals tablet 1 tablet  1 tablet Oral Daily Encarnacion Slates, NP   1 tablet at 09/24/14 1538  . [START ON 09/25/2014] nicotine (NICODERM CQ - dosed in mg/24 hours) patch 21 mg  21 mg Transdermal Q0600 Encarnacion Slates, NP       . ondansetron (ZOFRAN-ODT) disintegrating tablet 4 mg  4 mg Oral Q6H PRN Encarnacion Slates, NP   4 mg at 09/24/14 1538  . thiamine (B-1) injection 100 mg  100 mg Intramuscular Once Encarnacion Slates, NP   100 mg at 09/24/14 1515  . [START ON 09/25/2014] thiamine (VITAMIN B-1) tablet 100 mg  100 mg Oral Daily Encarnacion Slates, NP      . traZODone (DESYREL) tablet 50 mg  50 mg Oral QHS PRN Encarnacion Slates, NP       PTA Medications:  Prescriptions prior to admission  Medication Sig Dispense Refill Last Dose  . hydrOXYzine (ATARAX/VISTARIL) 25 MG tablet Take 1 tablet (25 mg total) by mouth every 6 (six) hours as needed for anxiety. (Patient not taking: Reported on 09/24/2014) 45 tablet 0   . lamoTRIgine (LAMICTAL) 25 MG tablet Take 2 tablets (50 mg total) by mouth 2 (two) times daily. For mood stabilization 12 tablet 0 09/22/2014  . Multiple Vitamin (MULTIVITAMIN WITH MINERALS) TABS tablet Take 1 tablet by mouth daily. May purchase from over the counter at yr local pharmacy:  Vitamin supplement (Patient not taking: Reported on 09/24/2014)     . PARoxetine (PAXIL) 40 MG tablet Take 1 tablet (40 mg total) by mouth daily. For depression 30 tablet 0 09/22/2014  . QUEtiapine (SEROQUEL) 100 MG tablet Take 100 mg by mouth at bedtime.   Past Week at Unknown time  . traZODone (DESYREL) 25 mg TABS tablet Take 1.5 tablets (75 mg total) by mouth at bedtime as needed for sleep. (Patient not taking: Reported on 09/24/2014) 30 tablet 0     Previous Psychotropic Medications: Yes   Substance Abuse History in the last 12 months:  Yes.    Consequences of Substance Abuse: Medical Consequences:  Liver damage, Possible death by overdose Legal Consequences:  Arrests, jail time, Loss of driving privilege. Family Consequences:  Family discord, divorce and or separation.   Results for orders placed or performed during the hospital encounter of 09/24/14 (from the past 72 hour(s))  CBC with Differential     Status: Abnormal   Collection  Time: 09/23/14 10:30 PM  Result Value Ref Range   WBC 5.1 4.0 - 10.5 K/uL   RBC 5.16 4.22 - 5.81 MIL/uL   Hemoglobin 16.4 13.0 - 17.0 g/dL   HCT 44.5 39.0 - 52.0 %   MCV 86.2 78.0 - 100.0 fL   MCH 31.8 26.0 - 34.0 pg   MCHC 36.9 (H) 30.0 - 36.0 g/dL   RDW 12.1 11.5 - 15.5 %   Platelets 206 150 - 400 K/uL   Neutrophils Relative % 73 43 - 77 %   Neutro Abs 3.7 1.7 - 7.7 K/uL   Lymphocytes Relative 23 12 - 46 %   Lymphs Abs 1.2 0.7 - 4.0 K/uL   Monocytes Relative 4 3 - 12 %   Monocytes Absolute 0.2 0.1 - 1.0 K/uL   Eosinophils Relative 0 0 - 5 %   Eosinophils Absolute 0.0 0.0 - 0.7 K/uL   Basophils Relative 1 0 - 1 %   Basophils Absolute 0.0 0.0 - 0.1 K/uL  Comprehensive metabolic panel     Status: Abnormal   Collection Time: 09/23/14 10:30 PM  Result Value Ref Range   Sodium 138 135 - 145 mmol/L   Potassium 4.1 3.5 - 5.1 mmol/L   Chloride 100 96 - 112 mmol/L   CO2 28 19 - 32 mmol/L   Glucose, Bld 131 (H) 70 - 99 mg/dL   BUN 7 6 - 23 mg/dL   Creatinine, Ser 0.94 0.50 - 1.35 mg/dL   Calcium 8.5 8.4 - 10.5 mg/dL   Total Protein 6.9 6.0 - 8.3 g/dL   Albumin 4.1 3.5 - 5.2 g/dL   AST 73 (H) 0 - 37 U/L   ALT 65 (H) 0 - 53 U/L   Alkaline Phosphatase 51 39 - 117 U/L   Total Bilirubin 1.1 0.3 - 1.2 mg/dL   GFR calc non Af Amer >90 >90 mL/min   GFR calc Af Amer >  90 >90 mL/min    Comment: (NOTE) The eGFR has been calculated using the CKD EPI equation. This calculation has not been validated in all clinical situations. eGFR's persistently <90 mL/min signify possible Chronic Kidney Disease.    Anion gap 10 5 - 15  Ethanol     Status: Abnormal   Collection Time: 09/24/14  1:20 AM  Result Value Ref Range   Alcohol, Ethyl (B) 399 (H) 0 - 9 mg/dL    Comment:        LOWEST DETECTABLE LIMIT FOR SERUM ALCOHOL IS 11 mg/dL FOR MEDICAL PURPOSES ONLY   Urine rapid drug screen (hosp performed)     Status: None   Collection Time: 09/24/14  2:14 AM  Result Value Ref Range   Opiates NONE  DETECTED NONE DETECTED   Cocaine NONE DETECTED NONE DETECTED   Benzodiazepines NONE DETECTED NONE DETECTED   Amphetamines NONE DETECTED NONE DETECTED   Tetrahydrocannabinol NONE DETECTED NONE DETECTED   Barbiturates NONE DETECTED NONE DETECTED    Comment:        DRUG SCREEN FOR MEDICAL PURPOSES ONLY.  IF CONFIRMATION IS NEEDED FOR ANY PURPOSE, NOTIFY LAB WITHIN 5 DAYS.        LOWEST DETECTABLE LIMITS FOR URINE DRUG SCREEN Drug Class       Cutoff (ng/mL) Amphetamine      1000 Barbiturate      200 Benzodiazepine   426 Tricyclics       834 Opiates          300 Cocaine          300 THC              50   Ethanol     Status: Abnormal   Collection Time: 09/24/14 12:05 PM  Result Value Ref Range   Alcohol, Ethyl (B) 126 (H) 0 - 9 mg/dL    Comment:        LOWEST DETECTABLE LIMIT FOR SERUM ALCOHOL IS 11 mg/dL FOR MEDICAL PURPOSES ONLY     Observation Level/Precautions:  15 minute checks  Laboratory:  Per ED  Psychotherapy:  Group sessions  Medications: See medication   Consultations:  As needed  Discharge Concerns:  Maintaining sobriety  Estimated LOS: 2-4 days  Other:     Psychological Evaluations: Yes   Treatment Plan Summary: Daily contact with patient to assess and evaluate symptoms and progress in treatment, Medication management and Treatment Plan/Recommendations: 1. Admit for crisis management and stabilization, estimated length of stay 3-5 days.  2. Medication management to reduce current symptoms to base line and improve the patient's overall level of functioning; Initiate Ativan protocol for alcohol detox due to elevated liver enzymes, Trazodone 50 mg q bedtime for sleep 3. Treat health problems as indicated.  4. Develop treatment plan to decrease risk of relapse upon discharge and the need for readmission.  5. Psycho-social education regarding relapse prevention and self care.  6. Health care follow up as needed for medical problems.  7. Review, reconcile, and  reinstate any pertinent home medications for other health issues where appropriate. 8. Call for consults with hospitalist for any additional specialty patient care services as needed.   Medical Decision Making:  New problem, with additional work up planned, Review of Psycho-Social Stressors (1), Review or order clinical lab tests (1), Review and summation of old records (2), Review of Medication Regimen & Side Effects (2) and Review of New Medication or Change in Dosage (2)  I certify that inpatient services  furnished can reasonably be expected to improve the patient's condition.   Lindell Spar I, PMHNP-BC 1/26/20165:56 PM I personally assessed the patient, reviewed the physical exam and labs and formulated the treatment plan Geralyn Flash A. Sabra Heck, M.D.

## 2014-09-24 NOTE — ED Notes (Signed)
Pt on phone at nurses' desk. 

## 2014-09-24 NOTE — BHH Counselor (Signed)
Adult Comprehensive Assessment  Patient ID: Chad Mclaughlin, male   DOB: 04/11/1981, 34 y.o.   MRN: [id number]  Information Source: Information source: Patient  Current Stressors:  Educational / Learning stressors: None Employment / Job issues: Patient believes he has been terminated from is job.   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 Housing / Lack of housing: None at this time, but if wife leave, patient reports he will not be able to maintain on his own Physical health (include injuries & life threatening diseases): None Social relationships: none Substance abuse: Patient reports drinking a case of beer daily  Living/Environment/Situation:  Living Arrangements: Spouse/significant other Living conditions (as described by patient or guardian): Okay How long has patient lived in current situation?: Two and a half years What is atmosphere in current home: Comfortable  Family History:  Marital status: Married Number of Years Married: 2 What types of issues is patient dealing with in the relationship?: Wife is planning to leave the home due to patient's alcohol problems Does patient have children?: No  Childhood History:  By whom was/is the patient raised?: Mother/father and step-parent Additional childhood history information: Patient reports having a good childhood Description of patient's relationship with caregiver when they were a child: Very good Patient's description of current relationship with people who raised him/her: Very good Does patient have siblings?: No Did patient suffer any verbal/emotional/physical/sexual abuse as a child?: No Did patient suffer from severe childhood neglect?: No Has patient ever been sexually abused/assaulted/raped as an adolescent or adult?: No Was the patient ever a victim of a crime or a disaster?: No Witnessed domestic violence?:  No Has patient been effected by domestic violence as an adult?: No  Education:  Highest grade of school patient has completed: Masters in [place] Currently a Ship broker?: No Learning disability?: No  Employment/Work Situation:   Employment situation: Employed Where is patient currently employed?: [employer] long has patient been employed?: Five years Patient's job has been impacted by current illness: No What is the longest time patient has a held a job?: Five years Where was the patient employed at that time?: [employer] Has patient ever been in the [employer]?: No Has patient ever served in combat?: No  Financial Resources:   Financial resources: Income from employment Does patient have a representative payee or guardian?: No  Alcohol/Substance Abuse:   What has been your use of drugs/alcohol within the last 12 months?: Patient reports drinking a case of beer daily after relapsing a week ago following discharge from [facility]stance Abuse Treatment Hx: Past Tx, Inpatient If yes, describe treatment: [facility] - December 2015- January 2016 Has alcohol/substance abuse ever caused legal problems?: No  Social Support System:   Heritage manager System: Poor Describe Community Support System: AA Type of faith/religion: None How does patient's faith help to cope with current illness?: [name]  Leisure/Recreation:   Leisure and Hobbies: Sports and Music  Strengths/Needs:   What things does the patient do well?: Good work history In what areas does patient struggle / problems for patient: Alcohol dependence  Discharge Plan:   Does patient have access to transportation?: Yes Will patient be returning to same living situation after discharge?: Yes Currently receiving community mental health services:  (Patient was in [facility]'s IOP) If no, would patient like referral for services when discharged?: Yes (What county?) (Patient  would like to be referred to [facility]  CD-IOP if unable to continue with [facility]'s program) Does patient have financial barriers related to discharge medications?: No  Summary/Recommendations:  [name] is a 34 years old Caucasian male admitted with Substance Induced Mood Disorder and Alcohol Abuse Disorder.  He will benefit from crisis stabilization, evaluation for medication, psycho-education groups for coping skills development, group therapy and case management for discharge planning.     Caidin Heidenreich, Eulas Post. 09/24/2014

## 2014-09-24 NOTE — ED Notes (Signed)
Patient moved to pod c, gave report to annette, secured valuables with security, see belongings assessment.

## 2014-09-24 NOTE — ED Notes (Signed)
[place] transportation called  

## 2014-09-24 NOTE — Clinical Social Work Note (Signed)
CSW met with patient to complete PSA.  He advised of wanting to return to [facility] for their IOP.  Call to [place] who advised she will follow up with director and call CSW back

## 2014-09-24 NOTE — ED Provider Notes (Signed)
CSN: [id number]     Arrival date & time 09/23/14  2208 History  This chart was scribed for Chad Rice, MD by Rayfield Citizen, ED Scribe. This patient was seen in room D31C/D31C and the patient's care was started at 12:47 AM.    Chief Complaint  Patient presents with  . Alcohol Intoxication  . Emesis   Patient is a 34 y.o. male presenting with intoxication and vomiting. The history is provided by the patient. No language interpreter was used.  Alcohol Intoxication Pertinent negatives include no chest pain, no abdominal pain, no headaches and no shortness of breath.  Emesis Associated symptoms: no abdominal pain, no chills, no diarrhea and no headaches     HPI Comments: Chad Mclaughlin is a 34 y.o. male who presents to the Emergency Department complaining of excessive EtOH consumption (15-18 beers per day) beginning 09/18/14 after leaving the ED. Patient reports his last drink was today at 13:00. He is currently experiencing "withdrawal" symptoms, including cramping and shaking in his legs and visual hallucinations. He also reports vomiting (4x today). He has a long-term history of EtOH abuse; he has previously experienced withdrawal symptoms. He believes he has prior experience with seizures related to withdrawal.  He explains that he was released from [facility] 10 days PTA; he is amenable to being admitted to an inpatient treatment at this time.   He used cocaine three days PTA; he denies any other illicit drug use. He denies SI or HI.   Past Medical History  Diagnosis Date  . Anxiety   . Bipolar affective disorder   . Alcohol abuse   . Depression    History reviewed. No pertinent past surgical history. History reviewed. No pertinent family history. History  Substance Use Topics  . Smoking status: Never Smoker   . Smokeless tobacco: Not on file  . Alcohol Use: 7.2 oz/week    12 Cans of beer per week     Comment: daily     Review of Systems  Constitutional: Negative for  fever and chills.  Respiratory: Negative for shortness of breath.   Cardiovascular: Negative for chest pain.  Gastrointestinal: Positive for nausea and vomiting. Negative for abdominal pain, diarrhea and constipation.  Musculoskeletal: Negative for back pain, neck pain and neck stiffness.  Skin: Negative for rash.  Neurological: Positive for tremors. Negative for dizziness, weakness, light-headedness, numbness and headaches.  Psychiatric/Behavioral: Positive for hallucinations. Negative for suicidal ideas.  All other systems reviewed and are negative.   Allergies  Shellfish allergy  Home Medications   Prior to Admission medications   Medication Sig Start Date End Date Taking? Authorizing Provider  lamoTRIgine (LAMICTAL) 25 MG tablet Take 2 tablets (50 mg total) by mouth 2 (two) times daily. For mood stabilization 08/16/14  Yes Encarnacion Slates, NP  PARoxetine (PAXIL) 40 MG tablet Take 1 tablet (40 mg total) by mouth daily. For depression 08/16/14  Yes Encarnacion Slates, NP  QUEtiapine (SEROQUEL) 100 MG tablet Take 100 mg by mouth at bedtime.   Yes Historical Provider, MD  hydrOXYzine (ATARAX/VISTARIL) 25 MG tablet Take 1 tablet (25 mg total) by mouth every 6 (six) hours as needed for anxiety. Patient not taking: Reported on 09/24/2014 08/16/14   Encarnacion Slates, NP  Multiple Vitamin (MULTIVITAMIN WITH MINERALS) TABS tablet Take 1 tablet by mouth daily. May purchase from over the counter at yr local pharmacy:  Vitamin supplement Patient not taking: Reported on 09/24/2014 08/16/14   Encarnacion Slates, NP  traZODone (  DESYREL) 25 mg TABS tablet Take 1.5 tablets (75 mg total) by mouth at bedtime as needed for sleep. Patient not taking: Reported on 09/24/2014 08/16/14   Encarnacion Slates, NP   BP 116/78 mmHg  Pulse 106  Temp(Src) 98.4 F (36.9 C) (Oral)  Resp 18  Ht 5\' 4"  (1.626 m)  Wt 148 lb (67.132 kg)  BMI 25.39 kg/m2  SpO2 95% Physical Exam  Constitutional: He is oriented to person, place, and time.  He appears well-developed and well-nourished. No distress.  HENT:  Head: Normocephalic and atraumatic.  Mouth/Throat: Oropharynx is clear and moist.  Eyes: EOM are normal. Pupils are equal, round, and reactive to light.  Neck: Normal range of motion. Neck supple.  Cardiovascular: Normal rate and regular rhythm.   Pulmonary/Chest: Effort normal and breath sounds normal. No respiratory distress. He has no wheezes. He has no rales.  Abdominal: Soft. Bowel sounds are normal. He exhibits no distension and no mass. There is no tenderness. There is no rebound and no guarding.  Musculoskeletal: Normal range of motion. He exhibits no edema or tenderness.  Neurological: He is alert and oriented to person, place, and time.  Tremulous. 5/5 motor in all extremities. Sensation is grossly intact.  Skin: Skin is warm and dry. No rash noted. No erythema.  Psychiatric: He has a normal mood and affect. His behavior is normal.  Nursing note and vitals reviewed.   ED Course  Procedures   DIAGNOSTIC STUDIES: Oxygen Saturation is 98% on RA, normal by my interpretation.    COORDINATION OF CARE: 12:53 AM Discussed treatment plan with pt at bedside and pt agreed to plan.   Labs Review Labs Reviewed  CBC WITH DIFFERENTIAL/PLATELET - Abnormal; Notable for the following:    MCHC 36.9 (*)    All other components within normal limits  COMPREHENSIVE METABOLIC PANEL - Abnormal; Notable for the following:    Glucose, Bld 131 (*)    AST 73 (*)    ALT 65 (*)    All other components within normal limits  ETHANOL - Abnormal; Notable for the following:    Alcohol, Ethyl (B) 399 (*)    All other components within normal limits  ETHANOL - Abnormal; Notable for the following:    Alcohol, Ethyl (B) 126 (*)    All other components within normal limits  URINE RAPID DRUG SCREEN (HOSP PERFORMED)    Imaging Review No results found.   EKG Interpretation None      MDM   Final diagnoses:  Alcohol dependence  with uncomplicated withdrawal   I personally performed the services described in this documentation, which was scribed in my presence. The recorded information has been reviewed and considered.  Patient given several liters IV fluids, Ativan and Zofran. Alcohol level close to 400. Will consult TTS for eval for detox.   Chad Rice, MD 09/25/14 [phone]

## 2014-09-25 ENCOUNTER — Other Ambulatory Visit (HOSPITAL_COMMUNITY): Payer: Commercial Managed Care - PPO

## 2014-09-25 MED ORDER — LAMOTRIGINE 25 MG PO TABS
50.0000 mg | ORAL_TABLET | Freq: Two times a day (BID) | ORAL | Status: DC
Start: 2014-09-25 — End: 2014-09-26
  Administered 2014-09-25 – 2014-09-26 (×3): 50 mg via ORAL
  Filled 2014-09-25 (×2): qty 56
  Filled 2014-09-25 (×2): qty 2
  Filled 2014-09-25 (×2): qty 56
  Filled 2014-09-25 (×4): qty 2

## 2014-09-25 MED ORDER — HYDROXYZINE HCL 50 MG PO TABS
50.0000 mg | ORAL_TABLET | Freq: Three times a day (TID) | ORAL | Status: DC | PRN
  Administered 2014-09-25: 50 mg via ORAL
  Filled 2014-09-25: qty 20
  Filled 2014-09-25: qty 1

## 2014-09-25 MED ORDER — PAROXETINE HCL 20 MG PO TABS: 40.0000 mg | ORAL_TABLET | Freq: Every day | ORAL | Status: DC

## 2014-09-25 MED ORDER — PAROXETINE HCL 20 MG PO TABS
60.0000 mg | ORAL_TABLET | Freq: Every day | ORAL | Status: DC
  Administered 2014-09-25 – 2014-09-26 (×2): 60 mg via ORAL
  Filled 2014-09-25 (×2): qty 3
  Filled 2014-09-25 (×2): qty 42
  Filled 2014-09-25 (×2): qty 3

## 2014-09-25 MED ORDER — QUETIAPINE FUMARATE 100 MG PO TABS
100.0000 mg | ORAL_TABLET | Freq: Every day | ORAL | Status: DC
  Administered 2014-09-25: 100 mg via ORAL
  Filled 2014-09-25 (×2): qty 14
  Filled 2014-09-25 (×2): qty 1

## 2014-09-25 NOTE — Progress Notes (Signed)
Patient ID: Chad Mclaughlin, male   DOB: 08/11/1981, 34 y.o.   MRN: [id number]  DAR: Pt. Denies SI/HI and A/V Hallucinations to this Probation officer. Patient reports mild withdrawal symptoms most notably anxiety and tremors. Patient does not report any pain or discomfort at this time. Support and encouragement provided to the patient to come to writer with questions or concerns. Scheduled medications administered to patient per physician's orders. Patient is receptive and cooperative with Probation officer but minimal interaction. Patient is seen in the milieu and is attending some groups. Q15 minute checks are maintained for safety.

## 2014-09-25 NOTE — Progress Notes (Signed)
[facility] MD Progress Note  09/25/2014 4:12 PM Chad Mclaughlin  MRN:  [id number] Subjective:  Chad Mclaughlin states that he did well when he was at [facility] and shortly there after. States that after he left [facility], his wife moved out and he lost his job. States that these two events triggered the relapse. He is wanting to pursue a CD IOP. States he is not sure why he is having so much withdrawal this time around. He is having tremors, sweats an dhis BP and pulse are on the higher end. While at [facility] he was kept on the Lamictal and Paxil. ( He asked to be considered for an increase in the Paxil as feels his anxiety, agitation is out of control Principal Problem: Alcohol dependence Diagnosis:   Patient Active Problem List   Diagnosis Date Noted  . GAD (generalized anxiety disorder) [F41.1] 06/24/2014    Priority: High  . Alcohol dependence [F10.20] 06/22/2014    Priority: High  . Alcohol dependence with uncomplicated withdrawal [C62.376]   . Substance induced mood disorder [F19.94] 08/13/2014  . Alcohol withdrawal [F10.239] 05/22/2014  . Other pancytopenia [D61.818] 05/22/2014  . Hypokalemia [E87.6] 05/22/2014  . Alcoholic ketoacidosis [E83.1] 05/20/2014  . Nausea and vomiting [R11.2] 05/20/2014   Total Time spent with patient: 30 minutes   Past Medical History:  Past Medical History  Diagnosis Date  . Anxiety   . Bipolar affective disorder   . Alcohol abuse   . Depression    History reviewed. No pertinent past surgical history. Family History: History reviewed. No pertinent family history. Social History:  History  Alcohol Use  . 7.2 oz/week  . 12 Cans of beer per week    Comment: daily      History  Drug Use No    History   Social History  . Marital Status: Married    Spouse Name: [name]    Number of Children: [name]  . Years of Education: [name]   Social History Main Topics  . Smoking status: Never Smoker   . Smokeless tobacco: None  . Alcohol Use: 7.2 oz/week    12  Cans of beer per week     Comment: daily   . Drug Use: No  . Sexual Activity: No   Other Topics Concern  . None   Social History Narrative   Additional History:    Sleep: Poor  Appetite:  Poor   Assessment:   Musculoskeletal: Strength & Muscle Tone: within normal limits Gait & Station: normal Patient leans: [name]   Psychiatric Specialty Exam: Physical Exam  Review of Systems  Constitutional: Positive for malaise/fatigue and diaphoresis.  HENT: Negative.   Eyes: Negative.   Respiratory: Negative.   Cardiovascular: Negative.   Gastrointestinal: Negative.   Genitourinary: Negative.   Musculoskeletal: Negative.   Skin: Negative.   Neurological: Positive for tremors.  Endo/Heme/Allergies: Negative.   Psychiatric/Behavioral: Positive for substance abuse. The patient is nervous/anxious and has insomnia.     Blood pressure 128/95, pulse 98, temperature 98 F (36.7 C), temperature source Oral, resp. rate 15, height 5' 4.25" (1.632 m), weight 68.38 kg (150 lb 12 oz).Body mass index is 25.67 kg/(m^2).  General Appearance: Disheveled  Eye Sport and exercise psychologist::  Fair  Speech:  Clear and Coherent  Volume:  fluctuates  Mood:  Anxious, Depressed and worried  Affect:  Tearful and anxious depressed worried  Thought Process:  Coherent and Goal Directed  Orientation:  Full (Time, Place, and Person)  Thought Content:  symptoms events worries concerns  Suicidal Thoughts:  No  Homicidal Thoughts:  No  Memory:  Immediate;   Fair Recent;   Fair Remote;   Fair  Judgement:  Fair  Insight:  Present  Psychomotor Activity:  Restlessness  Concentration:  Fair  Recall:  [employer] of Knowledge:Fair  Language: Fair  Akathisia:  No  Handed:  Right  AIMS (if indicated):     Assets:  Desire for Improvement Vocational/Educational  ADL's:  Intact  Cognition: WNL  Sleep:        Current Medications: Current Facility-Administered Medications  Medication Dose Route Frequency Provider Last Rate  Last Dose  . acetaminophen (TYLENOL) tablet 650 mg  650 mg Oral Q6H PRN Encarnacion Slates, NP      . alum & mag hydroxide-simeth (MAALOX/MYLANTA) 200-200-20 MG/5ML suspension 30 mL  30 mL Oral Q4H PRN Encarnacion Slates, NP      . hydrOXYzine (ATARAX/VISTARIL) tablet 25 mg  25 mg Oral Q6H PRN Encarnacion Slates, NP   25 mg at 09/24/14 2127  . lamoTRIgine (LAMICTAL) tablet 50 mg  50 mg Oral BID Nicholaus Bloom, MD   50 mg at 09/25/14 1136  . loperamide (IMODIUM) capsule 2-4 mg  2-4 mg Oral PRN Encarnacion Slates, NP      . LORazepam (ATIVAN) tablet 1 mg  1 mg Oral Q6H PRN Encarnacion Slates, NP   1 mg at 09/24/14 1538  . LORazepam (ATIVAN) tablet 1 mg  1 mg Oral QID Encarnacion Slates, NP   1 mg at 09/25/14 1137   Followed by  . [START ON 09/26/2014] LORazepam (ATIVAN) tablet 1 mg  1 mg Oral TID Encarnacion Slates, NP       Followed by  . [START ON 09/27/2014] LORazepam (ATIVAN) tablet 1 mg  1 mg Oral BID Encarnacion Slates, NP       Followed by  . [START ON 09/28/2014] LORazepam (ATIVAN) tablet 1 mg  1 mg Oral Daily Encarnacion Slates, NP      . magnesium hydroxide (MILK OF MAGNESIA) suspension 30 mL  30 mL Oral Daily PRN Encarnacion Slates, NP      . multivitamin with minerals tablet 1 tablet  1 tablet Oral Daily Encarnacion Slates, NP   1 tablet at 09/25/14 0818  . nicotine (NICODERM CQ - dosed in mg/24 hours) patch 21 mg  21 mg Transdermal Q0600 Encarnacion Slates, NP   21 mg at 09/25/14 0818  . ondansetron (ZOFRAN-ODT) disintegrating tablet 4 mg  4 mg Oral Q6H PRN Encarnacion Slates, NP   4 mg at 09/24/14 1538  . PARoxetine (PAXIL) tablet 60 mg  60 mg Oral Daily Nicholaus Bloom, MD   60 mg at 09/25/14 1137  . QUEtiapine (SEROQUEL) tablet 100 mg  100 mg Oral QHS Nicholaus Bloom, MD      . thiamine (B-1) injection 100 mg  100 mg Intramuscular Once Encarnacion Slates, NP   100 mg at 09/24/14 1515  . thiamine (VITAMIN B-1) tablet 100 mg  100 mg Oral Daily Encarnacion Slates, NP   100 mg at 09/25/14 8295    Lab Results:  Results for orders placed or performed during the  hospital encounter of 09/24/14 (from the past 48 hour(s))  CBC with Differential     Status: Abnormal   Collection Time: 09/23/14 10:30 PM  Result Value Ref Range   WBC 5.1 4.0 - 10.5 K/uL   RBC 5.16 4.22 -  5.81 MIL/uL   Hemoglobin 16.4 13.0 - 17.0 g/dL   HCT 44.5 39.0 - 52.0 %   MCV 86.2 78.0 - 100.0 fL   MCH 31.8 26.0 - 34.0 pg   MCHC 36.9 (H) 30.0 - 36.0 g/dL   RDW 12.1 11.5 - 15.5 %   Platelets 206 150 - 400 K/uL   Neutrophils Relative % 73 43 - 77 %   Neutro Abs 3.7 1.7 - 7.7 K/uL   Lymphocytes Relative 23 12 - 46 %   Lymphs Abs 1.2 0.7 - 4.0 K/uL   Monocytes Relative 4 3 - 12 %   Monocytes Absolute 0.2 0.1 - 1.0 K/uL   Eosinophils Relative 0 0 - 5 %   Eosinophils Absolute 0.0 0.0 - 0.7 K/uL   Basophils Relative 1 0 - 1 %   Basophils Absolute 0.0 0.0 - 0.1 K/uL  Comprehensive metabolic panel     Status: Abnormal   Collection Time: 09/23/14 10:30 PM  Result Value Ref Range   Sodium 138 135 - 145 mmol/L   Potassium 4.1 3.5 - 5.1 mmol/L   Chloride 100 96 - 112 mmol/L   CO2 28 19 - 32 mmol/L   Glucose, Bld 131 (H) 70 - 99 mg/dL   BUN 7 6 - 23 mg/dL   Creatinine, Ser 0.94 0.50 - 1.35 mg/dL   Calcium 8.5 8.4 - 10.5 mg/dL   Total Protein 6.9 6.0 - 8.3 g/dL   Albumin 4.1 3.5 - 5.2 g/dL   AST 73 (H) 0 - 37 U/L   ALT 65 (H) 0 - 53 U/L   Alkaline Phosphatase 51 39 - 117 U/L   Total Bilirubin 1.1 0.3 - 1.2 mg/dL   GFR calc non Af Amer >90 >90 mL/min   GFR calc Af Amer >90 >90 mL/min    Comment: (NOTE) The eGFR has been calculated using the CKD EPI equation. This calculation has not been validated in all clinical situations. eGFR's persistently <90 mL/min signify possible Chronic Kidney Disease.    Anion gap 10 5 - 15  Ethanol     Status: Abnormal   Collection Time: 09/24/14  1:20 AM  Result Value Ref Range   Alcohol, Ethyl (B) 399 (H) 0 - 9 mg/dL    Comment:        LOWEST DETECTABLE LIMIT FOR SERUM ALCOHOL IS 11 mg/dL FOR MEDICAL PURPOSES ONLY   Urine rapid drug  screen (hosp performed)     Status: None   Collection Time: 09/24/14  2:14 AM  Result Value Ref Range   Opiates NONE DETECTED NONE DETECTED   Cocaine NONE DETECTED NONE DETECTED   Benzodiazepines NONE DETECTED NONE DETECTED   Amphetamines NONE DETECTED NONE DETECTED   Tetrahydrocannabinol NONE DETECTED NONE DETECTED   Barbiturates NONE DETECTED NONE DETECTED    Comment:        DRUG SCREEN FOR MEDICAL PURPOSES ONLY.  IF CONFIRMATION IS NEEDED FOR ANY PURPOSE, NOTIFY LAB WITHIN 5 DAYS.        LOWEST DETECTABLE LIMITS FOR URINE DRUG SCREEN Drug Class       Cutoff (ng/mL) Amphetamine      1000 Barbiturate      200 Benzodiazepine   800 Tricyclics       349 Opiates          300 Cocaine          300 THC              50   Ethanol  Status: Abnormal   Collection Time: 09/24/14 12:05 PM  Result Value Ref Range   Alcohol, Ethyl (B) 126 (H) 0 - 9 mg/dL    Comment:        LOWEST DETECTABLE LIMIT FOR SERUM ALCOHOL IS 11 mg/dL FOR MEDICAL PURPOSES ONLY     Physical Findings: AIMS: Facial and Oral Movements Muscles of Facial Expression: None, normal Lips and Perioral Area: None, normal Jaw: None, normal Tongue: None, normal,Extremity Movements Upper (arms, wrists, hands, fingers): None, normal Lower (legs, knees, ankles, toes): None, normal, Trunk Movements Neck, shoulders, hips: None, normal, Overall Severity Severity of abnormal movements (highest score from questions above): None, normal Incapacitation due to abnormal movements: None, normal Patient's awareness of abnormal movements (rate only patient's report): No Awareness, Dental Status Current problems with teeth and/or dentures?: No Does patient usually wear dentures?: No  CIWA:  CIWA-Ar Total: 6 COWS:     Treatment Plan Summary: Daily contact with patient to assess and evaluate symptoms and progress in treatment and Medication management Supportive approach/coping skills/relapse prevention Alcohol  Dependence/withdrawal; pursue the ativan detox protocol Anxiety: will increase the Paxil to 60 mg daily Mood Instability; will pursue the Lamictal at 50 mg BID Explore CD IOP options  Medical Decision Making:  Review of Psycho-Social Stressors (1), Review of Medication Regimen & Side Effects (2) and Review of New Medication or Change in Dosage (2)     Raistlin Gum A 09/25/2014, 4:12 PM

## 2014-09-25 NOTE — BHH Suicide Risk Assessment (Signed)
[facility] Admission Suicide Risk Assessment   Nursing information obtained from:  Patient Demographic factors:  Male, Divorced or widowed, Caucasian, Living alone Current Mental Status:  NA Loss Factors:  Loss of significant relationship (Getting divorced from wife) Historical Factors:  Family history of mental illness or substance abuse Risk Reduction Factors:  Sense of responsibility to family, Religious beliefs about death, Positive social support Total Time spent with patient: 30 minutes Principal Problem: <principal problem not specified> Diagnosis:   Patient Active Problem List   Diagnosis Date Noted  . GAD (generalized anxiety disorder) [F41.1] 06/24/2014    Priority: High  . Alcohol dependence [F10.20] 06/22/2014    Priority: High  . Alcohol dependence with uncomplicated withdrawal [E75.170]   . Substance induced mood disorder [F19.94] 08/13/2014  . Alcohol withdrawal [F10.239] 05/22/2014  . Other pancytopenia [D61.818] 05/22/2014  . Hypokalemia [E87.6] 05/22/2014  . Alcoholic ketoacidosis [Y17.4] 05/20/2014  . Nausea and vomiting [R11.2] 05/20/2014     Continued Clinical Symptoms:  Alcohol Use Disorder Identification Test Final Score (AUDIT): 17 The "Alcohol Use Disorders Identification Test", Guidelines for Use in Primary Care, Second Edition.  World Pharmacologist [facility]). Score between 0-7:  no or low risk or alcohol related problems. Score between 8-15:  moderate risk of alcohol related problems. Score between 16-19:  high risk of alcohol related problems. Score 20 or above:  warrants further diagnostic evaluation for alcohol dependence and treatment.   CLINICAL FACTORS:   Severe Anxiety and/or Agitation Alcohol/Substance Abuse/Dependencies   Musculoskeletal: Strength & Muscle Tone: within normal limits Gait & Station: normal Patient leans: [name]  Psychiatric Specialty Exam: Physical Exam  Review of Systems  Constitutional: Positive for diaphoresis.  HENT:  Negative.   Eyes: Negative.   Respiratory: Negative.   Cardiovascular: Negative.   Gastrointestinal: Negative.   Genitourinary: Negative.   Musculoskeletal: Negative.   Skin: Negative.   Neurological: Positive for tremors.  Endo/Heme/Allergies: Negative.   Psychiatric/Behavioral: Positive for depression and substance abuse. The patient is nervous/anxious.     Blood pressure 128/95, pulse 98, temperature 98 F (36.7 C), temperature source Oral, resp. rate 15, height 5' 4.25" (1.632 m), weight 68.38 kg (150 lb 12 oz).Body mass index is 25.67 kg/(m^2).  General Appearance: Disheveled  Eye Sport and exercise psychologist::  Fair  Speech:  Clear and Coherent  Volume:  Decreased  Mood:  Anxious and Depressed  Affect:  Labile and Tearful  Thought Process:  Coherent and Goal Directed  Orientation:  Full (Time, Place, and Person)  Thought Content:  symtpoms events worries concerns  Suicidal Thoughts:  No  Homicidal Thoughts:  No  Memory:  Immediate;   Fair Recent;   Fair Remote;   Fair  Judgement:  Fair  Insight:  Present  Psychomotor Activity:  Restlessness and Tremor  Concentration:  Fair  Recall:  [employer] of Knowledge:Fair  Language: Fair  Akathisia:  No  Handed:  Right  AIMS (if indicated):     Assets:  Desire for Improvement Vocational/Educational  Sleep:     Cognition: WNL  ADL's:  Intact     COGNITIVE FEATURES THAT CONTRIBUTE TO RISK:  Closed-mindedness, Polarized thinking and Thought constriction (tunnel vision)    SUICIDE RISK:   Mild:   PLAN OF CARE: Supportive approach/coping skills/relapse prevention                               Alcohol Dependence/withdrawal: Ativan detox protocol ( evidencing tremors, diaphoresis, agitation)  Depression/mood instability: reassess and optimize response to his medications Medical Decision Making:  Review of Psycho-Social Stressors (1), Review or order clinical lab tests (1), Review of Medication Regimen & Side Effects  (2) and Review of New Medication or Change in Dosage (2)  I certify that inpatient services furnished can reasonably be expected to improve the patient's condition.   Maggy Wyble A 09/25/2014, 4:02 PM

## 2014-09-25 NOTE — Plan of Care (Signed)
Problem: Alteration in mood & ability to function due to Goal: LTG-Pt reports reduction in suicidal thoughts (Patient reports reduction in suicidal thoughts and is able to verbalize a safety plan for whenever patient is feeling suicidal)  Outcome: Progressing Pt denies SI at this time Goal: LTG-Pt verbalizes understanding of importance of med regimen (Patient verbalizes understanding of importance of medication regimen and need to continue outpatient care and support groups)  Outcome: Progressing Pt stated he will take medications as prescribed Goal: LTG-Pt is able to verbalize triggers for his/her abuse (Patient is able to verbalize triggers for his/her abuse and strategies to maintain sobriety)  Outcome: Progressing Pt stated he will look into why he drinks and hopes to get to the bottom of his desires

## 2014-09-25 NOTE — Progress Notes (Signed)
D: Pt denies SI/HI/AVH. Pt is pleasant and cooperative. Pt sad and tearful upon approach. Pt concerned that his wife is leaving, he lost his job and will probably lose his car. Pt stated he really wants to quit drinking. Pt seems sincere with his attitude and appears to really want to quit.   A: Pt was offered support and encouragement. Pt was given scheduled medications. Pt was encourage to attend groups. Q 15 minute checks were done for safety.   R:Pt attends groups and interacts well with peers and staff. Pt is taking medication. Pt receptive to treatment and safety maintained on unit.

## 2014-09-25 NOTE — BHH Group Notes (Signed)
[facility] LCSW Aftercare Discharge Planning Group Note   09/25/2014 10:21 AM       Participation Quality:  Appropraite  Mood/Affect:  Appropriate  Depression Rating:  4  Anxiety Rating:  7  Thoughts of Suicide:  No  Will you contract for safety?   NA  Current AVH:  No  Plan for Discharge/Comments:  Patient attended discharge planning group and actively participated in group. Patient reports feeling much better today.  He plans to follow up with [facility] for outpatient services.  Suicide prevention education reviewed and SPE document provided.   Transportation Means: Patient has transportation.   Supports:  Patient has a support system.   Doil Kamara, Eulas Post

## 2014-09-25 NOTE — BHH Suicide Risk Assessment (Signed)
[facility] INPATIENT:  Family/Significant Other Suicide Prevention Education  Suicide Prevention Education:  Patient Refusal for Family/Significant Other Suicide Prevention Education: The patient Chad Mclaughlin has refused to provide written consent for family/significant other to be provided Family/Significant Other Suicide Prevention Education during admission and/or prior to discharge.  Physician notified.  Concha Pyo 09/25/2014, 12:48 PM

## 2014-09-25 NOTE — Plan of Care (Signed)
Problem: Ineffective individual coping Goal: STG: Patient will remain free from self harm Outcome: Progressing Pt remains safe on the unit

## 2014-09-25 NOTE — Progress Notes (Signed)
Adult Psychoeducational Group Note  Date:  09/25/2014 Time:  9:57 PM  Group Topic/Focus:  Narcotics Anonymous  Participation Level:  Active  Additional Comments:  Pt attended and participated in group but left halfway through group due to becoming overcome with emotions.  Milus Glazier 09/25/2014, 9:57 PM

## 2014-09-25 NOTE — BHH Group Notes (Signed)
[facility] LCSW Group Therapy  Emotional Regulation 1:15 - 2: 30 PM        09/25/2014     Type of Therapy:  Group Therapy  Participation Level:  Appropriate  Participation Quality:  Appropriate  Affect:  Depressed, Tearful  Cognitive:  Appropriate  Insight:  Developing/Improving   Engagement in Therapy:  Developing/Improving   Modes of Intervention:  Discussion Exploration Problem-Solving Supportive  Summary of Progress/Problems:  Group topic was emotional regulations.  Patient participated in the discussion and was able to identify an emotion that needed to regulated.  Patient very tearfully shared the emotion he has to regulate is the guilt and shame of discharging from a residential program and relapsing a few days later.  Patient shared his alcohol problems have led to the break up of his marriage.  He shared he day he relapsed, he found out he had been laid off from his job and his wife advised him she was separating from him.  Patient was able to identify approprite coping skills including staying sober one day at a time and getting a sponsor.  Concha Pyo 09/25/2014

## 2014-09-25 NOTE — Clinical Social Work Note (Signed)
CSW received a message from Juleen Starr at [facility] advising patient will not be able to follow up with their CD-IOP program.  Patient will be advised and other options explored.

## 2014-09-25 NOTE — Progress Notes (Signed)
D:  +ve VH, Pt denies SI/HI/AH. Pt is pleasant and cooperative. Pt observed interacting on unit, but appears very cautious. Pt plans to get bact to not drinking.    A: Pt was offered support and encouragement. Pt was given scheduled medications. Pt was encourage to attend groups. Q 15 minute checks were done for safety.   R:Pt attends groups and interacts well with peers and staff. Pt is taking medication. Pt has no complaints at this time .Pt receptive to treatment and safety maintained on unit.

## 2014-09-25 NOTE — Plan of Care (Signed)
Problem: Ineffective individual coping Goal: STG: Patient will participate in after care plan Outcome: Progressing Patient reports he has a few different options and his parents are helping him with finding after care appointments  as well.

## 2014-09-26 ENCOUNTER — Encounter (HOSPITAL_COMMUNITY): Payer: Self-pay | Admitting: Registered Nurse

## 2014-09-26 MED ORDER — PAROXETINE HCL 30 MG PO TABS: 60.0000 mg | ORAL_TABLET | Freq: Every day | ORAL | Status: DC

## 2014-09-26 MED ORDER — LAMOTRIGINE 25 MG PO TABS: 50.0000 mg | ORAL_TABLET | Freq: Two times a day (BID) | ORAL | Status: DC

## 2014-09-26 MED ORDER — QUETIAPINE FUMARATE 100 MG PO TABS: 100.0000 mg | ORAL_TABLET | Freq: Every day | ORAL | Status: DC

## 2014-09-26 MED ORDER — HYDROXYZINE HCL 25 MG PO TABS: 25.0000 mg | ORAL_TABLET | Freq: Four times a day (QID) | ORAL | Status: DC | PRN

## 2014-09-26 MED ORDER — LORAZEPAM 1 MG PO TABS: ORAL_TABLET | ORAL | Status: DC

## 2014-09-26 MED ORDER — HYDROXYZINE HCL 25 MG PO TABS
25.0000 mg | ORAL_TABLET | Freq: Four times a day (QID) | ORAL | Status: DC | PRN
  Filled 2014-09-26: qty 20

## 2014-09-26 NOTE — BHH Group Notes (Signed)
[facility] 0900 Nursing Group  The focus of this group is to educate the patient on the purpose and policies of crisis stabilization and provide a format to answer questions about their admission.  The group details unit policies and expectations of patients while admitted.  Patient came to the group and participated. Patient reported he felt like he was going today. Patient was cooperative and had no questions or concerns.

## 2014-09-26 NOTE — BHH Suicide Risk Assessment (Signed)
[facility] Discharge Suicide Risk Assessment   Demographic Factors:  Male and Caucasian  Total Time spent with patient: 30 minutes  Musculoskeletal: Strength & Muscle Tone: within normal limits Gait & Station: normal Patient leans: [name]  Psychiatric Specialty Exam: Physical Exam  Review of Systems  Constitutional: Negative.   HENT: Negative.   Eyes: Negative.   Respiratory: Negative.   Cardiovascular: Negative.   Gastrointestinal: Negative.   Genitourinary: Negative.   Musculoskeletal: Negative.   Skin: Negative.   Neurological: Negative.   Endo/Heme/Allergies: Negative.   Psychiatric/Behavioral: Positive for substance abuse. The patient is nervous/anxious.     Blood pressure 137/103, pulse 104, temperature 98.2 F (36.8 C), temperature source Oral, resp. rate 20, height 5' 4.25" (1.632 m), weight 68.38 kg (150 lb 12 oz).Body mass index is 25.67 kg/(m^2).  General Appearance: Fairly Groomed  Engineer, water::  Fair  Speech:  Clear and [id number]  Volume:  Normal  Mood:  Anxious  Affect:  Appropriate  Thought Process:  Coherent and Goal Directed  Orientation:  Full (Time, Place, and Person)  Thought Content:  plans as he moves on, relapse prevention plan  Suicidal Thoughts:  No  Homicidal Thoughts:  No  Memory:  Immediate;   Fair Recent;   Fair Remote;   Fair  Judgement:  Fair  Insight:  Present  Psychomotor Activity:  Normal  Concentration:  Fair  Recall:  [employer] [place]  Language: Fair  Akathisia:  No  Handed:  Right  AIMS (if indicated):     Assets:  Desire for Improvement Social Support Talents/Skills  Sleep:     Cognition: WNL  ADL's:  Intact   Have you used any form of tobacco in the last 30 days? (Cigarettes, Smokeless Tobacco, Cigars, and/or Pipes): No  Has this patient used any form of tobacco in the last 30 days? (Cigarettes, Smokeless Tobacco, Cigars, and/or Pipes) No  Mental Status Per Nursing Assessment::   On Admission:  NA  Current  Mental Status by Physician: In full contact with reality. There are no active SI plans or intent. He is wanting to go home today. He will be going to his house with his wife while she makes plan to leave. He states that he understands this is the right thing to do. States he accepted that they need to separate so they can focused on their own individual issues. He states she drinks also and it will be easier for him if he is not around alcohol. He will finish his detox at home. He is aware of his borderline HBP and he is monitoring it.    Loss Factors: Loss of significant relationship  Historical Factors: NA  Risk Reduction Factors:   Sense of responsibility to family, Living with another person, especially a relative and Positive social support  Continued Clinical Symptoms:  Alcohol/Substance Abuse/Dependencies  Cognitive Features That Contribute To Risk:  Polarized thinking and Thought constriction (tunnel vision)    Suicide Risk:  Minimal: No identifiable suicidal ideation.  Patients presenting with no risk factors but with morbid ruminations; may be classified as minimal risk based on the severity of the depressive symptoms  Principal Problem: Alcohol dependence Discharge Diagnoses:  Patient Active Problem List   Diagnosis Date Noted  . GAD (generalized anxiety disorder) [F41.1] 06/24/2014    Priority: High  . Alcohol dependence [F10.20] 06/22/2014    Priority: High  . Alcohol dependence with uncomplicated withdrawal [S28.315]   . Substance induced mood disorder [F19.94] 08/13/2014  . Alcohol  withdrawal [F10.239] 05/22/2014  . Other pancytopenia [D61.818] 05/22/2014  . Hypokalemia [E87.6] 05/22/2014  . Alcoholic ketoacidosis [B16.9] 05/20/2014  . Nausea and vomiting [R11.2] 05/20/2014    Follow-up Information    Follow up with [facility] On 09/27/2014.   Why:  You are scheduled with Trude Mcburney on Friday, September 27, 2014 at 11 AM.   Contact  information:   [address], [place]   [address]  [phone]      Plan Of Care/Follow-up recommendations:  Activity:  as tolerated Diet:  regular  Follow up with the [facility] the detox at home taking one Ativan 1 mg four times a day today and then decrease by one tab of 1 mg of Ativan every day until none left Is patient on multiple antipsychotic therapies at discharge:  No   Has Patient had three or more failed trials of antipsychotic monotherapy by history:  No  Recommended Plan for Multiple Antipsychotic Therapies: NA    Benno Brensinger A 09/26/2014, 1:10 PM

## 2014-09-26 NOTE — Discharge Summary (Signed)
Physician Discharge Summary Note  Patient:  Chad Mclaughlin is an 34 y.o., male MRN:  [id number] DOB:  Jun 30, 1981 Patient phone:  [phone] (home)  Patient address:   [address] [place] [address],  Total Time spent with patient: Greater than 30 minutes  Date of Admission:  09/24/2014 Date of Discharge: 09/26/2014  Reason for Admission:  Chad Mclaughlin is 34 years old. A caucasian male. He reports, "I just got out of the [facility] for alcohol abuse treatment on 09/13/14. I was doing fine for few days. I really did think that I could handle just a drink of alcohol. Once I picked up the drink, it was a total relapse. There is no good reason for my immediate relapse, however, knowing that I was losing my job & my marriage did not help it. My wife decided to separate from me because I was no longer the man she married. I was drinking 12 to a case of beer daily with shots of vodka. I was also feeling very sick. My head was stopped-up. My family encouraged me to come back to [facility] for detox. This is my 4th admission to this hospital. I was discharged from this hospital last December then went to the [facility]. I was at the [facility] a while ago for my alcoholism. I'm not SI/HI, just depressed, anxious & disappointed in my self. I have let everyone in my family down, again"   Principal Problem: Alcohol dependence Discharge Diagnoses: Patient Active Problem List   Diagnosis Date Noted  . Alcohol dependence with uncomplicated withdrawal [M35.361]   . Substance induced mood disorder [F19.94] 08/13/2014  . GAD (generalized anxiety disorder) [F41.1] 06/24/2014  . Alcohol dependence [F10.20] 06/22/2014  . Alcohol withdrawal [F10.239] 05/22/2014  . Other pancytopenia [D61.818] 05/22/2014  . Hypokalemia [E87.6] 05/22/2014  . Alcoholic ketoacidosis [W43.1] 05/20/2014  . Nausea and vomiting [R11.2] 05/20/2014    Musculoskeletal: Strength & Muscle Tone: within normal limits Gait &  Station: normal Patient leans: [name]  Psychiatric Specialty Exam:  See Suicide Risk Assessment  Physical Exam  Cardiovascular: Intact distal pulses.     Review of Systems  Psychiatric/Behavioral: Negative for suicidal ideas, hallucinations and memory loss. Depression: Stable. Nervous/anxious: Stable. Insomnia: Stable.     Blood pressure 123/92, pulse 94, temperature 98.2 F (36.8 C), temperature source Oral, resp. rate 20, height 5' 4.25" (1.632 m), weight 68.38 kg (150 lb 12 oz).Body mass index is 25.67 kg/(m^2).    Past Medical History:  Past Medical History  Diagnosis Date  . Anxiety   . Bipolar affective disorder   . Alcohol abuse   . Depression    History reviewed. No pertinent past surgical history. Family History: History reviewed. No pertinent family history. Social History:  History  Alcohol Use  . 7.2 oz/week  . 12 Cans of beer per week    Comment: daily      History  Drug Use No    History   Social History  . Marital Status: Married    Spouse Name: [name]    Number of Children: [name]  . Years of Education: [name]   Social History Main Topics  . Smoking status: Never Smoker   . Smokeless tobacco: None  . Alcohol Use: 7.2 oz/week    12 Cans of beer per week     Comment: daily   . Drug Use: No  . Sexual Activity: No   Other Topics Concern  . None   Social History Narrative  Past Psychiatric History: Hospitalizations: Yes  Outpatient Care: [facility]  Substance Abuse Care:  ETOH, TSH, Cocaine  Self-Mutilation: Denies  Suicidal Attempts: Denies  Violent Behaviors: Denies   Risk to Self: Is patient at risk for suicide?: No What has been your use of drugs/alcohol within the last 12 months?: Patient reports drinking a case of beer daily after relapsing a week ago following discharge from [facility] to Others:   Prior Inpatient Therapy:   Prior Outpatient Therapy:    Level of Care:  OP  Hospital Course:    ERCEL PEPITONE was admitted  for Alcohol dependence and crisis management.  He was treated with Ativan detox protocol for alcohol detox/withdrawal; Vistaril for anxiety; Lamictal for mood stabilization; Paxil for depression; and Seroquel for depression.  Medical problems were identified and treated as needed.  Home medications were restarted as appropriate.  Improvement was monitored by observation and Latanya Maudlin daily report of symptom reduction.  Emotional and mental status was monitored by daily self inventory reports completed by Latanya Maudlin and clinical staff.         TORETTO TINGLER was evaluated by the treatment team for stability and plans for continued recovery upon discharge.  He was offered further treatment options upon discharge including Residential, Intensive Outpatient and Outpatient treatment. He/She will follow up with [facility] 09/27/2014 for medication management and counseling.     Latanya Maudlin motivation was an integral factor for scheduling further treatment.  Employment, transportation, bed availability, health status, family support, and any pending legal issues were also considered during his hospital stay.  Upon completion of this admission the patient was both mentally and medically stable for discharge denying suicidal/homicidal ideation, auditory/visual/tactile hallucinations, delusional thoughts and paranoia.       Consults:  psychiatry  Significant Diagnostic Studies:  labs: ETOH, UDS, CBC/Diff, CMET, Urinalysis  Discharge Vitals:   Blood pressure 123/92, pulse 94, temperature 98.2 F (36.8 C), temperature source Oral, resp. rate 20, height 5' 4.25" (1.632 m), weight 68.38 kg (150 lb 12 oz). Body mass index is 25.67 kg/(m^2). Lab Results:   Results for orders placed or performed during the hospital encounter of 09/24/14 (from the past 72 hour(s))  CBC with Differential     Status: Abnormal   Collection Time: 09/23/14 10:30 PM  Result Value Ref Range   WBC  5.1 4.0 - 10.5 K/uL   RBC 5.16 4.22 - 5.81 MIL/uL   Hemoglobin 16.4 13.0 - 17.0 g/dL   HCT 44.5 39.0 - 52.0 %   MCV 86.2 78.0 - 100.0 fL   MCH 31.8 26.0 - 34.0 pg   MCHC 36.9 (H) 30.0 - 36.0 g/dL   RDW 12.1 11.5 - 15.5 %   Platelets 206 150 - 400 K/uL   Neutrophils Relative % 73 43 - 77 %   Neutro Abs 3.7 1.7 - 7.7 K/uL   Lymphocytes Relative 23 12 - 46 %   Lymphs Abs 1.2 0.7 - 4.0 K/uL   Monocytes Relative 4 3 - 12 %   Monocytes Absolute 0.2 0.1 - 1.0 K/uL   Eosinophils Relative 0 0 - 5 %   Eosinophils Absolute 0.0 0.0 - 0.7 K/uL   Basophils Relative 1 0 - 1 %   Basophils Absolute 0.0 0.0 - 0.1 K/uL  Comprehensive metabolic panel     Status: Abnormal   Collection Time: 09/23/14 10:30 PM  Result Value Ref Range   Sodium 138  135 - 145 mmol/L   Potassium 4.1 3.5 - 5.1 mmol/L   Chloride 100 96 - 112 mmol/L   CO2 28 19 - 32 mmol/L   Glucose, Bld 131 (H) 70 - 99 mg/dL   BUN 7 6 - 23 mg/dL   Creatinine, Ser 0.94 0.50 - 1.35 mg/dL   Calcium 8.5 8.4 - 10.5 mg/dL   Total Protein 6.9 6.0 - 8.3 g/dL   Albumin 4.1 3.5 - 5.2 g/dL   AST 73 (H) 0 - 37 U/L   ALT 65 (H) 0 - 53 U/L   Alkaline Phosphatase 51 39 - 117 U/L   Total Bilirubin 1.1 0.3 - 1.2 mg/dL   GFR calc non Af Amer >90 >90 mL/min   GFR calc Af Amer >90 >90 mL/min    Comment: (NOTE) The eGFR has been calculated using the CKD EPI equation. This calculation has not been validated in all clinical situations. eGFR's persistently <90 mL/min signify possible Chronic Kidney Disease.    Anion gap 10 5 - 15  Ethanol     Status: Abnormal   Collection Time: 09/24/14  1:20 AM  Result Value Ref Range   Alcohol, Ethyl (B) 399 (H) 0 - 9 mg/dL    Comment:        LOWEST DETECTABLE LIMIT FOR SERUM ALCOHOL IS 11 mg/dL FOR MEDICAL PURPOSES ONLY   Urine rapid drug screen (hosp performed)     Status: None   Collection Time: 09/24/14  2:14 AM  Result Value Ref Range   Opiates NONE DETECTED NONE DETECTED   Cocaine NONE DETECTED NONE  DETECTED   Benzodiazepines NONE DETECTED NONE DETECTED   Amphetamines NONE DETECTED NONE DETECTED   Tetrahydrocannabinol NONE DETECTED NONE DETECTED   Barbiturates NONE DETECTED NONE DETECTED    Comment:        DRUG SCREEN FOR MEDICAL PURPOSES ONLY.  IF CONFIRMATION IS NEEDED FOR ANY PURPOSE, NOTIFY LAB WITHIN 5 DAYS.        LOWEST DETECTABLE LIMITS FOR URINE DRUG SCREEN Drug Class       Cutoff (ng/mL) Amphetamine      1000 Barbiturate      200 Benzodiazepine   665 Tricyclics       993 Opiates          300 Cocaine          300 THC              50   Ethanol     Status: Abnormal   Collection Time: 09/24/14 12:05 PM  Result Value Ref Range   Alcohol, Ethyl (B) 126 (H) 0 - 9 mg/dL    Comment:        LOWEST DETECTABLE LIMIT FOR SERUM ALCOHOL IS 11 mg/dL FOR MEDICAL PURPOSES ONLY     Physical Findings: AIMS: Facial and Oral Movements Muscles of Facial Expression: None, normal Lips and Perioral Area: None, normal Jaw: None, normal Tongue: None, normal,Extremity Movements Upper (arms, wrists, hands, fingers): None, normal Lower (legs, knees, ankles, toes): None, normal, Trunk Movements Neck, shoulders, hips: None, normal, Overall Severity Severity of abnormal movements (highest score from questions above): None, normal Incapacitation due to abnormal movements: None, normal Patient's awareness of abnormal movements (rate only patient's report): No Awareness, Dental Status Current problems with teeth and/or dentures?: No Does patient usually wear dentures?: No  CIWA:  CIWA-Ar Total: 5 COWS:      See Psychiatric Specialty Exam and Suicide Risk Assessment completed by Attending Physician prior to  discharge.  Discharge destination:  Home  Is patient on multiple antipsychotic therapies at discharge:  No   Has Patient had three or more failed trials of antipsychotic monotherapy by history:  No    Recommended Plan for Multiple Antipsychotic Therapies: NA       Discharge Instructions    Discharge instructions    Complete by:  As directed   Take all of you medications as prescribed by your mental healthcare provider.  Report any adverse effects and reactions from your medications to your outpatient provider promptly. Do not engage in alcohol and or illegal drug use while on prescription medicines. In the event of worsening symptoms call the crisis hotline, 911, and or go to the nearest emergency department for appropriate evaluation and treatment of symptoms. Follow-up with your primary care provider for your medical issues, concerns and or health care needs.   Keep all scheduled appointments.  If you are unable to keep an appointment call to reschedule.  Let the nurse know if you will need medications before next scheduled appointment.            Medication List    STOP taking these medications        traZODone 25 mg Tabs tablet  Commonly known as:  DESYREL      TAKE these medications      Indication   hydrOXYzine 25 MG tablet  Commonly known as:  ATARAX/VISTARIL  Take 1 tablet (25 mg total) by mouth every 6 (six) hours as needed for anxiety.   Indication:  Tension, Anxiety     lamoTRIgine 25 MG tablet  Commonly known as:  LAMICTAL  Take 2 tablets (50 mg total) by mouth 2 (two) times daily. For mood stabilization   Indication:  Depression, Mood stabilization     LORazepam 1 MG tablet  Commonly known as:  ATIVAN  - For Alcohol withdrawal  - Day 1:  Take one tablet (1 mg) four times a day  - Day 2:  Take one tablet (1 mg) three times a day  - Day 3:  Take one tablet (1 mg) two times a day  - Day 4:  Take one table (1 mg) once.   Indication:  Acute Alcohol Withdrawal Syndrome     multivitamin with minerals Tabs tablet  Take 1 tablet by mouth daily. May purchase from over the counter at yr local pharmacy:  Vitamin supplement   Indication:  Vitamin Supplement     PARoxetine 30 MG tablet  Commonly known as:  PAXIL  Take 2  tablets (60 mg total) by mouth daily. For depression   Indication:  Major Depressive Disorder     QUEtiapine 100 MG tablet  Commonly known as:  SEROQUEL  Take 1 tablet (100 mg total) by mouth at bedtime. For mood stabilization        Follow-up Information    Follow up with [facility] On 09/27/2014.   Why:  You are scheduled with Trude Mcburney on Friday, September 27, 2014 at 11 AM.   Contact information:   [address], [place]   [address]  [phone]      Follow-up recommendations:  Activity:  As tolerated Diet:  As tolerated Other:  Follow up with [facility]  Comments:   Patient has been instructed to take medications as prescribed; and report adverse effects to outpatient provider.  Follow up with primary doctor for any medical issues and If symptoms recur report to  nearest emergency or crisis hot line.    Total Discharge Time: Greater than 30 minutes  Signed:  Earleen Newport, FNP-BC 09/26/2014, 11:01 AM  I personally assessed the patient and formulated the plan Geralyn Flash A. Sabra Heck, M.D.

## 2014-09-26 NOTE — Progress Notes (Signed)
Patient ID: Chad Mclaughlin, male   DOB: 02/15/1981, 34 y.o.   MRN: [id number]  Pt. Denies SI/HI and A/V hallucinations. Belongings returned to patient at time of discharge. Patient denies any pain or discomfort. Discharge instructions and medications were reviewed with patient. Patient verbalized understanding of both medications and discharge instructions. Patient was taking taxi home. Patienty discharged to taxi without distress. Q15 minute safety checks maintained until discharge.

## 2014-09-26 NOTE — Progress Notes (Addendum)
  [facility] Adult Case Management Discharge Plan :  Will you be returning to the same living situation after discharge:  Yes,  Patient is returning home with family. At discharge, do you have transportation home?: Yes,  Patient to arrange transportation home. Do you have the ability to pay for your medications: No.  Patient has insurance but limited income due to lack of income. He will needs assistance with indigent medications   Release of information consent forms completed and in the chart;  Patient's signature needed at discharge.  Patient to Follow up at: Follow-up Information    Follow up with [facility] On 09/30/2014.   Why:  You are scheduled with Trude Mcburney on Monday, September 30, 2014 at [employer] [employer] information:   [address], [place]   [address]  [place] appointment changed to Friday, September 27, 2014 at 11 AM.   Patient denies SI/HI:  Patient no longer endorsing SI/HI or other thoughts of self harm.  Safety Planning and Suicide Prevention discussed: .Reviewed with all patients during discharge planning group  Has patient been referred to the Quitline?: Patient is not a smoker.  Concha Pyo 09/26/2014, 10:24 AM

## 2014-09-27 ENCOUNTER — Other Ambulatory Visit (HOSPITAL_COMMUNITY): Payer: Commercial Managed Care - PPO

## 2014-09-30 ENCOUNTER — Other Ambulatory Visit (HOSPITAL_COMMUNITY): Payer: Commercial Managed Care - PPO

## 2014-09-30 ENCOUNTER — Encounter (HOSPITAL_COMMUNITY): Payer: Self-pay | Admitting: *Deleted

## 2014-09-30 ENCOUNTER — Emergency Department (HOSPITAL_COMMUNITY)
Admission: EM | Admit: 2014-09-30 | Discharge: 2014-09-30 | Discharge status: Against Medical Advice | Payer: Commercial Managed Care - PPO | Attending: Emergency Medicine | Admitting: Emergency Medicine

## 2014-09-30 DIAGNOSIS — F419 Anxiety disorder, unspecified: Secondary | ICD-10-CM | POA: Insufficient documentation

## 2014-09-30 DIAGNOSIS — R251 Tremor, unspecified: Secondary | ICD-10-CM | POA: Insufficient documentation

## 2014-09-30 DIAGNOSIS — Z87891 Personal history of nicotine dependence: Secondary | ICD-10-CM | POA: Diagnosis not present

## 2014-09-30 DIAGNOSIS — F10129 Alcohol abuse with intoxication, unspecified: Secondary | ICD-10-CM | POA: Diagnosis present

## 2014-09-30 DIAGNOSIS — Z79899 Other long term (current) drug therapy: Secondary | ICD-10-CM | POA: Diagnosis not present

## 2014-09-30 DIAGNOSIS — F3131 Bipolar disorder, current episode depressed, mild: Secondary | ICD-10-CM | POA: Diagnosis not present

## 2014-09-30 LAB — COMPREHENSIVE METABOLIC PANEL
ALK PHOS: 51 U/L (ref 39–117)
ALT: 35 U/L (ref 0–53)
AST: 39 U/L — ABNORMAL HIGH (ref 0–37)
Albumin: 4.1 g/dL (ref 3.5–5.2)
Anion gap: 11 (ref 5–15)
BILIRUBIN TOTAL: 0.9 mg/dL (ref 0.3–1.2)
BUN: 10 mg/dL (ref 6–23)
CHLORIDE: 102 mmol/L (ref 96–112)
CO2: 30 mmol/L (ref 19–32)
Calcium: 8.7 mg/dL (ref 8.4–10.5)
Creatinine, Ser: 0.92 mg/dL (ref 0.50–1.35)
GFR calc non Af Amer: >90 mL/min (ref >90)
GLUCOSE: 107 mg/dL — AB (ref 70–99)
POTASSIUM: 3.7 mmol/L (ref 3.5–5.1)
SODIUM: 143 mmol/L (ref 135–145)
Total Protein: 6.7 g/dL (ref 6.0–8.3)

## 2014-09-30 LAB — CBC WITH DIFFERENTIAL/PLATELET
BASOS ABS: 0 10*3/uL (ref 0.0–0.1)
Basophils Relative: 1 % (ref 0–1)
EOS ABS: 0.2 10*3/uL (ref 0.0–0.7)
Eosinophils Relative: 3 % (ref 0–5)
HEMATOCRIT: 44.2 % (ref 39.0–52.0)
Hemoglobin: 15.6 g/dL (ref 13.0–17.0)
LYMPHS ABS: 2.3 10*3/uL (ref 0.7–4.0)
Lymphocytes Relative: 49 % — ABNORMAL HIGH (ref 12–46)
MCH: 30.8 pg (ref 26.0–34.0)
MCHC: 35.3 g/dL (ref 30.0–36.0)
MCV: 87.2 fL (ref 78.0–100.0)
MONOS PCT: 5 % (ref 3–12)
Monocytes Absolute: 0.3 10*3/uL (ref 0.1–1.0)
NEUTROS ABS: 2 10*3/uL (ref 1.7–7.7)
NEUTROS PCT: 42 % — AB (ref 43–77)
PLATELETS: 242 10*3/uL (ref 150–400)
RBC: 5.07 MIL/uL (ref 4.22–5.81)
RDW: 13 % (ref 11.5–15.5)
WBC: 4.7 10*3/uL (ref 4.0–10.5)

## 2014-09-30 LAB — ETHANOL: ALCOHOL ETHYL (B): 413 mg/dL — AB (ref 0–9)

## 2014-09-30 MED ORDER — LORAZEPAM 2 MG/ML IJ SOLN
0.0000 mg | Freq: Four times a day (QID) | INTRAMUSCULAR | Status: DC
Start: 2014-09-30 — End: 2014-09-30

## 2014-09-30 MED ORDER — ONDANSETRON HCL 4 MG/2ML IJ SOLN
4.0000 mg | Freq: Once | INTRAMUSCULAR | Status: AC
  Administered 2014-09-30: 4 mg via INTRAVENOUS
  Filled 2014-09-30: qty 2

## 2014-09-30 MED ORDER — LORAZEPAM 2 MG/ML IJ SOLN
1.0000 mg | Freq: Once | INTRAMUSCULAR | Status: AC
  Administered 2014-09-30: 1 mg via INTRAVENOUS
  Filled 2014-09-30: qty 1

## 2014-09-30 MED ORDER — LORAZEPAM 1 MG PO TABS: 0.0000 mg | ORAL_TABLET | Freq: Two times a day (BID) | ORAL | Status: DC

## 2014-09-30 MED ORDER — VITAMIN B-1 100 MG PO TABS: 100.0000 mg | ORAL_TABLET | Freq: Every day | ORAL | Status: DC

## 2014-09-30 MED ORDER — THIAMINE HCL 100 MG/ML IJ SOLN: 100.0000 mg | Freq: Every day | INTRAMUSCULAR | Status: DC

## 2014-09-30 MED ORDER — LORAZEPAM 2 MG/ML IJ SOLN: 0.0000 mg | Freq: Two times a day (BID) | INTRAMUSCULAR | Status: DC

## 2014-09-30 MED ORDER — LORAZEPAM 1 MG PO TABS: 0.0000 mg | ORAL_TABLET | Freq: Four times a day (QID) | ORAL | Status: DC

## 2014-09-30 NOTE — ED Provider Notes (Signed)
CSN: [id number]     Arrival date & time 09/30/14  0108 History   First MD Initiated Contact with Patient 09/30/14 0111     This chart was scribed for No att. providers found by Forrestine Him, ED Scribe. This patient was seen in room D34C/D34C and the patient's care was started 1:33 AM.   Chief Complaint  Patient presents with  . ETOH withdrawal    HPI  HPI Comments: Chad Mclaughlin is a 34 y.o. male who presents to the Emergency Department here for ETOH intoxication. Pt was recently admitted 09/23/14 for alcohol detox but admits to continued drinking after discharge. He denies following up as instructed on discharge papers.  [name] has had 10 beers today along with 1/5 of liquor and states he typically drinks 1/5 on a daily basis. Pt reports shakes, nausea, and vomiting this evening. No known allergies to medications. Patient currently denies any suicidal or homicidal ideation. Denies any hallucinations.  Past Medical History  Diagnosis Date  . Anxiety   . Bipolar affective disorder   . Alcohol abuse   . Depression    History reviewed. No pertinent past surgical history. No family history on file. History  Substance Use Topics  . Smoking status: Former Research scientist (life sciences)  . Smokeless tobacco: Never Used  . Alcohol Use: 14.4 oz/week    24 Cans of beer per week     Comment: daily     Review of Systems  Constitutional: Negative for fever and chills.  Respiratory: Negative for shortness of breath.   Cardiovascular: Negative for chest pain.  Gastrointestinal: Negative for nausea, vomiting, abdominal pain, diarrhea and constipation.  Skin: Negative for rash and wound.  Neurological: Positive for tremors. Negative for dizziness, weakness and numbness.  Psychiatric/Behavioral: Negative for suicidal ideas and hallucinations.  All other systems reviewed and are negative.     Allergies  Shellfish allergy  Home Medications   Prior to Admission medications   Medication Sig Start Date End Date  Taking? Authorizing Provider  lamoTRIgine (LAMICTAL) 25 MG tablet Take 2 tablets (50 mg total) by mouth 2 (two) times daily. For mood stabilization 09/26/14  Yes Shuvon Rankin, NP  PARoxetine (PAXIL) 30 MG tablet Take 2 tablets (60 mg total) by mouth daily. For depression 09/26/14  Yes Shuvon Rankin, NP  hydrOXYzine (ATARAX/VISTARIL) 25 MG tablet Take 1 tablet (25 mg total) by mouth every 6 (six) hours as needed for anxiety. Patient not taking: Reported on 09/30/2014 09/26/14   Shuvon Rankin, NP  LORazepam (ATIVAN) 1 MG tablet For Alcohol withdrawal Day 1:  Take one tablet (1 mg) four times a day Day 2:  Take one tablet (1 mg) three times a day Day 3:  Take one tablet (1 mg) two times a day Day 4:  Take one table (1 mg) once. Patient not taking: Reported on 09/30/2014 09/26/14   Shuvon Rankin, NP  Multiple Vitamin (MULTIVITAMIN WITH MINERALS) TABS tablet Take 1 tablet by mouth daily. May purchase from over the counter at yr local pharmacy:  Vitamin supplement Patient not taking: Reported on 09/24/2014 08/16/14   Encarnacion Slates, NP  QUEtiapine (SEROQUEL) 100 MG tablet Take 1 tablet (100 mg total) by mouth at bedtime. For mood stabilization Patient not taking: Reported on 09/30/2014 09/26/14   Shuvon Rankin, NP   Triage Vitals: BP 106/76 mmHg  Pulse 125  Temp(Src) 98.1 F (36.7 C) (Oral)  Resp 17  Ht 5\' 4"  (1.626 m)  Wt 148 lb (67.132 kg)  BMI  25.39 kg/m2  SpO2 97%   Physical Exam  Constitutional: He is oriented to person, place, and time. He appears well-developed and well-nourished. No distress.  HENT:  Head: Normocephalic and atraumatic.  Mouth/Throat: Oropharynx is clear and moist.  Eyes: EOM are normal. Pupils are equal, round, and reactive to light.  Neck: Normal range of motion. Neck supple.  Cardiovascular: Normal rate and regular rhythm.   Pulmonary/Chest: Effort normal and breath sounds normal. No respiratory distress. He has no wheezes. He has no rales.  Abdominal: Soft. Bowel sounds  are normal. He exhibits no distension and no mass. There is no tenderness. There is no rebound and no guarding.  Musculoskeletal: Normal range of motion. He exhibits no edema or tenderness.  Neurological: He is alert and oriented to person, place, and time.  Ambulatory. 5/5 motor in all extremities. Sensation intact. Slight slurring speech.  Skin: Skin is warm and dry. No rash noted. No erythema.  Psychiatric: He has a normal mood and affect. His behavior is normal.  Nursing note and vitals reviewed.   ED Course  Procedures (including critical care time)  DIAGNOSTIC STUDIES: Oxygen Saturation is 98% on RA, Normal by my interpretation.    COORDINATION OF CARE: 1:33 AM-Discussed treatment plan with pt at bedside and pt agreed to plan.     Labs Review Labs Reviewed  CBC WITH DIFFERENTIAL/PLATELET - Abnormal; Notable for the following:    Neutrophils Relative % 42 (*)    Lymphocytes Relative 49 (*)    All other components within normal limits  COMPREHENSIVE METABOLIC PANEL - Abnormal; Notable for the following:    Glucose, Bld 107 (*)    AST 39 (*)    All other components within normal limits  ETHANOL - Abnormal; Notable for the following:    Alcohol, Ethyl (B) 413 (*)    All other components within normal limits    Imaging Review No results found.   EKG Interpretation None      MDM   Final diagnoses:  None    I personally performed the services described in this documentation, which was scribed in my presence. The recorded information has been reviewed and is accurate.  Patient clinically intoxicated. Alcohol level greater than 400. He is not wanting detox. Advised he will need a ride to be discharged home. Patient will attempt to contact friend in the morning.  [name], MD 10/03/14 [phone]

## 2014-09-30 NOTE — ED Notes (Signed)
Pt was up getting dressed in room, stating he was leaving and going home. Notified EDP who told pt he would need a sober adult to come in and sign discharge paperwork in order to leave and a cab wasn't an option. Pt stated he would stay. 2 minutes later pt was seen stumbling past the nursing station and was stopped. Pt repeated he was leaving and wouldn't stay. Charge nurse [place] came talked to pt and explained again why he couldn't leave. Pt again returned to his room. Pt currently laying in bed.

## 2014-09-30 NOTE — Progress Notes (Signed)
Patient Discharge Instructions:  After Visit Summary (AVS):   Faxed to:  09/30/14 Discharge Summary Note:   Faxed to:  09/30/14 Psychiatric Admission Assessment Note:   Faxed to:  09/30/14 Suicide Risk Assessment - Discharge Assessment:   Faxed to:  09/30/14 Faxed/Sent to the Next Level Care provider:  09/30/14 Faxed to the [facility] [place], 09/30/2014, 2:46 PM

## 2014-09-30 NOTE — ED Notes (Signed)
Pt not in room.  Notified Dr Maryan Rued.

## 2014-09-30 NOTE — ED Notes (Signed)
Pt not in room.  Belongings gone.

## 2014-09-30 NOTE — ED Notes (Signed)
Pt has not returned to room 

## 2014-09-30 NOTE — ED Notes (Signed)
Patient here for ETOH withdrawal.  Shaking.  States he usually drinks a case of beer a day. Last drink was 6 hours ago

## 2014-09-30 NOTE — ED Notes (Signed)
Pt standing up in room

## 2014-12-22 ENCOUNTER — Inpatient Hospital Stay (HOSPITAL_COMMUNITY)
Admission: EM | Admit: 2014-12-22 | Discharge: 2014-12-25 | DRG: 896 | Disposition: A | Discharge status: Home / Self Care | Payer: Commercial Managed Care - PPO | Attending: Internal Medicine | Admitting: Internal Medicine

## 2014-12-22 ENCOUNTER — Emergency Department (HOSPITAL_COMMUNITY): Payer: Commercial Managed Care - PPO

## 2014-12-22 ENCOUNTER — Encounter (HOSPITAL_COMMUNITY): Payer: Self-pay | Admitting: Emergency Medicine

## 2014-12-22 ENCOUNTER — Other Ambulatory Visit (HOSPITAL_COMMUNITY): Payer: Self-pay

## 2014-12-22 DIAGNOSIS — F10929 Alcohol use, unspecified with intoxication, unspecified: Secondary | ICD-10-CM | POA: Insufficient documentation

## 2014-12-22 DIAGNOSIS — Z87891 Personal history of nicotine dependence: Secondary | ICD-10-CM

## 2014-12-22 DIAGNOSIS — G9340 Encephalopathy, unspecified: Secondary | ICD-10-CM | POA: Diagnosis present

## 2014-12-22 DIAGNOSIS — F10239 Alcohol dependence with withdrawal, unspecified: Principal | ICD-10-CM | POA: Diagnosis present

## 2014-12-22 DIAGNOSIS — R6510 Systemic inflammatory response syndrome (SIRS) of non-infectious origin without acute organ dysfunction: Secondary | ICD-10-CM | POA: Diagnosis present

## 2014-12-22 DIAGNOSIS — Z79899 Other long term (current) drug therapy: Secondary | ICD-10-CM

## 2014-12-22 DIAGNOSIS — Y908 Blood alcohol level of 240 mg/100 ml or more: Secondary | ICD-10-CM | POA: Diagnosis present

## 2014-12-22 DIAGNOSIS — Z046 Encounter for general psychiatric examination, requested by authority: Secondary | ICD-10-CM

## 2014-12-22 DIAGNOSIS — F419 Anxiety disorder, unspecified: Secondary | ICD-10-CM | POA: Diagnosis present

## 2014-12-22 DIAGNOSIS — F32A Depression, unspecified: Secondary | ICD-10-CM

## 2014-12-22 DIAGNOSIS — Z91013 Allergy to seafood: Secondary | ICD-10-CM

## 2014-12-22 DIAGNOSIS — R7989 Other specified abnormal findings of blood chemistry: Secondary | ICD-10-CM | POA: Diagnosis present

## 2014-12-22 DIAGNOSIS — F319 Bipolar disorder, unspecified: Secondary | ICD-10-CM

## 2014-12-22 DIAGNOSIS — F10939 Alcohol use, unspecified with withdrawal, unspecified: Secondary | ICD-10-CM | POA: Diagnosis present

## 2014-12-22 DIAGNOSIS — E872 Acidosis: Secondary | ICD-10-CM | POA: Diagnosis present

## 2014-12-22 DIAGNOSIS — F329 Major depressive disorder, single episode, unspecified: Secondary | ICD-10-CM

## 2014-12-22 DIAGNOSIS — F10920 Alcohol use, unspecified with intoxication, uncomplicated: Secondary | ICD-10-CM

## 2014-12-22 DIAGNOSIS — F1010 Alcohol abuse, uncomplicated: Secondary | ICD-10-CM

## 2014-12-22 LAB — COMPREHENSIVE METABOLIC PANEL
ALT: 57 U/L — AB (ref 0–53)
AST: 84 U/L — AB (ref 0–37)
Albumin: 4.5 g/dL (ref 3.5–5.2)
Alkaline Phosphatase: 46 U/L (ref 39–117)
Anion gap: 15 (ref 5–15)
BILIRUBIN TOTAL: 1.3 mg/dL — AB (ref 0.3–1.2)
BUN: 8 mg/dL (ref 6–23)
CALCIUM: 8.4 mg/dL (ref 8.4–10.5)
CHLORIDE: 99 mmol/L (ref 96–112)
CO2: 25 mmol/L (ref 19–32)
Creatinine, Ser: 0.76 mg/dL (ref 0.50–1.35)
GFR calc non Af Amer: >90 mL/min (ref >90)
GLUCOSE: 109 mg/dL — AB (ref 70–99)
Potassium: 4.2 mmol/L (ref 3.5–5.1)
Sodium: 139 mmol/L (ref 135–145)
TOTAL PROTEIN: 7.2 g/dL (ref 6.0–8.3)

## 2014-12-22 LAB — CBC WITH DIFFERENTIAL/PLATELET
BASOS ABS: 0 10*3/uL (ref 0.0–0.1)
Basophils Relative: 0 % (ref 0–1)
EOS PCT: 0 % (ref 0–5)
Eosinophils Absolute: 0 10*3/uL (ref 0.0–0.7)
HEMATOCRIT: 47.8 % (ref 39.0–52.0)
Hemoglobin: 17.3 g/dL — ABNORMAL HIGH (ref 13.0–17.0)
LYMPHS PCT: 11 % — AB (ref 12–46)
Lymphs Abs: 1.1 10*3/uL (ref 0.7–4.0)
MCH: 30.7 pg (ref 26.0–34.0)
MCHC: 36.2 g/dL — AB (ref 30.0–36.0)
MCV: 84.9 fL (ref 78.0–100.0)
MONOS PCT: 4 % (ref 3–12)
Monocytes Absolute: 0.3 10*3/uL (ref 0.1–1.0)
NEUTROS ABS: 8.4 10*3/uL — AB (ref 1.7–7.7)
NEUTROS PCT: 85 % — AB (ref 43–77)
Platelets: 286 10*3/uL (ref 150–400)
RBC: 5.63 MIL/uL (ref 4.22–5.81)
RDW: 12.6 % (ref 11.5–15.5)
WBC: 9.8 10*3/uL (ref 4.0–10.5)

## 2014-12-22 LAB — RAPID URINE DRUG SCREEN, HOSP PERFORMED
AMPHETAMINES: NOT DETECTED
BARBITURATES: NOT DETECTED
BENZODIAZEPINES: NOT DETECTED
Cocaine: NOT DETECTED
Opiates: NOT DETECTED
TETRAHYDROCANNABINOL: NOT DETECTED

## 2014-12-22 LAB — I-STAT CG4 LACTIC ACID, ED: Lactic Acid, Venous: 3.5 mmol/L (ref 0.5–2.0)

## 2014-12-22 LAB — CBG MONITORING, ED: GLUCOSE-CAPILLARY: 227 mg/dL — AB (ref 70–99)

## 2014-12-22 LAB — ETHANOL: Alcohol, Ethyl (B): 417 mg/dL (ref 0–9)

## 2014-12-22 LAB — MAGNESIUM: Magnesium: 1.9 mg/dL (ref 1.5–2.5)

## 2014-12-22 MED ORDER — SODIUM CHLORIDE 0.9 % IV BOLUS (SEPSIS)
500.0000 mL | INTRAVENOUS | Status: AC
  Administered 2014-12-23: 500 mL via INTRAVENOUS

## 2014-12-22 MED ORDER — LORAZEPAM 1 MG PO TABS: 0.0000 mg | ORAL_TABLET | Freq: Two times a day (BID) | ORAL | Status: DC

## 2014-12-22 MED ORDER — VANCOMYCIN HCL IN DEXTROSE 1-5 GM/200ML-% IV SOLN
1000.0000 mg | Freq: Once | INTRAVENOUS | Status: AC
  Administered 2014-12-22: 1000 mg via INTRAVENOUS
  Filled 2014-12-22: qty 200

## 2014-12-22 MED ORDER — LAMOTRIGINE 25 MG PO TABS
50.0000 mg | ORAL_TABLET | Freq: Two times a day (BID) | ORAL | Status: DC
  Administered 2014-12-22: 50 mg via ORAL
  Filled 2014-12-22 (×3): qty 2

## 2014-12-22 MED ORDER — THIAMINE HCL 100 MG/ML IJ SOLN
Freq: Once | INTRAVENOUS | Status: AC
  Administered 2014-12-22: via INTRAVENOUS
  Filled 2014-12-22: qty 1000

## 2014-12-22 MED ORDER — PIPERACILLIN-TAZOBACTAM 3.375 G IVPB 30 MIN
3.3750 g | Freq: Once | INTRAVENOUS | Status: AC
  Administered 2014-12-22: 3.375 g via INTRAVENOUS
  Filled 2014-12-22: qty 50

## 2014-12-22 MED ORDER — ONDANSETRON 8 MG PO TBDP
8.0000 mg | ORAL_TABLET | Freq: Once | ORAL | Status: AC
  Administered 2014-12-22: 8 mg via ORAL
  Filled 2014-12-22: qty 1

## 2014-12-22 MED ORDER — LORAZEPAM 2 MG/ML IJ SOLN
0.0000 mg | Freq: Two times a day (BID) | INTRAMUSCULAR | Status: DC
  Filled 2014-12-22: qty 1

## 2014-12-22 MED ORDER — ONDANSETRON HCL 4 MG PO TABS: 4.0000 mg | ORAL_TABLET | Freq: Three times a day (TID) | ORAL | Status: DC | PRN

## 2014-12-22 MED ORDER — PIPERACILLIN-TAZOBACTAM 3.375 G IVPB: 3.3750 g | Freq: Three times a day (TID) | INTRAVENOUS | Status: DC

## 2014-12-22 MED ORDER — IBUPROFEN 200 MG PO TABS: 600.0000 mg | ORAL_TABLET | Freq: Three times a day (TID) | ORAL | Status: DC | PRN

## 2014-12-22 MED ORDER — THIAMINE HCL 100 MG/ML IJ SOLN
100.0000 mg | Freq: Every day | INTRAMUSCULAR | Status: DC
  Administered 2014-12-22: 100 mg via INTRAVENOUS
  Filled 2014-12-22 (×2): qty 2

## 2014-12-22 MED ORDER — SODIUM CHLORIDE 0.9 % IV BOLUS (SEPSIS)
1000.0000 mL | Freq: Once | INTRAVENOUS | Status: AC
  Administered 2014-12-22: 1000 mL via INTRAVENOUS

## 2014-12-22 MED ORDER — ONDANSETRON 4 MG PO TBDP: 4.0000 mg | ORAL_TABLET | Freq: Once | ORAL | Status: DC

## 2014-12-22 MED ORDER — GABAPENTIN 300 MG PO CAPS
300.0000 mg | ORAL_CAPSULE | Freq: Every day | ORAL | Status: DC
  Administered 2014-12-22: 300 mg via ORAL
  Filled 2014-12-22: qty 1

## 2014-12-22 MED ORDER — LORAZEPAM 1 MG PO TABS: 0.0000 mg | ORAL_TABLET | Freq: Four times a day (QID) | ORAL | Status: DC

## 2014-12-22 MED ORDER — HYDROXYZINE HCL 25 MG PO TABS: 25.0000 mg | ORAL_TABLET | Freq: Four times a day (QID) | ORAL | Status: DC | PRN

## 2014-12-22 MED ORDER — VANCOMYCIN HCL IN DEXTROSE 1-5 GM/200ML-% IV SOLN: 1000.0000 mg | Freq: Three times a day (TID) | INTRAVENOUS | Status: DC

## 2014-12-22 MED ORDER — ZOLPIDEM TARTRATE 5 MG PO TABS: 5.0000 mg | ORAL_TABLET | Freq: Every evening | ORAL | Status: DC | PRN

## 2014-12-22 MED ORDER — PAROXETINE HCL 20 MG PO TABS
60.0000 mg | ORAL_TABLET | Freq: Every day | ORAL | Status: DC
  Filled 2014-12-22 (×2): qty 2

## 2014-12-22 MED ORDER — METHOCARBAMOL 1000 MG/10ML IJ SOLN
1000.0000 mg | Freq: Once | INTRAMUSCULAR | Status: AC
  Administered 2014-12-23: 1000 mg via INTRAVENOUS
  Filled 2014-12-22: qty 10

## 2014-12-22 MED ORDER — SODIUM CHLORIDE 0.9 % IV BOLUS (SEPSIS)
1000.0000 mL | INTRAVENOUS | Status: AC
  Administered 2014-12-22 – 2014-12-23 (×2): 1000 mL via INTRAVENOUS

## 2014-12-22 MED ORDER — METHOCARBAMOL 1000 MG/10ML IJ SOLN: 1000.0000 mg | Freq: Once | INTRAMUSCULAR | Status: DC

## 2014-12-22 MED ORDER — QUETIAPINE FUMARATE 100 MG PO TABS
100.0000 mg | ORAL_TABLET | Freq: Every day | ORAL | Status: DC
  Administered 2014-12-22: 100 mg via ORAL
  Filled 2014-12-22: qty 1

## 2014-12-22 MED ORDER — NICOTINE 21 MG/24HR TD PT24
21.0000 mg | MEDICATED_PATCH | Freq: Every day | TRANSDERMAL | Status: DC
  Filled 2014-12-22: qty 1

## 2014-12-22 MED ORDER — LORAZEPAM 2 MG/ML IJ SOLN
0.0000 mg | Freq: Four times a day (QID) | INTRAMUSCULAR | Status: DC
  Administered 2014-12-22 (×2): 2 mg via INTRAVENOUS
  Filled 2014-12-22: qty 1

## 2014-12-22 MED ORDER — VITAMIN B-1 100 MG PO TABS: 100.0000 mg | ORAL_TABLET | Freq: Every day | ORAL | Status: DC

## 2014-12-22 MED ORDER — ALUM & MAG HYDROXIDE-SIMETH 200-200-20 MG/5ML PO SUSP: 30.0000 mL | ORAL | Status: DC | PRN

## 2014-12-22 MED ORDER — LORAZEPAM 2 MG/ML IJ SOLN: 0.0000 mg | Freq: Two times a day (BID) | INTRAMUSCULAR | Status: DC

## 2014-12-22 MED ORDER — KETOROLAC TROMETHAMINE 15 MG/ML IJ SOLN
15.0000 mg | Freq: Once | INTRAMUSCULAR | Status: AC
  Administered 2014-12-22: 15 mg via INTRAVENOUS
  Filled 2014-12-22: qty 1

## 2014-12-22 NOTE — ED Notes (Signed)
Reported HR of 165 to Dr. Zenia Resides, EKG obtained, CBG obtained, pt moved to ED side, report given to brittney, RN

## 2014-12-22 NOTE — ED Notes (Signed)
Pt's mother called.  Pt gave this Probation officer permission to speak with his mother about his care.  Pt's mother states that she was planning on taking out IVC papers tomorrow (mom is in [place]).  Mom was explained that pt has every intention of leaving once he sobers up.  Mom states that she has a cousin in [place] that she will call to take out papers right now.

## 2014-12-22 NOTE — ED Provider Notes (Addendum)
Medical screening examination/treatment/procedure(s) were conducted as a shared visit with non-physician practitioner(s) and myself.  I personally evaluated the patient during the encounter.   EKG Interpretation   Date/Time:  Sunday December 22 2014 22:37:09 EDT Ventricular Rate:  150 PR Interval:  130 QRS Duration: 80 QT Interval:  258 QTC Calculation: 407 R Axis:   64 Text Interpretation:  Sinus tachycardia Septal infarct , age undetermined  Abnormal ECG Confirmed by Zenia Resides  MD, Lether Tesch ([id number]) on 12/22/2014 10:43:36  PM     Patient initially presented here for alcohol detox. He was monitored for several hours and on routine vital signs recheck was found to be tachycardic after being in the ED for several hours.. He does feel tremulous at this time. Patient notes slight cough. Elevated lactate noted. Will activate level 2 sepsis and patient will be admitted to the hospital  Lacretia Leigh, MD 12/22/14 [place], MD 12/22/14 2325

## 2014-12-22 NOTE — ED Provider Notes (Signed)
CSN: [id number]     Arrival date & time 12/22/14  68 History   First MD Initiated Contact with Patient 12/22/14 1333     Chief Complaint  Patient presents with  . Alcohol Intoxication     (Consider location/radiation/quality/duration/timing/severity/associated sxs/prior Treatment) HPI  LEVEL V CAVEAT: ETOH intoxication.  PCP: No PCP Per Patient Blood pressure 116/62, pulse 105, temperature 98.7 F (37.1 C), temperature source Oral, resp. rate 17, SpO2 94 %.  Chad Mclaughlin is a 34 y.o.male with a significant PMH of Anxiety, bipolar affective disorder, alcohol abuse and depression presents to the ER by EMS with complaints of alcohol intoxication. He admits to drinking alcohol prior to going to [employer]. He said he drank beer. He went to [place] and had a few more drinks. He then passed out onto the bar counter and they called EMS. The patient is currently awake. He reports being depressed because his wife is leaving him. His wife is leaving him because he is in love with another woman. He denies doing any drugs. Patient reports being very hungry because he hasn't eaten in a few days due to the depression.     Past Medical History  Diagnosis Date  . Anxiety   . Bipolar affective disorder   . Alcohol abuse   . Depression    History reviewed. No pertinent past surgical history. No family history on file. History  Substance Use Topics  . Smoking status: Former Research scientist (life sciences)  . Smokeless tobacco: Never Used  . Alcohol Use: 14.4 oz/week    24 Cans of beer per week     Comment: daily     Review of Systems LEVEL V CAVEAT: ETOH intoxication.  Allergies  Shellfish allergy  Home Medications   Prior to Admission medications   Medication Sig Start Date End Date Taking? Authorizing Provider  hydrOXYzine (ATARAX/VISTARIL) 25 MG tablet Take 1 tablet (25 mg total) by mouth every 6 (six) hours as needed for anxiety. Patient not taking: Reported on 09/30/2014 09/26/14   Shuvon B Rankin, NP   lamoTRIgine (LAMICTAL) 25 MG tablet Take 2 tablets (50 mg total) by mouth 2 (two) times daily. For mood stabilization 09/26/14   Shuvon B Rankin, NP  LORazepam (ATIVAN) 1 MG tablet For Alcohol withdrawal Day 1:  Take one tablet (1 mg) four times a day Day 2:  Take one tablet (1 mg) three times a day Day 3:  Take one tablet (1 mg) two times a day Day 4:  Take one table (1 mg) once. Patient not taking: Reported on 09/30/2014 09/26/14   Shuvon B Rankin, NP  Multiple Vitamin (MULTIVITAMIN WITH MINERALS) TABS tablet Take 1 tablet by mouth daily. May purchase from over the counter at yr local pharmacy:  Vitamin supplement Patient not taking: Reported on 09/24/2014 08/16/14   Encarnacion Slates, NP  PARoxetine (PAXIL) 30 MG tablet Take 2 tablets (60 mg total) by mouth daily. For depression 09/26/14   Shuvon B Rankin, NP  QUEtiapine (SEROQUEL) 100 MG tablet Take 1 tablet (100 mg total) by mouth at bedtime. For mood stabilization Patient not taking: Reported on 09/30/2014 09/26/14   Shuvon B Rankin, NP   BP 116/62 mmHg  Pulse 105  Temp(Src) 98.7 F (37.1 C) (Oral)  Resp 17  SpO2 94% Physical Exam  Constitutional: He appears well-developed and well-nourished. No distress.  HENT:  Head: Normocephalic and atraumatic.  Eyes: Pupils are equal, round, and reactive to light.  Neck: Normal range of motion. Neck supple.  Cardiovascular: Normal rate and regular rhythm.   Pulmonary/Chest: Effort normal.  Abdominal: Soft.  Neurological: He is alert.  Easily arousable.   Skin: Skin is warm and dry. No abrasion, no burn and no rash noted.  Psychiatric: His speech is normal. His mood appears not anxious. He is not actively hallucinating. He exhibits a depressed mood. He expresses no homicidal and no suicidal ideation.  Nursing note and vitals reviewed.   ED Course  Procedures (including critical care time) Labs Review Labs Reviewed  ETHANOL - Abnormal; Notable for the following:    Alcohol, Ethyl (B) 417 (*)     All other components within normal limits  CBC WITH DIFFERENTIAL/PLATELET - Abnormal; Notable for the following:    Hemoglobin 17.3 (*)    MCHC 36.2 (*)    Neutrophils Relative % 85 (*)    Neutro Abs 8.4 (*)    Lymphocytes Relative 11 (*)    All other components within normal limits  COMPREHENSIVE METABOLIC PANEL - Abnormal; Notable for the following:    Glucose, Bld 109 (*)    AST 84 (*)    ALT 57 (*)    Total Bilirubin 1.3 (*)    All other components within normal limits  URINE RAPID DRUG SCREEN (HOSP PERFORMED)    Imaging Review No results found.   EKG Interpretation None      MDM   Final diagnoses:  Alcohol intoxication, uncomplicated  Depression    The patient is easily awakened with verbal stimuli. He requests food, I gave him a piece of graham cracker to see how he would do but he feel alseep while eating it, so he is now NPO. His mother wants him to be IVC'd. At this point he has not admitted to being SI/HI but he has been depressed. His ETOH level is 417. The nurse informed me that his sats dropped to upper 80's while asleep, so they propped him up and he improved to maintain his sats at 98%on room air. He is currently maintaining his airway appropriately.   3:31pm At end of shift, the patient will be handed off to [employer], PA-C. She will monitor is vital signs and watch him till he sobers. At which point she will ask about SI/HI- request for detox.  Filed Vitals:   12/22/14 1453  BP: 116/62  Pulse: 105  Temp:   Resp: [id number] PA-C 12/22/14 1532  Elnora Morrison, MD 12/22/14 [phone]

## 2014-12-22 NOTE — ED Provider Notes (Signed)
PROGRESS NOTE                                                                                                                 This is a sign-out from [place] at shift change: Chad Mclaughlin is a 34 y.o. male presenting with EtOH intoxication (h/o DT). Plan is to watch the patient to sober up and double check if he needs detox or has SI.  Please refer to previous note for full HPI, ROS, PMH and PE.   Patient seen and evaluated the bedside, he is denying suicidal ideation to me. Denies auditory or visual hallucinations, states that he does have a history of DTs.  Patient's mother has taken out involuntary commitment papers on this patient for suicidal ideation and suicidal threats.  TCU nurse has informed me that patient's heart rate is in the 160s. He is resting comfortably, has received 4 mg of Ativan, low CIWA score. EKG shows sinus tach. Patient is moved to the acute ED. Patient seen and evaluated the bedside, he is resting comfortably, no tongue fasciculations, patient is somnolent. States that he feels terrible when asked to clarify says he is aching everywhere nauseous and dehydrated. Oral temperature is elevated at 100.3. Lactic acid is 3.5. Will obtain blood cultures, chest x-ray, urinalysis. Patient is bolused 1 L banana bag is ordered. Patient reports cough. Dr. has evaluated the patient and initiated level II sepsis and initiated vancomycin and Zosyn. Unclear if this is sepsis versus DTs. UA without signs of infection. Chest x-ray is clear. Patient's vitals are improving with hydration, he is around 140.  This is a shared visit with the attending physician who personally evaluated the patient and agrees with the care plan.    Monico Blitz, PA-C 12/23/14 0139  Lacretia Leigh, MD 12/24/14 [phone]

## 2014-12-22 NOTE — ED Notes (Signed)
Per EMS pt comes from [place] for intoxication.  Bartender reported to EMS that he was normal when he went in and sat at bar.  Had few beers then was past out out so EMS was called.

## 2014-12-22 NOTE — ED Notes (Signed)
Chad Mclaughlin [phone] MOM

## 2014-12-22 NOTE — ED Notes (Signed)
Gave Lactic to Dr. Zenia Resides

## 2014-12-22 NOTE — ED Notes (Signed)
Pt moved to room 26

## 2014-12-22 NOTE — BH Assessment (Signed)
Assessment completed. Consulted Marcelene Butte, NP who recommended inpatient treatment. Elmyra Ricks Pisciotta, PA-C has been informed of the recommendation.

## 2014-12-22 NOTE — ED Notes (Signed)
X RAY at bedside 

## 2014-12-22 NOTE — ED Notes (Signed)
Robaxin must not compatible with current fluids and medications. Will hold until one of his three IV accesses become available.

## 2014-12-22 NOTE — ED Notes (Signed)
Bed: [facility] Expected date:  Expected time:  Means of arrival:  Comments: 

## 2014-12-22 NOTE — ED Notes (Signed)
Report given by Luis Abed from behavioral health

## 2014-12-22 NOTE — ED Notes (Signed)
Pt dry heaving. Pt repositioned in bed to prevent pt from aspiration.

## 2014-12-22 NOTE — ED Notes (Signed)
Chad Mclaughlin from [facility] called to report possibility of bed at [facility]. He was informed of new on set tachycardia.

## 2014-12-22 NOTE — BH Assessment (Addendum)
Tele Assessment Note   Chad Mclaughlin is an 34 y.o. male presenting to [facility] due to alcohol intoxication. Pt stated "I just got out of rehab and started drinking". "I want help with my drink". "A lot of help". "Everything was fine until I met someone in rehab". "My wife and I have been separated for 2 months".  Pt denies SI, HI and AVH at this time. Pt did not report any previous suicide attempts or self-injurious behaviors. PT is endorsing some depressive symptom and shared that he is dealing with some relationship stressors. Pt did not report any issues with his sleep or appetite only stated "sometimes I don't have access to food". Pt denies having access to weapons or firearms and did not report any pending criminal charges. PT did not report any upcoming court dates. Pt did not report any physical, sexual or emotional abuse at this time. PT reported that he is currently not receiving any mental health treatment at this time but reported that he is prescribed Paxil, Lamictal and Seroquel. Pt did not report any illicit substance abuse but shared that he has been drinking "at least a pint" since being discharged from treatment.  Pt is currently intoxicated. Pt's BAL is 417.  Collateral information has been gathered from the petitioner (pt's cousin). She reported that pt came by her house today in a fog and really upset. She reported that once she let him know that his drinking was not okay he told her that he should not be on this earth and left her home driving. She shared that she attempted to find him but was unable to locate him. She reported that she went to [employer] ad told them to contact her if he came by. She reported that while driving pass the diner she notice an ambulance there and she spoke with  an officer who encouraged her to take out commitment papers if she thought he was a danger to himself. She shared that she was also on the phone with his mother who said that it would probably be best  to take out the commitment paperwork. She also reported that the nurse at the hospital informed his mother that he was threating to leave and she should take out commitment papers show he could just not walk out. She reported that the nurse shared with pt's mother that once he is sober he is planning to leave the ED.   Axis I: Depressive Disorder NOS and Alcohol intoxication, with moderate or severe use disorder   Past Medical History:  Past Medical History  Diagnosis Date  . Anxiety   . Bipolar affective disorder   . Alcohol abuse   . Depression     History reviewed. No pertinent past surgical history.  Family History: No family history on file.  Social History:  reports that he has quit smoking. He has never used smokeless tobacco. He reports that he drinks about 14.4 oz of alcohol per week. He reports that he does not use illicit drugs.  Additional Social History:  Alcohol / Drug Use History of alcohol / drug use?: Yes Longest period of sobriety (when/how long): 2 months while in treatment  Substance #1 Name of Substance 1: Alcohol  1 - Age of First Use: 15 1 - Amount (size/oz): 1 pint  1 - Frequency: daily  1 - Duration: 7 day  1 - Last Use / Amount: 12-22-14 BAL-417  CIWA: CIWA-Ar BP: 117/56 mmHg Pulse Rate: (!) 124 Nausea and  Vomiting: intermittent nausea with dry heaves Tactile Disturbances: none Tremor: moderate, with patient's arms extended Auditory Disturbances: not present Paroxysmal Sweats: barely perceptible sweating, palms moist Visual Disturbances: not present Anxiety: moderately anxious, or guarded, so anxiety is inferred Headache, Fullness in Head: very mild Agitation: two Orientation and Clouding of Sensorium: oriented and can do serial additions CIWA-Ar Total: 16 COWS:    PATIENT STRENGTHS: (choose at least two) Average or above average intelligence Capable of independent living  Allergies:  Allergies  Allergen Reactions  . Shellfish Allergy  Anaphylaxis    Home Medications:  (Not in a hospital admission)  OB/GYN Status:  No LMP for male patient.  General Assessment Data Location of Assessment: [facility] ED Is this a Tele or Face-to-Face Assessment?: Face-to-Face Is this an Initial Assessment or a Re-assessment for this encounter?: Initial Assessment Living Arrangements: Spouse/significant other Can pt return to current living arrangement?: No Admission Status: Voluntary Is patient capable of signing voluntary admission?: Yes Transfer from[facility] Referral Source: Self/Family/Friend     [place] Living Arrangements: Spouse/significant other Name of Psychiatrist: No provider reported at this time.  Name of Therapist: No provider reported at this time.   Education Status Is patient currently in school?: No  Risk to self with the past 6 months Suicidal Ideation: No Suicidal Intent: No Is patient at risk for suicide?: No Suicidal Plan?: No Access to Means: No What has been your use of drugs/alcohol within the last 12 months?: Daily alcohol use reported.  Previous Attempts/Gestures: No How many times?: 0 Other Self Harm Risks: No other self harm risk identified.  Triggers for Past Attempts: None known Intentional Self Injurious Behavior: None Family Suicide History: No Recent stressful life event(s): Other (Comment) (Seperation from wife) Persecutory voices/beliefs?: No Depression: Yes Depression Symptoms: Isolating, Fatigue, Guilt, Loss of interest in usual pleasures, Feeling worthless/self pity, Feeling angry/irritable Substance abuse history and/or treatment for substance abuse?: Yes Suicide prevention information given to non-admitted patients: Not applicable  Risk to Others within the past 6 months Homicidal Ideation: No Thoughts of Harm to Others: No Current Homicidal Intent: No Current Homicidal Plan: No Access to Homicidal Means: No Identified Victim: NA History of harm to others?:  No Assessment of Violence: On admission Violent Behavior Description: No violent behaviors reported. Pt is calm and cooperative at this time.  Does patient have access to weapons?: No Criminal Charges Pending?: No Does patient have a court date: No  Psychosis Hallucinations: None noted Delusions: None noted  Mental Status Report Appearance/Hygiene: In scrubs Eye Contact: Poor Motor Activity: Unsteady Speech: Soft Level of Consciousness: Drowsy Mood: Depressed Affect: Appropriate to circumstance Anxiety Level: Minimal Thought Processes: Coherent, Relevant Judgement: Unimpaired (BAL-417) Orientation: Person, Place, Time, Situation Obsessive Compulsive Thoughts/Behaviors: None  Cognitive Functioning Concentration: Fair Memory: Recent Intact IQ: Average Insight: Poor Impulse Control: Poor Appetite: Fair Weight Loss: 5 Weight Gain: 0 Sleep: No Change Total Hours of Sleep: 8 Vegetative Symptoms: None  ADLScreening [facility]) Patient's cognitive ability adequate to safely complete daily activities?: Yes Patient able to express need for assistance with ADLs?: Yes Independently performs ADLs?: Yes (appropriate for developmental age)  Prior Inpatient Therapy Prior Inpatient Therapy: Yes Prior Therapy Dates: 1/16, 2/16; 11/15,12/15, 1/16 Prior Therapy Facilty/Provider(s): [facility]; [facility]  Reason for Treatment: Alcohol Abuse  Prior Outpatient Therapy Prior Outpatient Therapy: No  ADL Screening (condition at time of admission) Patient's cognitive ability adequate to safely complete daily activities?: Yes Is the patient deaf or have  difficulty hearing?: No Does the patient have difficulty seeing, even when wearing glasses/contacts?: No Does the patient have difficulty concentrating, remembering, or making decisions?: No Patient able to express need for assistance with ADLs?: Yes Does the patient have difficulty dressing or bathing?:  No Independently performs ADLs?: Yes (appropriate for developmental age)       Abuse/Neglect Assessment (Assessment to be complete while patient is alone) Physical Abuse: Denies Verbal Abuse: Denies Sexual Abuse: Denies Exploitation of patient/patient's resources: Denies Self-Neglect: Denies     Regulatory affairs officer (For Healthcare) Does patient have an advance directive?: No Would patient like information on creating an advanced directive?: No - patient declined information    Additional Information 1:1 In Past 12 Months?: No CIRT Risk: No Elopement Risk: No Does patient have medical clearance?: No     Disposition:  Disposition Initial Assessment Completed for this Encounter: Yes  Gudrun Axe S 12/22/2014 8:05 PM

## 2014-12-22 NOTE — Progress Notes (Signed)
ANTIBIOTIC CONSULT NOTE - INITIAL  Pharmacy Consult for Vancomycin & Zosyn Indication: Sepsis  Allergies  Allergen Reactions  . Shellfish Allergy Anaphylaxis    Patient Measurements: Weight: 147 lb 14.9 oz (67.1 kg) Height: 64 inches  Vital Signs: Temp: 100.3 F (37.9 C) (04/24 2304) Temp Source: Rectal (04/24 2304) BP: 105/61 mmHg (04/24 2302) Pulse Rate: 149 (04/24 2302) Intake/Output from previous day:   Intake/Output from this shift:    Labs:  Recent Labs  12/22/14 1411  WBC 9.8  HGB 17.3*  PLT 286  CREATININE 0.76   Estimated Creatinine Clearance: 111 mL/min (by C-G formula based on Cr of 0.76). No results for input(s): VANCOTROUGH, VANCOPEAK, VANCORANDOM, GENTTROUGH, GENTPEAK, GENTRANDOM, TOBRATROUGH, TOBRAPEAK, TOBRARND, AMIKACINPEAK, AMIKACINTROU, AMIKACIN in the last 72 hours.   Microbiology: No results found for this or any previous visit (from the past 720 hour(s)).  Medical History: Past Medical History  Diagnosis Date  . Anxiety   . Bipolar affective disorder   . Alcohol abuse   . Depression     Medications:  Scheduled:  . gabapentin  300 mg Oral QHS  . ketorolac  15 mg Intravenous Once  . lamoTRIgine  50 mg Oral BID  . LORazepam  0-4 mg Intravenous 4 times per day  . [START ON 12/24/2014] LORazepam  0-4 mg Intravenous Q12H  . LORazepam  0-4 mg Oral 4 times per day  . [START ON 12/24/2014] LORazepam  0-4 mg Oral Q12H  . nicotine  21 mg Transdermal Daily  . PARoxetine  60 mg Oral Daily  . QUEtiapine  100 mg Oral QHS  . thiamine  100 mg Intravenous Daily  . thiamine  100 mg Oral Daily   Infusions:  . methocarbamol (ROBAXIN)  IV    . piperacillin-tazobactam    . sodium chloride 1,000 mL (12/22/14 2301)  . sodium chloride 1,000 mL (12/22/14 2334)   Followed by  . [START ON 12/23/2014] sodium chloride    . vancomycin 1,000 mg (12/22/14 2333)   Assessment:  34 yr male arrived to ED for alcohol detox.  Pt now with elevated HR, Tm = 100.3.   Patient to be admitted for sepsis  Vancomycin 1gm x 1 given in ED @ 23:33 and Zosyn 3.375gm x 1 ordered in ED  Pharmacy consulted to dose Vancomycin & Zosyn for sepsis  Blood & urine cultures ordered  CrCl > 100 ml/min  Goal of Therapy:  Vancomycin trough level 15-20 mcg/ml  Plan:  Measure antibiotic drug levels at steady state Follow up culture results   Zosyn 3.375gm IV q8h (each dose infused over 4 hrs)  Vancomycin 1gm IV q8h  Greysin Medlen, Toribio Harbour, PharmD 12/22/2014,11:35 PM

## 2014-12-23 ENCOUNTER — Encounter (HOSPITAL_COMMUNITY): Payer: Self-pay | Admitting: Internal Medicine

## 2014-12-23 ENCOUNTER — Observation Stay (HOSPITAL_COMMUNITY): Payer: Commercial Managed Care - PPO

## 2014-12-23 DIAGNOSIS — F10231 Alcohol dependence with withdrawal delirium: Secondary | ICD-10-CM | POA: Diagnosis not present

## 2014-12-23 DIAGNOSIS — Y909 Presence of alcohol in blood, level not specified: Secondary | ICD-10-CM | POA: Diagnosis not present

## 2014-12-23 DIAGNOSIS — R6510 Systemic inflammatory response syndrome (SIRS) of non-infectious origin without acute organ dysfunction: Secondary | ICD-10-CM | POA: Diagnosis present

## 2014-12-23 DIAGNOSIS — F1010 Alcohol abuse, uncomplicated: Secondary | ICD-10-CM | POA: Diagnosis not present

## 2014-12-23 DIAGNOSIS — Z91013 Allergy to seafood: Secondary | ICD-10-CM | POA: Diagnosis not present

## 2014-12-23 DIAGNOSIS — Y908 Blood alcohol level of 240 mg/100 ml or more: Secondary | ICD-10-CM | POA: Diagnosis present

## 2014-12-23 DIAGNOSIS — A419 Sepsis, unspecified organism: Secondary | ICD-10-CM | POA: Diagnosis not present

## 2014-12-23 DIAGNOSIS — F10230 Alcohol dependence with withdrawal, uncomplicated: Secondary | ICD-10-CM | POA: Diagnosis not present

## 2014-12-23 DIAGNOSIS — F10120 Alcohol abuse with intoxication, uncomplicated: Secondary | ICD-10-CM | POA: Diagnosis not present

## 2014-12-23 DIAGNOSIS — Z79899 Other long term (current) drug therapy: Secondary | ICD-10-CM | POA: Diagnosis not present

## 2014-12-23 DIAGNOSIS — F10121 Alcohol abuse with intoxication delirium: Secondary | ICD-10-CM | POA: Diagnosis not present

## 2014-12-23 DIAGNOSIS — F10239 Alcohol dependence with withdrawal, unspecified: Secondary | ICD-10-CM | POA: Diagnosis present

## 2014-12-23 DIAGNOSIS — G9340 Encephalopathy, unspecified: Secondary | ICD-10-CM | POA: Diagnosis present

## 2014-12-23 DIAGNOSIS — E872 Acidosis: Secondary | ICD-10-CM | POA: Diagnosis present

## 2014-12-23 DIAGNOSIS — F10929 Alcohol use, unspecified with intoxication, unspecified: Secondary | ICD-10-CM | POA: Insufficient documentation

## 2014-12-23 DIAGNOSIS — F319 Bipolar disorder, unspecified: Secondary | ICD-10-CM | POA: Diagnosis present

## 2014-12-23 DIAGNOSIS — F329 Major depressive disorder, single episode, unspecified: Secondary | ICD-10-CM | POA: Diagnosis not present

## 2014-12-23 DIAGNOSIS — R7989 Other specified abnormal findings of blood chemistry: Secondary | ICD-10-CM | POA: Diagnosis present

## 2014-12-23 DIAGNOSIS — Z87891 Personal history of nicotine dependence: Secondary | ICD-10-CM | POA: Diagnosis not present

## 2014-12-23 DIAGNOSIS — F419 Anxiety disorder, unspecified: Secondary | ICD-10-CM | POA: Diagnosis present

## 2014-12-23 LAB — BASIC METABOLIC PANEL
ANION GAP: 6 (ref 5–15)
ANION GAP: 3 — AB (ref 5–15)
BUN: 9 mg/dL (ref 6–23)
BUN: 10 mg/dL (ref 6–23)
CHLORIDE: 108 mmol/L (ref 96–112)
CO2: 25 mmol/L (ref 19–32)
CO2: 27 mmol/L (ref 19–32)
Calcium: 7.3 mg/dL — ABNORMAL LOW (ref 8.4–10.5)
Calcium: 7.5 mg/dL — ABNORMAL LOW (ref 8.4–10.5)
Chloride: 109 mmol/L (ref 96–112)
Creatinine, Ser: 0.94 mg/dL (ref 0.50–1.35)
Creatinine, Ser: 0.86 mg/dL (ref 0.50–1.35)
GFR calc Af Amer: >90 mL/min (ref >90)
GFR calc Af Amer: >90 mL/min (ref >90)
GFR calc non Af Amer: >90 mL/min (ref >90)
GLUCOSE: 86 mg/dL (ref 70–99)
Glucose, Bld: 104 mg/dL — ABNORMAL HIGH (ref 70–99)
POTASSIUM: 3.9 mmol/L (ref 3.5–5.1)
Potassium: 4.2 mmol/L (ref 3.5–5.1)
Sodium: 140 mmol/L (ref 135–145)
Sodium: 138 mmol/L (ref 135–145)

## 2014-12-23 LAB — PROCALCITONIN: Procalcitonin: <0.1 ng/mL

## 2014-12-23 LAB — CBG MONITORING, ED
GLUCOSE-CAPILLARY: 159 mg/dL — AB (ref 70–99)
Glucose-Capillary: 77 mg/dL (ref 70–99)

## 2014-12-23 LAB — AMMONIA: AMMONIA: 21 umol/L (ref 11–32)

## 2014-12-23 LAB — URINALYSIS, ROUTINE W REFLEX MICROSCOPIC
Bilirubin Urine: NEGATIVE
Glucose, UA: 100 mg/dL — AB
HGB URINE DIPSTICK: NEGATIVE
KETONES UR: NEGATIVE mg/dL
Leukocytes, UA: NEGATIVE
Nitrite: NEGATIVE
PH: 6.5 (ref 5.0–8.0)
Protein, ur: NEGATIVE mg/dL
Specific Gravity, Urine: 1.022 (ref 1.005–1.030)
Urobilinogen, UA: 1 mg/dL (ref 0.0–1.0)

## 2014-12-23 LAB — HEPATIC FUNCTION PANEL
ALBUMIN: 3.1 g/dL — AB (ref 3.5–5.2)
ALT: 46 U/L (ref 0–53)
AST: 70 U/L — AB (ref 0–37)
Alkaline Phosphatase: 33 U/L — ABNORMAL LOW (ref 39–117)
BILIRUBIN DIRECT: 0.2 mg/dL (ref 0.0–0.5)
BILIRUBIN TOTAL: 1.7 mg/dL — AB (ref 0.3–1.2)
Indirect Bilirubin: 1.5 mg/dL — ABNORMAL HIGH (ref 0.3–0.9)
Total Protein: 5.1 g/dL — ABNORMAL LOW (ref 6.0–8.3)

## 2014-12-23 LAB — TROPONIN I

## 2014-12-23 LAB — CBC
HEMATOCRIT: 37.5 % — AB (ref 39.0–52.0)
Hemoglobin: 13 g/dL (ref 13.0–17.0)
MCH: 30.2 pg (ref 26.0–34.0)
MCHC: 34.7 g/dL (ref 30.0–36.0)
MCV: 87 fL (ref 78.0–100.0)
PLATELETS: 171 10*3/uL (ref 150–400)
RBC: 4.31 MIL/uL (ref 4.22–5.81)
RDW: 12.8 % (ref 11.5–15.5)
WBC: 6.6 10*3/uL (ref 4.0–10.5)

## 2014-12-23 LAB — SALICYLATE LEVEL

## 2014-12-23 LAB — ACETAMINOPHEN LEVEL: Acetaminophen (Tylenol), Serum: <10 ug/mL — ABNORMAL LOW (ref 10–30)

## 2014-12-23 LAB — TSH: TSH: 1.477 u[IU]/mL (ref 0.350–4.500)

## 2014-12-23 LAB — BLOOD GAS, ARTERIAL
Acid-Base Excess: 0.1 mmol/L (ref 0.0–2.0)
Bicarbonate: 24.9 mEq/L — ABNORMAL HIGH (ref 20.0–24.0)
DRAWN BY: 31814
FIO2: 0.32 %
O2 Saturation: 93.9 %
PATIENT TEMPERATURE: 99.2
TCO2: 22.4 mmol/L (ref 0–100)
pCO2 arterial: 44.4 mmHg (ref 35.0–45.0)
pH, Arterial: 7.37 (ref 7.350–7.450)
pO2, Arterial: 77.1 mmHg — ABNORMAL LOW (ref 80.0–100.0)

## 2014-12-23 LAB — PROTIME-INR
INR: 0.97 (ref 0.00–1.49)
Prothrombin Time: 12.9 seconds (ref 11.6–15.2)

## 2014-12-23 LAB — GLUCOSE, CAPILLARY
GLUCOSE-CAPILLARY: 118 mg/dL — AB (ref 70–99)
Glucose-Capillary: 99 mg/dL (ref 70–99)
Glucose-Capillary: 106 mg/dL — ABNORMAL HIGH (ref 70–99)
Glucose-Capillary: 123 mg/dL — ABNORMAL HIGH (ref 70–99)

## 2014-12-23 LAB — ETHANOL: Alcohol, Ethyl (B): 43 mg/dL — ABNORMAL HIGH (ref 0–9)

## 2014-12-23 LAB — LACTIC ACID, PLASMA: LACTIC ACID, VENOUS: 1.7 mmol/L (ref 0.5–2.0)

## 2014-12-23 LAB — MRSA PCR SCREENING: MRSA BY PCR: NEGATIVE

## 2014-12-23 LAB — RAPID STREP SCREEN (MED CTR MEBANE ONLY): Streptococcus, Group A Screen (Direct): NEGATIVE

## 2014-12-23 LAB — CK: Total CK: 201 U/L (ref 7–232)

## 2014-12-23 LAB — I-STAT CG4 LACTIC ACID, ED: Lactic Acid, Venous: 1.65 mmol/L (ref 0.5–2.0)

## 2014-12-23 MED ORDER — PIPERACILLIN-TAZOBACTAM 3.375 G IVPB
3.3750 g | Freq: Three times a day (TID) | INTRAVENOUS | Status: DC
  Administered 2014-12-23 – 2014-12-24 (×3): 3.375 g via INTRAVENOUS
  Filled 2014-12-23 (×3): qty 50

## 2014-12-23 MED ORDER — VANCOMYCIN HCL IN DEXTROSE 1-5 GM/200ML-% IV SOLN
1000.0000 mg | Freq: Three times a day (TID) | INTRAVENOUS | Status: DC
  Administered 2014-12-23 – 2014-12-24 (×3): 1000 mg via INTRAVENOUS
  Filled 2014-12-23 (×3): qty 200

## 2014-12-23 MED ORDER — ONDANSETRON HCL 4 MG PO TABS: 4.0000 mg | ORAL_TABLET | Freq: Four times a day (QID) | ORAL | Status: DC | PRN

## 2014-12-23 MED ORDER — SODIUM CHLORIDE 0.9 % IV SOLN
INTRAVENOUS | Status: DC
  Administered 2014-12-23 – 2014-12-24 (×3): via INTRAVENOUS

## 2014-12-23 MED ORDER — DEXTROSE 50 % IV SOLN
INTRAVENOUS | Status: AC
  Filled 2014-12-23: qty 50

## 2014-12-23 MED ORDER — DEXTROSE 50 % IV SOLN
1.0000 | Freq: Once | INTRAVENOUS | Status: AC
  Administered 2014-12-23: 50 mL via INTRAVENOUS

## 2014-12-23 MED ORDER — LORAZEPAM 1 MG PO TABS
1.0000 mg | ORAL_TABLET | Freq: Four times a day (QID) | ORAL | Status: DC | PRN
  Administered 2014-12-23 – 2014-12-24 (×2): 1 mg via ORAL
  Filled 2014-12-23 (×2): qty 1

## 2014-12-23 MED ORDER — LORAZEPAM 2 MG/ML IJ SOLN
1.0000 mg | Freq: Four times a day (QID) | INTRAMUSCULAR | Status: DC | PRN
  Administered 2014-12-24: 1 mg via INTRAVENOUS
  Filled 2014-12-23: qty 1

## 2014-12-23 MED ORDER — VITAMIN B-1 100 MG PO TABS
100.0000 mg | ORAL_TABLET | Freq: Every day | ORAL | Status: DC
  Administered 2014-12-23 – 2014-12-25 (×3): 100 mg via ORAL
  Filled 2014-12-23 (×3): qty 1

## 2014-12-23 MED ORDER — LORAZEPAM 2 MG/ML IJ SOLN: 0.0000 mg | Freq: Two times a day (BID) | INTRAMUSCULAR | Status: DC

## 2014-12-23 MED ORDER — ONDANSETRON HCL 4 MG/2ML IJ SOLN: 4.0000 mg | Freq: Four times a day (QID) | INTRAMUSCULAR | Status: DC | PRN

## 2014-12-23 MED ORDER — ENOXAPARIN SODIUM 40 MG/0.4ML ~~LOC~~ SOLN
40.0000 mg | SUBCUTANEOUS | Status: DC
  Administered 2014-12-23 – 2014-12-24 (×2): 40 mg via SUBCUTANEOUS
  Filled 2014-12-23 (×3): qty 0.4

## 2014-12-23 MED ORDER — THIAMINE HCL 100 MG/ML IJ SOLN
100.0000 mg | Freq: Every day | INTRAMUSCULAR | Status: DC
  Filled 2014-12-23: qty 1

## 2014-12-23 MED ORDER — LORAZEPAM 2 MG/ML IJ SOLN
0.0000 mg | Freq: Four times a day (QID) | INTRAMUSCULAR | Status: AC
  Administered 2014-12-23: 1 mg via INTRAVENOUS
  Administered 2014-12-23 – 2014-12-24 (×4): 2 mg via INTRAVENOUS
  Administered 2014-12-24: 1 mg via INTRAVENOUS
  Filled 2014-12-23 (×6): qty 1

## 2014-12-23 MED ORDER — THIAMINE HCL 100 MG/ML IJ SOLN
100.0000 mg | Freq: Once | INTRAMUSCULAR | Status: AC
  Administered 2014-12-23: 100 mg via INTRAVENOUS

## 2014-12-23 MED ORDER — DEXTROSE 50 % IV SOLN: 1.0000 | Freq: Once | INTRAVENOUS | Status: AC

## 2014-12-23 MED ORDER — ACETAMINOPHEN 325 MG PO TABS
650.0000 mg | ORAL_TABLET | Freq: Four times a day (QID) | ORAL | Status: DC | PRN
  Administered 2014-12-23 – 2014-12-24 (×2): 650 mg via ORAL
  Filled 2014-12-23 (×2): qty 2

## 2014-12-23 MED ORDER — THIAMINE HCL 100 MG/ML IJ SOLN: 100.0000 mg | Freq: Once | INTRAMUSCULAR | Status: AC

## 2014-12-23 MED ORDER — ACETAMINOPHEN 650 MG RE SUPP: 650.0000 mg | Freq: Four times a day (QID) | RECTAL | Status: DC | PRN

## 2014-12-23 MED ORDER — FOLIC ACID 1 MG PO TABS
1.0000 mg | ORAL_TABLET | Freq: Every day | ORAL | Status: DC
  Administered 2014-12-23 – 2014-12-25 (×3): 1 mg via ORAL
  Filled 2014-12-23 (×3): qty 1

## 2014-12-23 MED ORDER — ADULT MULTIVITAMIN W/MINERALS CH
1.0000 | ORAL_TABLET | Freq: Every day | ORAL | Status: DC
  Administered 2014-12-23 – 2014-12-25 (×3): 1 via ORAL
  Filled 2014-12-23 (×3): qty 1

## 2014-12-23 NOTE — ED Notes (Signed)
Pt experiencing snoring respirations. SPO2 at one point dropped to 78%. Pt placed on 4L [place]. SPO2 rises to 93% with stimulation. Pt becoming more difficult to arouse. PA Elmyra Ricks notified and MD [facility] paged. Will continue to monitor.

## 2014-12-23 NOTE — H&P (Signed)
[facility] History and Physical  RUTLEDGE SELSOR ZOX:[id number] DOB: 1980/11/24 DOA: 12/22/2014  Referring physician:Pisciotta. PCP: No PCP Per Patient no primary care physician.  Chief Complaint: Alcohol intoxication.  HPI: Chad Mclaughlin is a 34 y.o. male with history of alcohol abuse who was brought to the ER by patient's cousin as patient had suicidal ideation and alcohol intoxication. Behavioral health had accepted the patient but patient suddenly became tachycardic and mildly febrile. Lactic as was found to be elevated. Patient after admission denies any suicidal ideation. In the ER blood cultures were drawn and patient was started on empiric antibiotics for possible sepsis. Chest x-ray and UA are unremarkable. Patient later became less responsive. Patient was given Seroquel 100 mg along with Robaxin and Ativan in the ER on admission at the psych unit. Patient's blood sugar was found to be around 77 and a D50 was given after thiamine. Blood sugar improved more than 100 despite the patient was still confused. CT head is pending. ABG done showed pH of 7.37 and PCO2 of 44. On my exam patient is arousable and follows commands. PERRLA is positive.  Review of Systems: As presented in the history of presenting illness, rest negative.  Past Medical History  Diagnosis Date  . Anxiety   . Bipolar affective disorder   . Alcohol abuse   . Depression    History reviewed. No pertinent past surgical history. Social History:  reports that he has quit smoking. He has never used smokeless tobacco. He reports that he drinks about 14.4 oz of alcohol per week. He reports that he does not use illicit drugs. Where does patient live home. Can patient participate in ADLs? Yes.  Allergies  Allergen Reactions  . Shellfish Allergy Anaphylaxis    Family History:  Family History  Problem Relation Age of Onset  . Stroke Other       Prior to Admission medications   Medication Sig Start Date End  Date Taking? Authorizing Provider  gabapentin (NEURONTIN) 300 MG capsule Take 300 mg by mouth at bedtime.   Yes Historical Provider, MD  hydrOXYzine (ATARAX/VISTARIL) 25 MG tablet Take 1 tablet (25 mg total) by mouth every 6 (six) hours as needed for anxiety. 09/26/14  Yes Shuvon B Rankin, NP  lamoTRIgine (LAMICTAL) 25 MG tablet Take 2 tablets (50 mg total) by mouth 2 (two) times daily. For mood stabilization 09/26/14  Yes Shuvon B Rankin, NP  PARoxetine (PAXIL) 30 MG tablet Take 2 tablets (60 mg total) by mouth daily. For depression 09/26/14  Yes Shuvon B Rankin, NP  QUEtiapine (SEROQUEL) 100 MG tablet Take 1 tablet (100 mg total) by mouth at bedtime. For mood stabilization 09/26/14  Yes Shuvon B Rankin, NP  LORazepam (ATIVAN) 1 MG tablet For Alcohol withdrawal Day 1:  Take one tablet (1 mg) four times a day Day 2:  Take one tablet (1 mg) three times a day Day 3:  Take one tablet (1 mg) two times a day Day 4:  Take one table (1 mg) once. Patient not taking: Reported on 09/30/2014 09/26/14   Shuvon B Rankin, NP  Multiple Vitamin (MULTIVITAMIN WITH MINERALS) TABS tablet Take 1 tablet by mouth daily. May purchase from over the counter at yr local pharmacy:  Vitamin supplement Patient not taking: Reported on 09/24/2014 08/16/14   Encarnacion Slates, NP    Physical Exam: Filed Vitals:   12/23/14 0130 12/23/14 0200 12/23/14 0230 12/23/14 0300  BP: 100/52 126/61 126/63 121/70  Pulse: 106 131 104  99  Temp:      TempSrc:      Resp: 25 19 17 22   Weight:      SpO2: 91% 97% 96% 97%     General:  Moderately built and nourished.  Eyes: Anicteric no pallor.  ENT: No discharge from the ears eyes nose and mouth.  Neck: No mass felt. No neck rigidity.  Cardiovascular: S1-S2 heard.  Respiratory: No rhonchi or crepitations.  Abdomen: Soft nontender bowel sounds present.  Skin: No rash.  Musculoskeletal: No edema.  Psychiatric: Patient at this time is mildly confused.  Neurologic: Patient is drowsy  and confused. Follows all commands and moves all extremities 5 x 5. Perla positive. No facial asymmetric.  Labs on Admission:  Basic Metabolic Panel:  Recent Labs Lab 12/22/14 1411 12/22/14 2306 12/23/14 0226  NA 139  --  140  K 4.2  --  3.9  CL 99  --  109  CO2 25  --  25  GLUCOSE 109*  --  86  BUN 8  --  9  CREATININE 0.76  --  0.94  CALCIUM 8.4  --  7.3*  MG  --  1.9  --    Liver Function Tests:  Recent Labs Lab 12/22/14 1411  AST 84*  ALT 57*  ALKPHOS 46  BILITOT 1.3*  PROT 7.2  ALBUMIN 4.5   No results for input(s): LIPASE, AMYLASE in the last 168 hours.  Recent Labs Lab 12/23/14 0226  AMMONIA 21   CBC:  Recent Labs Lab 12/22/14 1411  WBC 9.8  NEUTROABS 8.4*  HGB 17.3*  HCT 47.8  MCV 84.9  PLT 286   Cardiac Enzymes:  Recent Labs Lab 12/23/14 0226  CKTOTAL 201  TROPONINI <0.03    BNP (last 3 results) No results for input(s): BNP in the last 8760 hours.  ProBNP (last 3 results) No results for input(s): PROBNP in the last 8760 hours.  CBG:  Recent Labs Lab 12/22/14 2248 12/23/14 0226 12/23/14 0310  GLUCAP 227* 77 159*    Radiological Exams on Admission: Dg Chest Port 1 View  12/23/2014   CLINICAL DATA:  Medical clearance. Alcohol abuse. Initial encounter.  EXAM: PORTABLE CHEST - 1 VIEW  COMPARISON:  Chest radiograph performed 08/29/2014  FINDINGS: The lungs are well-aerated. Pulmonary vascularity is at the upper limits of normal. There is no evidence of focal opacification, pleural effusion or pneumothorax.  The cardiomediastinal silhouette is within normal limits. No acute osseous abnormalities are seen.  IMPRESSION: No acute cardiopulmonary process seen.   Electronically Signed   By: Garald Balding M.D.   On: 12/23/2014 00:40    EKG: Independently reviewed. Sinus tachycardia.  Assessment/Plan Principal Problem:   SIRS (systemic inflammatory response syndrome) Active Problems:   Alcohol withdrawal   Acute  encephalopathy   1. SIRS probably from alcohol withdrawal - patient was found to be mildly febrile and tachycardic with leukocytosis and elevated lactic acid level. Patient was placed on empiric antibiotics for possible developing sepsis. Blood cultures were drawn along with urine cultures. At this time will continue empiric antibiotics and follow cultures check pro-calcitonin levels. Continue with IV hydration. Patient has been placed on alcohol withdrawal protocol. Ammonia level is normal. ABG noted. 2. Acute encephalopathy - patient did receive Seroquel Robaxin and Ativan in the ER. Patient was mildly hypoglycemic and following giving D50 patient is still confused despite his blood sugar increasing 100. Patient's confusion may be related to medication and also alcohol intoxication. Closely follow CBGs.  CT head is pending. 3. Alcohol intoxication with withdrawal - patient has been placed on alcohol withdrawal protocol. 4. Mildly elevated LFTs - probably secondary to alcoholism. Follow LFTs closely.   DVT Prophylaxis Lovenox if CT head is negative for anything acute and if INR is unremarkable.  Code Status: Full code.  Family Communication: I was unable to reach patient's cousin who dropped the patient.  Disposition Plan: Admit to inpatient. Likely stage 2-3 days.    KAKRAKANDY,ARSHAD N. [facility] Pager [phone].  If 7PM-7AM, please contact night-coverage [email or web address] Password [facility] 12/23/2014, 3:31 AM

## 2014-12-23 NOTE — ED Notes (Signed)
Pt arouses to name. Pt transported to CT. CBG 159 on ED Glucometer.

## 2014-12-23 NOTE — ED Notes (Signed)
Respiratory at bedside.

## 2014-12-23 NOTE — ED Notes (Addendum)
PA at bedside.  Sternal rubs used to arouse patient.Attempted to get patient up to ambulate in hopes it will make patient more awake. Pt having difficulties maintaining balance while sitting on side of bed. Pt was assisted back in bed. Pt answers questions appropriately however he drifts off to sleep while attempting to answer.

## 2014-12-23 NOTE — ED Notes (Signed)
Attempted to call report to ICU. RN unable to take report at this time. 

## 2014-12-23 NOTE — ED Notes (Signed)
MD Everlene Other at bedside

## 2014-12-23 NOTE — Progress Notes (Addendum)
[facility] Progress Note   Chad Mclaughlin CXK:[id number] DOB: [birth year] DOA: 12/22/2014 PCP: No PCP Per Patient  Brief narrative: Chad Mclaughlin is a 34 y.o. male with a history of alcohol abuse who got drunk and expressed suicidal ideation and therefore was brought to the ER by his cousin. He was accepted by behavioral health but became tachycardic and febrile and developed an elevated lactic acid and therefore was referred for admission. He subsequently became less responsive after receiving Seroquel 100 mg, Robaxin and Ativan in the ER.   Subjective: Awake and alert. States that he has no intention to commit suicide. He would like to detox from alcohol. Currently is complaining of anxiety and feeling shaky. No nausea and no hallucinations. No complaints of cough, sinus drainage or sinus pain. Does have a mild sore throat. No dysuria and no diarrhea.  Assessment/Plan: Principal Problem:   SIRS (systemic inflammatory response syndrome) -We'll check a rapid strep as he complains of a sore throat-CT of the head reveals mucosal thickening in the right maxillary sinus but he has no symptoms of acute sinusitis-no other signs of infection -He is currently receiving vancomycin and Zosyn-blood cultures negative-lactic acid normalized, UA negative and chest x-ray negative   Active Problems: Bipolar disorder -Continue home medications which include Neurontin, hydroxyzine, Lamictal, Paxil, Seroquel  Elevated LFTs Likely secondary to alcohol use-we'll recheck tomorrow-    Alcohol withdrawal -Continue CIWA scale    Acute encephalopathy -due to alcohol abuse and resolved now  Code Status: Full code Family Communication:  Disposition Plan: Home when stable DVT prophylaxis: Lovenox Consultants: Procedures:  Antibiotics: Anti-infectives    Start     Dose/Rate Route Frequency Ordered Stop   12/23/14 0800  vancomycin (VANCOCIN) IVPB 1000 mg/200 mL premix  Status:  Discontinued      1,000 mg 200 mL/hr over 60 Minutes Intravenous Every 8 hours 12/22/14 2343 12/23/14 0419   12/23/14 0800  piperacillin-tazobactam (ZOSYN) IVPB 3.375 g  Status:  Discontinued     3.375 g 12.5 mL/hr over 240 Minutes Intravenous Every 8 hours 12/22/14 2344 12/23/14 0419   12/23/14 0800  piperacillin-tazobactam (ZOSYN) IVPB 3.375 g     3.375 g 12.5 mL/hr over 240 Minutes Intravenous Every 8 hours 12/23/14 0424     12/23/14 0800  vancomycin (VANCOCIN) IVPB 1000 mg/200 mL premix     1,000 mg 200 mL/hr over 60 Minutes Intravenous Every 8 hours 12/23/14 0424     12/22/14 2330  piperacillin-tazobactam (ZOSYN) IVPB 3.375 g     3.375 g 100 mL/hr over 30 Minutes Intravenous  Once 12/22/14 2324 12/23/14 0040   12/22/14 2330  vancomycin (VANCOCIN) IVPB 1000 mg/200 mL premix     1,000 mg 200 mL/hr over 60 Minutes Intravenous  Once 12/22/14 2324 12/23/14 0040      Objective: Filed Weights   12/22/14 2333  Weight: 67.1 kg (147 lb 14.9 oz)    Intake/Output Summary (Last 24 hours) at 12/23/14 1614 Last data filed at 12/23/14 1319  Gross per 24 hour  Intake    735 ml  Output    750 ml  Net    -15 ml     Vitals Filed Vitals:   12/23/14 1100 12/23/14 1200 12/23/14 1300 12/23/14 1400  BP: 123/81 120/90 140/96 134/93  Pulse: 86 90 107 106  Temp:  98.3 F (36.8 C)    TempSrc:  Oral    Resp: 22 24 17 20   Height:      Weight:  SpO2: 86% 92% 95% 96%    Exam:  General:  Pt is alert, not in acute distress  HEENT: No icterus, No thrush  Cardiovascular: regular rate and rhythm, S1/S2 No murmur  Respiratory: clear to auscultation bilaterally   Abdomen: Soft, +Bowel sounds, non tender, non distended, no guarding  MSK: No LE edema, cyanosis or clubbing  Data Reviewed: Basic Metabolic Panel:  Recent Labs Lab 12/22/14 1411 12/22/14 2306 12/23/14 0226 12/23/14 0650  NA 139  --  140 138  K 4.2  --  3.9 4.2  CL 99  --  109 108  CO2 25  --  25 27  GLUCOSE 109*  --  86 104*  BUN  8  --  9 10  CREATININE 0.76  --  0.94 0.86  CALCIUM 8.4  --  7.3* 7.5*  MG  --  1.9  --   --    Liver Function Tests:  Recent Labs Lab 12/22/14 1411 12/23/14 0650  AST 84* 70*  ALT 57* 46  ALKPHOS 46 33*  BILITOT 1.3* 1.7*  PROT 7.2 5.1*  ALBUMIN 4.5 3.1*   No results for input(s): LIPASE, AMYLASE in the last 168 hours.  Recent Labs Lab 12/23/14 0226  AMMONIA 21   CBC:  Recent Labs Lab 12/22/14 1411 12/23/14 0650  WBC 9.8 6.6  NEUTROABS 8.4*  --   HGB 17.3* 13.0  HCT 47.8 37.5*  MCV 84.9 87.0  PLT 286 171   Cardiac Enzymes:  Recent Labs Lab 12/23/14 0226  CKTOTAL 201  TROPONINI <0.03   BNP (last 3 results) No results for input(s): BNP in the last 8760 hours.  ProBNP (last 3 results) No results for input(s): PROBNP in the last 8760 hours.  CBG:  Recent Labs Lab 12/22/14 2248 12/23/14 0226 12/23/14 0310  GLUCAP 227* 77 159*    Recent Results (from the past 240 hour(s))  MRSA PCR Screening     Status: None   Collection Time: 12/23/14  4:20 AM  Result Value Ref Range Status   MRSA by PCR NEGATIVE NEGATIVE Final    Comment:        The GeneXpert MRSA Assay (FDA approved for NASAL specimens only), is one component of a comprehensive MRSA colonization surveillance program. It is not intended to diagnose MRSA infection nor to guide or monitor treatment for MRSA infections.      Studies: Ct Head Wo Contrast  12/23/2014   CLINICAL DATA:  Acute onset of fever.  Lethargy.  Initial encounter.  EXAM: CT HEAD WITHOUT CONTRAST  TECHNIQUE: Contiguous axial images were obtained from the base of the skull through the vertex without intravenous contrast.  COMPARISON:  None.  FINDINGS: There is no evidence of acute infarction, mass lesion, or intra- or extra-axial hemorrhage on CT.  The posterior fossa, including the cerebellum, brainstem and fourth ventricle, is within normal limits. The third and lateral ventricles, and basal ganglia are unremarkable in  appearance. The cerebral hemispheres are symmetric in appearance, with normal gray-white differentiation. No mass effect or midline shift is seen.  There is no evidence of fracture; visualized osseous structures are unremarkable in appearance. The visualized portions of the orbits are within normal limits. Mucosal thickening is noted at the right maxillary sinus. The remaining paranasal sinuses and mastoid air cells are well-aerated. No significant soft tissue abnormalities are seen.  IMPRESSION: 1. No acute intracranial pathology seen on CT. 2. Mucosal thickening at the right maxillary sinus.   Electronically Signed   By:  Garald Balding M.D.   On: 12/23/2014 04:16   Dg Chest Port 1 View  12/23/2014   CLINICAL DATA:  Medical clearance. Alcohol abuse. Initial encounter.  EXAM: PORTABLE CHEST - 1 VIEW  COMPARISON:  Chest radiograph performed 08/29/2014  FINDINGS: The lungs are well-aerated. Pulmonary vascularity is at the upper limits of normal. There is no evidence of focal opacification, pleural effusion or pneumothorax.  The cardiomediastinal silhouette is within normal limits. No acute osseous abnormalities are seen.  IMPRESSION: No acute cardiopulmonary process seen.   Electronically Signed   By: Garald Balding M.D.   On: 12/23/2014 00:40    Scheduled Meds:  Scheduled Meds: . enoxaparin (LOVENOX) injection  40 mg Subcutaneous Q24H  . folic acid  1 mg Oral Daily  . LORazepam  0-4 mg Intravenous Q6H   Followed by  . [START ON 12/25/2014] LORazepam  0-4 mg Intravenous Q12H  . multivitamin with minerals  1 tablet Oral Daily  . piperacillin-tazobactam (ZOSYN)  IV  3.375 g Intravenous Q8H  . thiamine  100 mg Oral Daily   Or  . thiamine  100 mg Intravenous Daily  . vancomycin  1,000 mg Intravenous Q8H   Continuous Infusions: . sodium chloride 100 mL/hr at 12/23/14 3785    Time spent on care of this patient: 35 minutes   [place], MD 12/23/2014, 4:14 PM  LOS: 0 days   [facility] Office  [phone] Pager - Text Page per [email or web address]  If 7PM-7AM, please contact night-coverage [email or web address]

## 2014-12-24 DIAGNOSIS — F329 Major depressive disorder, single episode, unspecified: Secondary | ICD-10-CM

## 2014-12-24 DIAGNOSIS — Y909 Presence of alcohol in blood, level not specified: Secondary | ICD-10-CM

## 2014-12-24 DIAGNOSIS — F1010 Alcohol abuse, uncomplicated: Secondary | ICD-10-CM

## 2014-12-24 DIAGNOSIS — F10230 Alcohol dependence with withdrawal, uncomplicated: Secondary | ICD-10-CM

## 2014-12-24 LAB — URINE CULTURE
Colony Count: NO GROWTH
Culture: NO GROWTH

## 2014-12-24 LAB — GLUCOSE, CAPILLARY
GLUCOSE-CAPILLARY: 108 mg/dL — AB (ref 70–99)
GLUCOSE-CAPILLARY: 96 mg/dL (ref 70–99)
Glucose-Capillary: 99 mg/dL (ref 70–99)
Glucose-Capillary: 105 mg/dL — ABNORMAL HIGH (ref 70–99)

## 2014-12-24 LAB — COMPREHENSIVE METABOLIC PANEL
ALBUMIN: 3.4 g/dL — AB (ref 3.5–5.2)
ALK PHOS: 44 U/L (ref 39–117)
ALT: 51 U/L (ref 0–53)
AST: 62 U/L — ABNORMAL HIGH (ref 0–37)
Anion gap: 8 (ref 5–15)
BILIRUBIN TOTAL: 2.4 mg/dL — AB (ref 0.3–1.2)
BUN: 7 mg/dL (ref 6–23)
CO2: 27 mmol/L (ref 19–32)
Calcium: 8.3 mg/dL — ABNORMAL LOW (ref 8.4–10.5)
Chloride: 105 mmol/L (ref 96–112)
Creatinine, Ser: 0.82 mg/dL (ref 0.50–1.35)
Glucose, Bld: 105 mg/dL — ABNORMAL HIGH (ref 70–99)
Potassium: 3.5 mmol/L (ref 3.5–5.1)
Sodium: 140 mmol/L (ref 135–145)
Total Protein: 5.6 g/dL — ABNORMAL LOW (ref 6.0–8.3)

## 2014-12-24 MED ORDER — GABAPENTIN 300 MG PO CAPS
300.0000 mg | ORAL_CAPSULE | Freq: Every day | ORAL | Status: DC
  Administered 2014-12-24: 300 mg via ORAL
  Filled 2014-12-24 (×2): qty 1

## 2014-12-24 MED ORDER — HYDROXYZINE HCL 25 MG PO TABS
25.0000 mg | ORAL_TABLET | Freq: Four times a day (QID) | ORAL | Status: DC | PRN
Start: 2014-12-24 — End: 2014-12-25
  Administered 2014-12-24: 25 mg via ORAL
  Filled 2014-12-24: qty 1

## 2014-12-24 MED ORDER — QUETIAPINE FUMARATE 100 MG PO TABS
100.0000 mg | ORAL_TABLET | Freq: Every day | ORAL | Status: DC
  Administered 2014-12-24: 100 mg via ORAL
  Filled 2014-12-24 (×2): qty 1

## 2014-12-24 MED ORDER — LAMOTRIGINE 25 MG PO TABS
50.0000 mg | ORAL_TABLET | Freq: Two times a day (BID) | ORAL | Status: DC
  Administered 2014-12-24 – 2014-12-25 (×3): 50 mg via ORAL
  Filled 2014-12-24 (×5): qty 2

## 2014-12-24 MED ORDER — PAROXETINE HCL 30 MG PO TABS
60.0000 mg | ORAL_TABLET | Freq: Every day | ORAL | Status: DC
  Administered 2014-12-24 – 2014-12-25 (×2): 60 mg via ORAL
  Filled 2014-12-24: qty 3
  Filled 2014-12-24: qty 2

## 2014-12-24 NOTE — Consult Note (Signed)
[facility] Consult   Reason for Consult:  Depression and alcohol abuse vs dependence Referring Physician:  Dr. Wynelle Cleveland Patient Identification: Chad Mclaughlin MRN:  [id number] Principal Diagnosis: SIRS (systemic inflammatory response syndrome) Diagnosis:   Patient Active Problem List   Diagnosis Date Noted  . SIRS (systemic inflammatory response syndrome) [A41.9] 12/23/2014  . Acute encephalopathy [G93.40] 12/23/2014  . Alcohol intoxication [F10.129]   . Alcohol dependence with uncomplicated withdrawal [I77.824]   . Substance induced mood disorder [F19.94] 08/13/2014  . GAD (generalized anxiety disorder) [F41.1] 06/24/2014  . Alcohol dependence [F10.20] 06/22/2014  . Alcohol withdrawal [F10.239] 05/22/2014  . Other pancytopenia [D61.818] 05/22/2014  . Hypokalemia [E87.6] 05/22/2014  . Alcoholic ketoacidosis [M35.3] 05/20/2014  . Nausea and vomiting [R11.2] 05/20/2014    Total Time spent with patient: 45 minutes  Subjective:   Chad Mclaughlin is a 34 y.o. male patient admitted with alcohol abuse and depression.  HPI:  Chad Mclaughlin is a 34 years old male, seen at the psychiatric social service and chart reviewed for psychiatric consultation and evaluation of bipolar disorder with a depressed mood and also history of alcohol dependence and recent relapse. Patient reported he was admitted to behavioral [facility] multiple times including last episode 2 months ago. Patient has completed alcohol rehabilitation treatment at [facility] about 10 days ago and then doing well in [place]. Patient reportedly relapse when he found a male from [place] was in trouble secondary to being in the relationship with him while in the program. Patient helped her to go to [place] and stayed with her while drinking alcohol. Patient was not allowed to be good there as soon as her father found her with him. Patient reportedly went to a hotel sobered up overnight and then came  to the [facility] emergency department for requesting alcohol detox treatment again. Patient reportedly has no outpatient psychiatric services but has been receiving medication management for bipolar depression while living in the facilities. Patient mother and father seems to be supportive to him. Patient was separated from his wife and has a plan about getting together. Patient cannot explain is nutritional and impulsive behaviors and relapse of drinking alcohol after successfully completing [facility] rehabilitation program. Patient feeling remorse about his behavior and relapse on alcoholism. Patient denies current suicidal, homicidal ideation, intention or plans. Patient is hoping to be able to participate in outpatient substance abuse treatment when he completed detox treatment and medically stable.  Medical history: Chad Mclaughlin is a 34 y.o. male with history of alcohol abuse who was brought to the ER by patient's cousin as patient had suicidal ideation and alcohol intoxication. Behavioral health had accepted the patient but patient suddenly became tachycardic and mildly febrile. Lactic as was found to be elevated. Patient after admission denies any suicidal ideation. In the ER blood cultures were drawn and patient was started on empiric antibiotics for possible sepsis. Chest x-ray and UA are unremarkable. Patient later became less responsive. Patient was given Seroquel 100 mg along with Robaxin and Ativan in the ER on admission at the psych unit. Patient's blood sugar was found to be around 77 and a D50 was given after thiamine. Blood sugar improved more than 100 despite the patient was still confused. CT head is pending. ABG done showed pH of 7.37 and PCO2 of 44. On my exam patient is arousable and follows commands. PERRLA is positive.  Review of Systems: As presented in the history of presenting illness, rest negative.  HPI Elements:   Location:  Bipolar disorder alcohol dependence. Quality:   Fair to poor. Severity:  Recent relapse and withdrawal symptoms. Timing:  Met with a girl in treatment Center. Duration:  10 days. Context:  Psychosocial stresses.  Past Medical History:  Past Medical History  Diagnosis Date  . Anxiety   . Bipolar affective disorder   . Alcohol abuse   . Depression    History reviewed. No pertinent past surgical history. Family History:  Family History  Problem Relation Age of Onset  . Stroke Other    Social History:  History  Alcohol Use  . 14.4 oz/week  . 24 Cans of beer per week    Comment: daily      History  Drug Use No    History   Social History  . Marital Status: Married    Spouse Name: [name]  . Number of Children: [name]  . Years of Education: [name]   Social History Main Topics  . Smoking status: Former Research scientist (life sciences)  . Smokeless tobacco: Never Used  . Alcohol Use: 14.4 oz/week    24 Cans of beer per week     Comment: daily   . Drug Use: No  . Sexual Activity: No   Other Topics Concern  . None   Social History Narrative   Additional Social History:    History of alcohol / drug use?: Yes Longest period of sobriety (when/how long): 2 months while in treatment  Name of Substance 1: Alcohol  1 - Age of First Use: 15 1 - Amount (size/oz): 1 pint  1 - Frequency: daily  1 - Duration: 7 day  1 - Last Use / Amount: 12-22-14 [id number]                   Allergies:   Allergies  Allergen Reactions  . Shellfish Allergy Anaphylaxis    Labs:  Results for orders placed or performed during the hospital encounter of 12/22/14 (from the past 48 hour(s))  CBG monitoring, ED     Status: Abnormal   Collection Time: 12/22/14 10:48 PM  Result Value Ref Range   Glucose-Capillary 227 (H) 70 - 99 mg/dL  Magnesium     Status: None   Collection Time: 12/22/14 11:06 PM  Result Value Ref Range   Magnesium 1.9 1.5 - 2.5 mg/dL  I-Stat CG4 Lactic Acid, ED     Status: Abnormal   Collection Time: 12/22/14 11:13 PM  Result Value Ref Range    Lactic Acid, Venous 3.50 (HH) 0.5 - 2.0 mmol/L  Blood culture (routine x 2)     Status: None (Preliminary result)   Collection Time: 12/22/14 11:25 PM  Result Value Ref Range   Specimen Description BLOOD LEFT ANTECUBITAL    Special Requests BOTTLES DRAWN AEROBIC AND ANAEROBIC 5CC    Culture             BLOOD CULTURE RECEIVED NO GROWTH TO DATE CULTURE WILL BE HELD FOR 5 DAYS BEFORE ISSUING A FINAL NEGATIVE REPORT Performed at [facility]    Report Status PENDING   Blood culture (routine x 2)     Status: None (Preliminary result)   Collection Time: 12/22/14 11:25 PM  Result Value Ref Range   Specimen Description BLOOD LEFT HAND    Special Requests BOTTLES DRAWN AEROBIC AND ANAEROBIC 5CC    Culture             BLOOD CULTURE RECEIVED NO GROWTH TO DATE CULTURE WILL BE  HELD FOR 5 DAYS BEFORE ISSUING A FINAL NEGATIVE REPORT Performed at [facility]    Report Status PENDING   Urinalysis, Routine w reflex microscopic     Status: Abnormal   Collection Time: 12/23/14 12:38 AM  Result Value Ref Range   Color, Urine YELLOW YELLOW   APPearance CLEAR CLEAR   Specific Gravity, Urine 1.022 1.005 - 1.030   pH 6.5 5.0 - 8.0   Glucose, UA 100 (A) NEGATIVE mg/dL   Hgb urine dipstick NEGATIVE NEGATIVE   Bilirubin Urine NEGATIVE NEGATIVE   Ketones, ur NEGATIVE NEGATIVE mg/dL   Protein, ur NEGATIVE NEGATIVE mg/dL   Urobilinogen, UA 1.0 0.0 - 1.0 mg/dL   Nitrite NEGATIVE NEGATIVE   Leukocytes, UA NEGATIVE NEGATIVE    Comment: MICROSCOPIC NOT DONE ON URINES WITH NEGATIVE PROTEIN, BLOOD, LEUKOCYTES, NITRITE, OR GLUCOSE <1000 mg/dL.  Urine culture     Status: None   Collection Time: 12/23/14 12:38 AM  Result Value Ref Range   Specimen Description URINE, RANDOM    Special Requests NONE    Colony Count NO GROWTH Performed at [facility]     Culture NO GROWTH Performed at [facility]     Report Status 12/24/2014 FINAL   I-Stat CG4 Lactic Acid, ED     Status: None    Collection Time: 12/23/14  2:08 AM  Result Value Ref Range   Lactic Acid, Venous 1.65 0.5 - 2.0 mmol/L  Blood gas, arterial ([facility] & [facility] ONLY)     Status: Abnormal   Collection Time: 12/23/14  2:08 AM  Result Value Ref Range   FIO2 0.32 %   Delivery systems NASAL CANNULA    pH, Arterial 7.370 7.350 - 7.450   pCO2 arterial 44.4 35.0 - 45.0 mmHg   pO2, Arterial 77.1 (L) 80.0 - 100.0 mmHg   Bicarbonate 24.9 (H) 20.0 - 24.0 mEq/L   TCO2 22.4 0 - 100 mmol/L   Acid-Base Excess 0.1 0.0 - 2.0 mmol/L   O2 Saturation 93.9 %   Patient temperature 99.2    Collection site RIGHT RADIAL    Drawn by [phone]    Sample type ARTERIAL    Allens test (pass/fail) PASS PASS  Acetaminophen level     Status: Abnormal   Collection Time: 12/23/14  2:08 AM  Result Value Ref Range   Acetaminophen (Tylenol), Serum <10.0 (L) 10 - 30 ug/mL    Comment:        THERAPEUTIC CONCENTRATIONS VARY SIGNIFICANTLY. A RANGE OF 10-30 ug/mL MAY BE AN EFFECTIVE CONCENTRATION FOR MANY PATIENTS. HOWEVER, SOME ARE BEST TREATED AT CONCENTRATIONS OUTSIDE THIS RANGE. ACETAMINOPHEN CONCENTRATIONS >150 ug/mL AT 4 HOURS AFTER INGESTION AND >50 ug/mL AT 12 HOURS AFTER INGESTION ARE OFTEN ASSOCIATED WITH TOXIC REACTIONS.   Salicylate level     Status: None   Collection Time: 12/23/14  2:08 AM  Result Value Ref Range   Salicylate Lvl <9.3 2.8 - 20.0 mg/dL  Ammonia     Status: None   Collection Time: 12/23/14  2:26 AM  Result Value Ref Range   Ammonia 21 11 - 32 umol/L  Lactic acid, plasma     Status: None   Collection Time: 12/23/14  2:26 AM  Result Value Ref Range   Lactic Acid, Venous 1.7 0.5 - 2.0 mmol/L  TSH     Status: None   Collection Time: 12/23/14  2:26 AM  Result Value Ref Range   TSH 1.477 0.350 - 4.500 uIU/mL  Basic metabolic panel  Status: Abnormal   Collection Time: 12/23/14  2:26 AM  Result Value Ref Range   Sodium 140 135 - 145 mmol/L   Potassium 3.9 3.5 - 5.1 mmol/L   Chloride 109 96 - 112 mmol/L     Comment: DELTA CHECK NOTED   CO2 25 19 - 32 mmol/L   Glucose, Bld 86 70 - 99 mg/dL   BUN 9 6 - 23 mg/dL   Creatinine, Ser 0.94 0.50 - 1.35 mg/dL   Calcium 7.3 (L) 8.4 - 10.5 mg/dL   GFR calc non Af Amer >90 >90 mL/min   GFR calc Af Amer >90 >90 mL/min    Comment: (NOTE) The eGFR has been calculated using the CKD EPI equation. This calculation has not been validated in all clinical situations. eGFR's persistently <90 mL/min signify possible Chronic Kidney Disease.    Anion gap 6 5 - 15  Troponin I     Status: None   Collection Time: 12/23/14  2:26 AM  Result Value Ref Range   Troponin I <0.03 <0.031 ng/mL    Comment:        NO INDICATION OF MYOCARDIAL INJURY.   CK     Status: None   Collection Time: 12/23/14  2:26 AM  Result Value Ref Range   Total CK 201 7 - 232 U/L  Ethanol     Status: Abnormal   Collection Time: 12/23/14  2:26 AM  Result Value Ref Range   Alcohol, Ethyl (B) 43 (H) 0 - 9 mg/dL    Comment:        LOWEST DETECTABLE LIMIT FOR SERUM ALCOHOL IS 11 mg/dL FOR MEDICAL PURPOSES ONLY   Procalcitonin - Baseline     Status: None   Collection Time: 12/23/14  2:26 AM  Result Value Ref Range   Procalcitonin <0.10 ng/mL    Comment:        Interpretation: PCT (Procalcitonin) <= 0.5 ng/mL: Systemic infection (sepsis) is not likely. Local bacterial infection is possible. (NOTE)         ICU PCT Algorithm               Non ICU PCT Algorithm    ----------------------------     ------------------------------         PCT < 0.25 ng/mL                 PCT < 0.1 ng/mL     Stopping of antibiotics            Stopping of antibiotics       strongly encouraged.               strongly encouraged.    ----------------------------     ------------------------------       PCT level decrease by               PCT < 0.25 ng/mL       >= 80% from peak PCT       OR PCT 0.25 - 0.5 ng/mL          Stopping of antibiotics                                             encouraged.      Stopping of antibiotics           encouraged.    ----------------------------     ------------------------------  PCT level decrease by              PCT >= 0.25 ng/mL       < 80% from peak PCT        AND PCT >= 0.5 ng/mL            Continuin g antibiotics                                              encouraged.       Continuing antibiotics            encouraged.    ----------------------------     ------------------------------     PCT level increase compared          PCT > 0.5 ng/mL         with peak PCT AND          PCT >= 0.5 ng/mL             Escalation of antibiotics                                          strongly encouraged.      Escalation of antibiotics        strongly encouraged.   POC CBG, ED     Status: None   Collection Time: 12/23/14  2:26 AM  Result Value Ref Range   Glucose-Capillary 77 70 - 99 mg/dL  POC CBG, ED     Status: Abnormal   Collection Time: 12/23/14  3:10 AM  Result Value Ref Range   Glucose-Capillary 159 (H) 70 - 99 mg/dL  MRSA PCR Screening     Status: None   Collection Time: 12/23/14  4:20 AM  Result Value Ref Range   MRSA by PCR NEGATIVE NEGATIVE    Comment:        The GeneXpert MRSA Assay (FDA approved for NASAL specimens only), is one component of a comprehensive MRSA colonization surveillance program. It is not intended to diagnose MRSA infection nor to guide or monitor treatment for MRSA infections.   Glucose, capillary     Status: None   Collection Time: 12/23/14  6:24 AM  Result Value Ref Range   Glucose-Capillary 99 70 - 99 mg/dL  Basic metabolic panel     Status: Abnormal   Collection Time: 12/23/14  6:50 AM  Result Value Ref Range   Sodium 138 135 - 145 mmol/L   Potassium 4.2 3.5 - 5.1 mmol/L   Chloride 108 96 - 112 mmol/L   CO2 27 19 - 32 mmol/L   Glucose, Bld 104 (H) 70 - 99 mg/dL   BUN 10 6 - 23 mg/dL   Creatinine, Ser 0.86 0.50 - 1.35 mg/dL   Calcium 7.5 (L) 8.4 - 10.5 mg/dL   GFR calc non Af Amer >90 >90  mL/min   GFR calc Af Amer >90 >90 mL/min    Comment: (NOTE) The eGFR has been calculated using the CKD EPI equation. This calculation has not been validated in all clinical situations. eGFR's persistently <90 mL/min signify possible Chronic Kidney Disease.    Anion gap 3 (L) 5 - 15  CBC     Status: Abnormal   Collection Time: 12/23/14  6:50 AM  Result  Value Ref Range   WBC 6.6 4.0 - 10.5 K/uL   RBC 4.31 4.22 - 5.81 MIL/uL   Hemoglobin 13.0 13.0 - 17.0 g/dL    Comment: REPEATED TO VERIFY DELTA CHECK NOTED    HCT 37.5 (L) 39.0 - 52.0 %   MCV 87.0 78.0 - 100.0 fL   MCH 30.2 26.0 - 34.0 pg   MCHC 34.7 30.0 - 36.0 g/dL   RDW 12.8 11.5 - 15.5 %   Platelets 171 150 - 400 K/uL    Comment: REPEATED TO VERIFY DELTA CHECK NOTED   Hepatic function panel     Status: Abnormal   Collection Time: 12/23/14  6:50 AM  Result Value Ref Range   Total Protein 5.1 (L) 6.0 - 8.3 g/dL   Albumin 3.1 (L) 3.5 - 5.2 g/dL   AST 70 (H) 0 - 37 U/L   ALT 46 0 - 53 U/L   Alkaline Phosphatase 33 (L) 39 - 117 U/L   Total Bilirubin 1.7 (H) 0.3 - 1.2 mg/dL   Bilirubin, Direct 0.2 0.0 - 0.5 mg/dL   Indirect Bilirubin 1.5 (H) 0.3 - 0.9 mg/dL  Glucose, capillary     Status: Abnormal   Collection Time: 12/23/14  8:21 AM  Result Value Ref Range   Glucose-Capillary 106 (H) 70 - 99 mg/dL   Comment 1 Notify RN    Comment 2 Document in Chart   Protime-INR     Status: None   Collection Time: 12/23/14  8:22 AM  Result Value Ref Range   Prothrombin Time 12.9 11.6 - 15.2 seconds   INR 0.97 0.00 - 1.49  Glucose, capillary     Status: Abnormal   Collection Time: 12/23/14  1:14 PM  Result Value Ref Range   Glucose-Capillary 123 (H) 70 - 99 mg/dL  Glucose, capillary     Status: Abnormal   Collection Time: 12/23/14  4:04 PM  Result Value Ref Range   Glucose-Capillary 118 (H) 70 - 99 mg/dL   Comment 1 Notify RN    Comment 2 Document in Chart   Rapid strep screen     Status: None   Collection Time: 12/23/14  4:31  PM  Result Value Ref Range   Streptococcus, Group A Screen (Direct) NEGATIVE NEGATIVE    Comment: (NOTE) A Rapid Antigen test may result negative if the antigen level in the sample is below the detection level of this test. The FDA has not cleared this test as a stand-alone test therefore the rapid antigen negative result has reflexed to a Group A Strep culture.   Glucose, capillary     Status: Abnormal   Collection Time: 12/24/14 12:24 AM  Result Value Ref Range   Glucose-Capillary 108 (H) 70 - 99 mg/dL  Comprehensive metabolic panel     Status: Abnormal   Collection Time: 12/24/14  4:23 AM  Result Value Ref Range   Sodium 140 135 - 145 mmol/L   Potassium 3.5 3.5 - 5.1 mmol/L   Chloride 105 96 - 112 mmol/L   CO2 27 19 - 32 mmol/L   Glucose, Bld 105 (H) 70 - 99 mg/dL   BUN 7 6 - 23 mg/dL   Creatinine, Ser 0.82 0.50 - 1.35 mg/dL   Calcium 8.3 (L) 8.4 - 10.5 mg/dL   Total Protein 5.6 (L) 6.0 - 8.3 g/dL   Albumin 3.4 (L) 3.5 - 5.2 g/dL   AST 62 (H) 0 - 37 U/L   ALT 51 0 - 53 U/L  Alkaline Phosphatase 44 39 - 117 U/L   Total Bilirubin 2.4 (H) 0.3 - 1.2 mg/dL   GFR calc non Af Amer >90 >90 mL/min   GFR calc Af Amer >90 >90 mL/min    Comment: (NOTE) The eGFR has been calculated using the CKD EPI equation. This calculation has not been validated in all clinical situations. eGFR's persistently <90 mL/min signify possible Chronic Kidney Disease.    Anion gap 8 5 - 15  Glucose, capillary     Status: None   Collection Time: 12/24/14  4:39 AM  Result Value Ref Range   Glucose-Capillary 96 70 - 99 mg/dL  Glucose, capillary     Status: Abnormal   Collection Time: 12/24/14  7:58 AM  Result Value Ref Range   Glucose-Capillary 105 (H) 70 - 99 mg/dL   Comment 1 Notify RN    Comment 2 Document in Chart     Vitals: Blood pressure 117/76, pulse 94, temperature 98.4 F (36.9 C), temperature source Oral, resp. rate 20, height 5' 5"  (1.651 m), weight 67.1 kg (147 lb 14.9 oz), SpO2 100  %.  Risk to Self: Suicidal Ideation: No Suicidal Intent: No Is patient at risk for suicide?: No Suicidal Plan?: No Access to Means: No What has been your use of drugs/alcohol within the last 12 months?: Daily alcohol use reported.  How many times?: 0 Other Self Harm Risks: No other self harm risk identified.  Triggers for Past Attempts: None known Intentional Self Injurious Behavior: None Risk to Others: Homicidal Ideation: No Thoughts of Harm to Others: No Current Homicidal Intent: No Current Homicidal Plan: No Access to Homicidal Means: No Identified Victim: NA History of harm to others?: No Assessment of Violence: On admission Violent Behavior Description: No violent behaviors reported. Pt is calm and cooperative at this time.  Does patient have access to weapons?: No Criminal Charges Pending?: No Does patient have a court date: No Prior Inpatient Therapy: Prior Inpatient Therapy: Yes Prior Therapy Dates: 1/16, 2/16; 11/15,12/15, 1/16 Prior Therapy Facilty/Provider(s): [facility]; [facility].  Reason for Treatment: Alcohol Abuse Prior Outpatient Therapy: Prior Outpatient Therapy: No  Current Facility-Administered Medications  Medication Dose Route Frequency Provider Last Rate Last Dose  . acetaminophen (TYLENOL) tablet 650 mg  650 mg Oral Q6H PRN Rise Patience, MD   650 mg at 12/24/14 1147   Or  . acetaminophen (TYLENOL) suppository 650 mg  650 mg Rectal Q6H PRN Rise Patience, MD      . enoxaparin (LOVENOX) injection 40 mg  40 mg Subcutaneous Q24H Rise Patience, MD   40 mg at 12/24/14 0932  . folic acid (FOLVITE) tablet 1 mg  1 mg Oral Daily Rise Patience, MD   1 mg at 12/24/14 0930  . gabapentin (NEURONTIN) capsule 300 mg  300 mg Oral QHS Saima Rizwan, MD      . hydrOXYzine (ATARAX/VISTARIL) tablet 25 mg  25 mg Oral Q6H PRN Debbe Odea, MD      . lamoTRIgine (LAMICTAL) tablet 50 mg  50 mg Oral BID Debbe Odea, MD   50 mg at 12/24/14 0930  .  LORazepam (ATIVAN) injection 0-4 mg  0-4 mg Intravenous Q6H Rise Patience, MD   2 mg at 12/24/14 1128   Followed by  . [START ON 12/25/2014] LORazepam (ATIVAN) injection 0-4 mg  0-4 mg Intravenous Q12H Rise Patience, MD      . LORazepam (ATIVAN) tablet 1 mg  1 mg Oral Q6H PRN Jaquita Rector  Aida Puffer, MD   1 mg at 12/23/14 1711   Or  . LORazepam (ATIVAN) injection 1 mg  1 mg Intravenous Q6H PRN Rise Patience, MD   1 mg at 12/24/14 0401  . multivitamin with minerals tablet 1 tablet  1 tablet Oral Daily Rise Patience, MD   1 tablet at 12/24/14 0930  . ondansetron (ZOFRAN) tablet 4 mg  4 mg Oral Q6H PRN Rise Patience, MD       Or  . ondansetron [facility]) injection 4 mg  4 mg Intravenous Q6H PRN Rise Patience, MD      . PARoxetine (PAXIL) tablet 60 mg  60 mg Oral Daily Debbe Odea, MD   60 mg at 12/24/14 0930  . QUEtiapine (SEROQUEL) tablet 100 mg  100 mg Oral QHS Saima Rizwan, MD      . thiamine (VITAMIN B-1) tablet 100 mg  100 mg Oral Daily Rise Patience, MD   100 mg at 12/24/14 0930   Or  . thiamine (B-1) injection 100 mg  100 mg Intravenous Daily Rise Patience, MD        Musculoskeletal: Strength & Muscle Tone: decreased Gait & Station: unable to stand Patient leans: [name]  Psychiatric Specialty Exam: Physical Exam as per history and physical   ROS depression, anxiety, alcohol withdrawal and feeling guilt  Blood pressure 117/76, pulse 94, temperature 98.4 F (36.9 C), temperature source Oral, resp. rate 20, height 5' 5"  (1.651 m), weight 67.1 kg (147 lb 14.9 oz), SpO2 100 %.Body mass index is 24.62 kg/(m^2).  General Appearance: Disheveled and Guarded  Eye Contact::  Good  Speech:  Clear and Coherent  Volume:  Decreased  Mood:  Anxious and Depressed  Affect:  Appropriate and Congruent  Thought Process:  Coherent and Goal Directed  Orientation:  Full (Time, Place, and Person)  Thought Content:  WDL  Suicidal Thoughts:  No  Homicidal  Thoughts:  No  Memory:  Immediate;   Good Recent;   Good  Judgement:  Fair  Insight:  Fair  Psychomotor Activity:  Decreased  Concentration:  Fair  Recall:  Good  Fund of Knowledge:Good  Language: Negative  Akathisia:  Negative  Handed:  Right  AIMS (if indicated):     Assets:  Communication Skills Desire for Improvement Financial Resources/Insurance Intimacy Leisure Time Resilience Social Support  ADL's:  Impaired  Cognition: WNL  Sleep:      Medical Decision Making: Review of Psycho-Social Stressors (1), Review or order clinical lab tests (1), Established Problem, Worsening (2), Review or order medicine tests (1), Review of Medication Regimen & Side Effects (2) and Review of New Medication or Change in Dosage (2)  Treatment Plan Summary: Daily contact with patient to assess and evaluate symptoms and progress in treatment and Medication management  Plan: Continue Ativan alcohol detox treatment and CIWA protocol Continue Seroquel 100 mg at bedtime, Paxil 60 mg daily, Lamictal 50 mg twice daily and Neurontin 300 mg at bedtime  No evidence of imminent risk to self or others at present.   Patient does not meet criteria for psychiatric inpatient admission. Supportive therapy provided about ongoing stressors. Refer to IOP.  Appreciate psychiatric consultation and follow up as clinically required Please contact [phone] or [phone] if needs further assistance   Disposition: Referred to the psychiatric social service regarding appropriate disposition plan and medically stable   Everest Brod,JANARDHAHA R. 12/24/2014 2:22 PM

## 2014-12-24 NOTE — Progress Notes (Signed)
Clinical Social Work Department CLINICAL SOCIAL WORK PSYCHIATRY SERVICE LINE ASSESSMENT 12/24/2014  Patient:  Chad Mclaughlin  Account:  [id number]  Admit Date:  12/22/2014  Clinical Social Worker:  Peri Maris, CLINICAL SOCIAL WORKER  Date/Time:  12/24/2014 03:00 PM Referred by:  Physician  Date referred:  12/24/2014 Reason for Referral  Behavioral Health Issues  Substance Abuse   Presenting Symptoms/Problems (In the person's/family's own words):   Pt reports that he is feeling very depressed an is struggling with alcohol abuse.   Abuse/Neglect/Trauma History (check all that apply)  Denies history   Abuse/Neglect/Trauma Comments:   Psychiatric History (check all that apply)  Inpatient/hospitilization  Residential treatment   Psychiatric medications:  Neurontin 390m  Paxil 617m Seroquel 10062m Current Mental Health Hospitalizations/Previous Mental Health History:   Pt reports alcohol abuse and depression for approximately 2-3 years; has been in inpatient psychiatric facilities and residential substance abuse programs.   Current provider:   Pt reports having no outpatient provider   Place and Date:   [name]   Current Medications:   Scheduled Meds:      . enoxaparin (LOVENOX) injection  40 mg Subcutaneous Q24H  . folic acid  1 mg Oral Daily  . gabapentin  300 mg Oral QHS  . lamoTRIgine  50 mg Oral BID  . LORazepam  0-4 mg Intravenous Q6H   Followed by     . [START ON 12/25/2014] LORazepam  0-4 mg Intravenous Q12H  . multivitamin with minerals  1 tablet Oral Daily  . PARoxetine  60 mg Oral Daily  . QUEtiapine  100 mg Oral QHS  . thiamine  100 mg Oral Daily   Or     . thiamine  100 mg Intravenous Daily        Continuous Infusions:      PRN Meds:.acetaminophen **OR** acetaminophen, hydrOXYzine, LORazepam **OR** LORazepam, ondansetron **OR** ondansetron (ZOFRAN) IV       Previous Impatient Admission/Date/Reason:   05/2014; 07/2014; [facility]    Suicide/Self Harm  None reported   Suicide attempt in the past:   Pt was suicidal on admission when intoxicated; denying SI currently   Other harmful behavior:   [name]   Psychotic/Dissociative Symptoms  None reported   Other Psychotic/Dissociative Symptoms:   [name]    Attention/Behavioral Symptoms  Within Normal Limits   Other Attention / Behavioral Symptoms:   [name]    Cognitive Impairment  Within Normal Limits   Other Cognitive Impairment:   [name]    Mood and Adjustment  Mood Congruent    Stress, Anxiety, Trauma, Any Recent Loss/Stressor  Relationship   Anxiety (frequency):   Phobia (specify):   Compulsive behavior (specify):   Obsessive behavior (specify):   Other:   Pt reports that he has been separated from his wife for 2 months, during this time he has been in [facility]. While in rehab, Pt reports that he had a relationship with another Pt. Pt reports continued distance with his wife and that he is breaking up with the girl he met in rehab.   Substance Abuse/Use  Substance abuse treatment needed  History of substance use  Current substance use   SBIRT completed (please refer for detailed history):  N  Self-reported substance use:   1/5 of liquor; 12/22/14   Urinary Drug Screen Completed:  Y Alcohol level:   .417 on 4/24  .  043 on 4/25    Environmental/Housing/Living Arrangement  HOMELESS   Who is in the home:   [name]   Emergency contact:  Santiago Glad, mother  Simona Huh, father   [place]   Patient's Strengths and Goals (patient's own words):   Pt reports that he is motivated for treatment and that his partents are supportive.   Clinical Social Worker's Interpretive Summary:   Pt presented with mood congruent affect with depressed mood. Pt was cooperative with questions. Pt alert and oriented x4. He discussed his recent substance abuse  stating that he went to [place] for detox in January and from there went to [facility] for continued treatment. He reported finishing the program, but then a woman he formed a relationship with in the program got kicked out so he described going to pick her up. He reported that they went to [place] and were living together for 4 days until the girl's father found out that they were living together and made Pt leave. Pt verbalized that he went to a hotel parking lot to drink and the next morning drove to [place] to get detox. Pt reports that he and his parents have decided to take Pt to one of their homes and find outpatient treatment for Pt. He reports feeling that he needs a "fresh start away from [place]." Pt denies SI/HI and AVH at this time but reports feeling depressed, remorseful, and shameful. CSW will continue following Pt while hospitalized and assist with discharge planning as needed.   Disposition:  Outpatient referral made/needed  Peri Maris, [place] 12/24/2014 5:24 PM [phone]

## 2014-12-24 NOTE — Progress Notes (Addendum)
[facility] Progress Note   Chad Mclaughlin OZH:[id number] DOB: 1980-12-01 DOA: 12/22/2014 PCP: No PCP Per Patient  Brief narrative: Chad Mclaughlin is a 34 y.o. male with a history of alcohol abuse who got drunk and expressed suicidal ideation and therefore was brought to the ER by his cousin. He was accepted by behavioral health but became tachycardic and febrile and developed an elevated lactic acid and therefore was referred for admission. He subsequently became less responsive after receiving Seroquel 100 mg, Robaxin and Ativan in the ER and was admitted to the SDU.   Subjective: Alert. Still having some anxiety and feeling tremulous. No nausea/ vomiting. No hallucinations. Asked again about intentions to commit suicide- he states he has not thoughts of suicide.   Assessment/Plan: Principal Problem:   SIRS (systemic inflammatory response syndrome)- fever of 100.3, tachycardia and lactic acidosis on admission -no symptoms of infection other than sore throat on 4/25- rapid strep negative -CT of the head reveals mucosal thickening in the right maxillary sinus but he has no symptoms of acute sinusitis-no other signs of infection -He is currently receiving vancomycin and Zosyn-blood cultures negative-lactic acid normalized, UA negative and chest x-ray negative - I will stop antibiotics today and will follow for recurrence of fever or development of other symptoms  Active Problems: Bipolar disorder -Continue home medications which include Neurontin, hydroxyzine, Lamictal, Paxil, Seroquel -I have spoken with his mother today who states that the patient has been extremely depressed and she is highly suspicious that he may still have suicidal intentions although he declines to say so-she is asking if he could receive inpatient treatment for his depression -she also states that he has not taken his medications in the past 10 days (which he did not tell me)-I have requested a psychiatric eval  and have communicated directly with the on-call psychiatrist to explain his mother's concerns  Elevated LFTs Likely secondary to alcohol use-T bili rising- recheck tomorrow    Alcohol withdrawal - released [facility] about [facility] wk ago -Continue CIWA scale- suspect he will need one more day and hopefully should be able to go home tomorrow- will ask social work to provide resources for assistance with alcohol abuse     Acute encephalopathy/ alcohol intoxication with delirium -due to alcohol abuse and resolved now  Code Status: Full code Family Communication: with mother Disposition Plan: Home when stable- transfer out of SDU today DVT prophylaxis: Lovenox Consultants: Procedures:  Antibiotics: Anti-infectives    Start     Dose/Rate Route Frequency Ordered Stop   12/23/14 0800  vancomycin (VANCOCIN) IVPB 1000 mg/200 mL premix  Status:  Discontinued     1,000 mg 200 mL/hr over 60 Minutes Intravenous Every 8 hours 12/22/14 2343 12/23/14 0419   12/23/14 0800  piperacillin-tazobactam (ZOSYN) IVPB 3.375 g  Status:  Discontinued     3.375 g 12.5 mL/hr over 240 Minutes Intravenous Every 8 hours 12/22/14 2344 12/23/14 0419   12/23/14 0800  piperacillin-tazobactam (ZOSYN) IVPB 3.375 g  Status:  Discontinued     3.375 g 12.5 mL/hr over 240 Minutes Intravenous Every 8 hours 12/23/14 0424 12/24/14 0721   12/23/14 0800  vancomycin (VANCOCIN) IVPB 1000 mg/200 mL premix  Status:  Discontinued     1,000 mg 200 mL/hr over 60 Minutes Intravenous Every 8 hours 12/23/14 0424 12/24/14 0721   12/22/14 2330  piperacillin-tazobactam (ZOSYN) IVPB 3.375 g     3.375 g 100 mL/hr over 30 Minutes Intravenous  Once 12/22/14 2324 12/23/14 0040  12/22/14 2330  vancomycin (VANCOCIN) IVPB 1000 mg/200 mL premix     1,000 mg 200 mL/hr over 60 Minutes Intravenous  Once 12/22/14 2324 12/23/14 0040      Objective: Filed Weights   12/22/14 2333  Weight: 67.1 kg (147 lb 14.9 oz)    Intake/Output Summary  (Last 24 hours) at 12/24/14 0915 Last data filed at 12/24/14 0700  Gross per 24 hour  Intake   3630 ml  Output   4125 ml  Net   -495 ml     Vitals Filed Vitals:   12/24/14 0400 12/24/14 0401 12/24/14 0600 12/24/14 0742  BP: 131/89 131/89 135/97   Pulse: 66 79 78 67  Temp:  98.3 F (36.8 C)  98.8 F (37.1 C)  TempSrc:  Oral  Oral  Resp: 21  21 16   Height:      Weight:      SpO2: 99%  95% 97%    Exam:  General:  Pt is alert, not in acute distress  HEENT: No icterus, No thrush  Cardiovascular: regular rate and rhythm, S1/S2 No murmur  Respiratory: clear to auscultation bilaterally   Abdomen: Soft, +Bowel sounds, non tender, non distended, no guarding  MSK: No LE edema, cyanosis or clubbing  Data Reviewed: Basic Metabolic Panel:  Recent Labs Lab 12/22/14 1411 12/22/14 2306 12/23/14 0226 12/23/14 0650 12/24/14 0423  NA 139  --  140 138 140  K 4.2  --  3.9 4.2 3.5  CL 99  --  109 108 105  CO2 25  --  25 27 27   GLUCOSE 109*  --  86 104* 105*  BUN 8  --  9 10 7   CREATININE 0.76  --  0.94 0.86 0.82  CALCIUM 8.4  --  7.3* 7.5* 8.3*  MG  --  1.9  --   --   --    Liver Function Tests:  Recent Labs Lab 12/22/14 1411 12/23/14 0650 12/24/14 0423  AST 84* 70* 62*  ALT 57* 46 51  ALKPHOS 46 33* 44  BILITOT 1.3* 1.7* 2.4*  PROT 7.2 5.1* 5.6*  ALBUMIN 4.5 3.1* 3.4*   No results for input(s): LIPASE, AMYLASE in the last 168 hours.  Recent Labs Lab 12/23/14 0226  AMMONIA 21   CBC:  Recent Labs Lab 12/22/14 1411 12/23/14 0650  WBC 9.8 6.6  NEUTROABS 8.4*  --   HGB 17.3* 13.0  HCT 47.8 37.5*  MCV 84.9 87.0  PLT 286 171   Cardiac Enzymes:  Recent Labs Lab 12/23/14 0226  CKTOTAL 201  TROPONINI <0.03   BNP (last 3 results) No results for input(s): BNP in the last 8760 hours.  ProBNP (last 3 results) No results for input(s): PROBNP in the last 8760 hours.  CBG:  Recent Labs Lab 12/23/14 1314 12/23/14 1604 12/24/14 0024 12/24/14 0439  12/24/14 0758  GLUCAP 123* 118* 108* 96 105*    Recent Results (from the past 240 hour(s))  Blood culture (routine x 2)     Status: None (Preliminary result)   Collection Time: 12/22/14 11:25 PM  Result Value Ref Range Status   Specimen Description BLOOD LEFT ANTECUBITAL  Final   Special Requests BOTTLES DRAWN AEROBIC AND ANAEROBIC 5CC  Final   Culture   Final           BLOOD CULTURE RECEIVED NO GROWTH TO DATE CULTURE WILL BE HELD FOR 5 DAYS BEFORE ISSUING A FINAL NEGATIVE REPORT Performed at [facility]    Report  Status PENDING  Incomplete  Blood culture (routine x 2)     Status: None (Preliminary result)   Collection Time: 12/22/14 11:25 PM  Result Value Ref Range Status   Specimen Description BLOOD LEFT HAND  Final   Special Requests BOTTLES DRAWN AEROBIC AND ANAEROBIC 5CC  Final   Culture   Final           BLOOD CULTURE RECEIVED NO GROWTH TO DATE CULTURE WILL BE HELD FOR 5 DAYS BEFORE ISSUING A FINAL NEGATIVE REPORT Performed at [facility]    Report Status PENDING  Incomplete  MRSA PCR Screening     Status: None   Collection Time: 12/23/14  4:20 AM  Result Value Ref Range Status   MRSA by PCR NEGATIVE NEGATIVE Final    Comment:        The GeneXpert MRSA Assay (FDA approved for NASAL specimens only), is one component of a comprehensive MRSA colonization surveillance program. It is not intended to diagnose MRSA infection nor to guide or monitor treatment for MRSA infections.   Rapid strep screen     Status: None   Collection Time: 12/23/14  4:31 PM  Result Value Ref Range Status   Streptococcus, Group A Screen (Direct) NEGATIVE NEGATIVE Final    Comment: (NOTE) A Rapid Antigen test may result negative if the antigen level in the sample is below the detection level of this test. The FDA has not cleared this test as a stand-alone test therefore the rapid antigen negative result has reflexed to a Group A Strep culture.      Studies: Ct Head Wo  Contrast  12/23/2014   CLINICAL DATA:  Acute onset of fever.  Lethargy.  Initial encounter.  EXAM: CT HEAD WITHOUT CONTRAST  TECHNIQUE: Contiguous axial images were obtained from the base of the skull through the vertex without intravenous contrast.  COMPARISON:  None.  FINDINGS: There is no evidence of acute infarction, mass lesion, or intra- or extra-axial hemorrhage on CT.  The posterior fossa, including the cerebellum, brainstem and fourth ventricle, is within normal limits. The third and lateral ventricles, and basal ganglia are unremarkable in appearance. The cerebral hemispheres are symmetric in appearance, with normal gray-white differentiation. No mass effect or midline shift is seen.  There is no evidence of fracture; visualized osseous structures are unremarkable in appearance. The visualized portions of the orbits are within normal limits. Mucosal thickening is noted at the right maxillary sinus. The remaining paranasal sinuses and mastoid air cells are well-aerated. No significant soft tissue abnormalities are seen.  IMPRESSION: 1. No acute intracranial pathology seen on CT. 2. Mucosal thickening at the right maxillary sinus.   Electronically Signed   By: Garald Balding M.D.   On: 12/23/2014 04:16   Dg Chest Port 1 View  12/23/2014   CLINICAL DATA:  Medical clearance. Alcohol abuse. Initial encounter.  EXAM: PORTABLE CHEST - 1 VIEW  COMPARISON:  Chest radiograph performed 08/29/2014  FINDINGS: The lungs are well-aerated. Pulmonary vascularity is at the upper limits of normal. There is no evidence of focal opacification, pleural effusion or pneumothorax.  The cardiomediastinal silhouette is within normal limits. No acute osseous abnormalities are seen.  IMPRESSION: No acute cardiopulmonary process seen.   Electronically Signed   By: Garald Balding M.D.   On: 12/23/2014 00:40    Scheduled Meds:  Scheduled Meds: . enoxaparin (LOVENOX) injection  40 mg Subcutaneous Q24H  . folic acid  1 mg Oral  Daily  . gabapentin  300 mg Oral QHS  . lamoTRIgine  50 mg Oral BID  . LORazepam  0-4 mg Intravenous Q6H   Followed by  . [START ON 12/25/2014] LORazepam  0-4 mg Intravenous Q12H  . multivitamin with minerals  1 tablet Oral Daily  . PARoxetine  60 mg Oral Daily  . QUEtiapine  100 mg Oral QHS  . thiamine  100 mg Oral Daily   Or  . thiamine  100 mg Intravenous Daily   Continuous Infusions:    Time spent on care of this patient: 35 minutes   [place], MD 12/24/2014, 9:15 AM  LOS: 1 day   [facility] Office  [phone] Pager - Text Page per [email or web address]  If 7PM-7AM, please contact night-coverage [email or web address]

## 2014-12-25 DIAGNOSIS — F10231 Alcohol dependence with withdrawal delirium: Secondary | ICD-10-CM

## 2014-12-25 DIAGNOSIS — F10121 Alcohol abuse with intoxication delirium: Secondary | ICD-10-CM

## 2014-12-25 DIAGNOSIS — F319 Bipolar disorder, unspecified: Secondary | ICD-10-CM

## 2014-12-25 LAB — COMPREHENSIVE METABOLIC PANEL
ALBUMIN: 3.8 g/dL (ref 3.5–5.2)
ALK PHOS: 50 U/L (ref 39–117)
ALT: 62 U/L — ABNORMAL HIGH (ref 0–53)
AST: 62 U/L — AB (ref 0–37)
Anion gap: 7 (ref 5–15)
BILIRUBIN TOTAL: 1.1 mg/dL (ref 0.3–1.2)
BUN: 8 mg/dL (ref 6–23)
CO2: 26 mmol/L (ref 19–32)
Calcium: 8.9 mg/dL (ref 8.4–10.5)
Chloride: 107 mmol/L (ref 96–112)
Creatinine, Ser: 0.76 mg/dL (ref 0.50–1.35)
GFR calc Af Amer: >90 mL/min (ref >90)
Glucose, Bld: 107 mg/dL — ABNORMAL HIGH (ref 70–99)
POTASSIUM: 3.6 mmol/L (ref 3.5–5.1)
Sodium: 140 mmol/L (ref 135–145)
Total Protein: 6.3 g/dL (ref 6.0–8.3)

## 2014-12-25 LAB — CULTURE, GROUP A STREP: Strep A Culture: NEGATIVE

## 2014-12-25 MED ORDER — QUETIAPINE FUMARATE 100 MG PO TABS: 100.0000 mg | ORAL_TABLET | Freq: Every day | ORAL | Status: AC

## 2014-12-25 MED ORDER — GABAPENTIN 300 MG PO CAPS: 300.0000 mg | ORAL_CAPSULE | Freq: Every day | ORAL | Status: AC

## 2014-12-25 MED ORDER — HYDROXYZINE HCL 25 MG PO TABS: 25.0000 mg | ORAL_TABLET | Freq: Four times a day (QID) | ORAL | Status: AC | PRN

## 2014-12-25 MED ORDER — PAROXETINE HCL 30 MG PO TABS: 60.0000 mg | ORAL_TABLET | Freq: Every day | ORAL | Status: AC

## 2014-12-25 MED ORDER — LAMOTRIGINE 25 MG PO TABS: 50.0000 mg | ORAL_TABLET | Freq: Two times a day (BID) | ORAL | Status: AC

## 2014-12-25 NOTE — Progress Notes (Signed)
Patient given discharge instructions, and verbalized an understanding of all discharge instructions.  Patient agrees with discharge plan, and is being discharged in stable medical condition.  Patient assisted to front door.

## 2014-12-25 NOTE — Discharge Summary (Addendum)
Physician Discharge Summary  Chad Mclaughlin WUJ:[id number] DOB: 1981-01-23 DOA: 12/22/2014  PCP: No PCP Per Patient  Admit date: 12/22/2014 Discharge date: 12/25/2014  Time spent: 50 minutes  Recommendations for Outpatient Follow-up:  1. Follow up with psychiatrist as outpatient  Discharge Condition: Stable Diet recommendation: Heart healthy low-sodium  Discharge Diagnoses:  Principal Problem:   SIRS (systemic inflammatory response syndrome) Active Problems:   Alcohol withdrawal   Acute encephalopathy   Alcohol intoxication   Bipolar disorder   History of present illness:  Chad Mclaughlin is a 34 y.o. male with a history of alcohol abuse who got drunk and expressed suicidal ideation and therefore was brought to the ER by his cousin. He was accepted by behavioral health but became tachycardic, febrile and developed an elevated lactic acid and therefore was referred for admission. He subsequently became less responsive after receiving Seroquel 100 mg, Robaxin and Ativan in the ER and was admitted to the SDU.  Hospital Course:  Principal Problem:  SIRS (systemic inflammatory response syndrome)- fever of 100.3, tachycardia and lactic acidosis on admission -no symptoms of infection other than sore throat on 4/25- rapid strep negative- sore throat resolved by the following day -CT of the head reveals mucosal thickening in the right maxillary sinus but he has no symptoms of acute sinusitis-no other signs of infection -He was started on vancomycin and Zosyn when he was admitted-blood cultures negative-lactic acid normalized, UA negative and chest x-ray negative -Discontinued antibiotics on 4/26-no recurrence of symptoms-will hold off on giving him further antibiotics-is advised to monitor for symptoms of infection at home and follow-up as outpatient if he is to develop any  Active Problems: Bipolar disorder -Continue home medications which include Neurontin, hydroxyzine, Lamictal, Paxil,  Seroquel -I have spoken with his mother who states that the patient has been extremely depressed and she is highly suspicious that he may still have suicidal intentions although he declines to say so-she requested a psychiatric consult to see if he needed inpatient treatment-he was seen by our psychiatrist who determined that he can continue outpatient treatment-he continues to expressed no desire to commit suicide-he states he will adhere to his home medications and states that he will be seeing his father psychiatrist once he leaves the hospital-he will be living with his parents for emotional support  Elevated LFTs - mild elevation likely secondary to alcohol use- see labs below   Alcohol withdrawal- alcohol abuse - released from [facility] about 1 wk ago -He is no longer in withdrawal and is stable for discharge -He has been given resources to help him control his drinking problem   Acute encephalopathy/ alcohol intoxication with delirium -due to alcohol abuse and resolved now  Consultations:  Psychiatry   Discharge Exam: Filed Weights   12/22/14 2333  Weight: 67.1 kg (147 lb 14.9 oz)   Filed Vitals:   12/25/14 0557  BP: 124/79  Pulse: 68  Temp: 98.1 F (36.7 C)  Resp: 18    General: AAO x 3, no distress Cardiovascular: RRR, no murmurs  Respiratory: clear to auscultation bilaterally GI: soft, non-tender, non-distended, bowel sound positive  Discharge Instructions You were cared for by a hospitalist during your hospital stay. If you have any questions about your discharge medications or the care you received while you were in the hospital after you are discharged, you can call the unit and asked to speak with the hospitalist on call if the hospitalist that took care of you is not available. Once you  are discharged, your primary care physician will handle any further medical issues. Please note that NO REFILLS for any discharge medications will be authorized once you  are discharged, as it is imperative that you return to your primary care physician (or establish a relationship with a primary care physician if you do not have one) for your aftercare needs so that they can reassess your need for medications and monitor your lab values.      Discharge Instructions    Diet - low sodium heart healthy    Complete by:  As directed      Increase activity slowly    Complete by:  As directed             Medication List    TAKE these medications        gabapentin 300 MG capsule  Commonly known as:  NEURONTIN  Take 1 capsule (300 mg total) by mouth at bedtime.     hydrOXYzine 25 MG tablet  Commonly known as:  ATARAX/VISTARIL  Take 1 tablet (25 mg total) by mouth every 6 (six) hours as needed for anxiety.     lamoTRIgine 25 MG tablet  Commonly known as:  LAMICTAL  Take 2 tablets (50 mg total) by mouth 2 (two) times daily. For mood stabilization     PARoxetine 30 MG tablet  Commonly known as:  PAXIL  Take 2 tablets (60 mg total) by mouth daily. For depression     QUEtiapine 100 MG tablet  Commonly known as:  SEROQUEL  Take 1 tablet (100 mg total) by mouth at bedtime. For mood stabilization       Allergies  Allergen Reactions  . Shellfish Allergy Anaphylaxis      The results of significant diagnostics from this hospitalization (including imaging, microbiology, ancillary and laboratory) are listed below for reference.    Significant Diagnostic Studies: Ct Head Wo Contrast  12/23/2014   CLINICAL DATA:  Acute onset of fever.  Lethargy.  Initial encounter.  EXAM: CT HEAD WITHOUT CONTRAST  TECHNIQUE: Contiguous axial images were obtained from the base of the skull through the vertex without intravenous contrast.  COMPARISON:  None.  FINDINGS: There is no evidence of acute infarction, mass lesion, or intra- or extra-axial hemorrhage on CT.  The posterior fossa, including the cerebellum, brainstem and fourth ventricle, is within normal limits. The  third and lateral ventricles, and basal ganglia are unremarkable in appearance. The cerebral hemispheres are symmetric in appearance, with normal gray-white differentiation. No mass effect or midline shift is seen.  There is no evidence of fracture; visualized osseous structures are unremarkable in appearance. The visualized portions of the orbits are within normal limits. Mucosal thickening is noted at the right maxillary sinus. The remaining paranasal sinuses and mastoid air cells are well-aerated. No significant soft tissue abnormalities are seen.  IMPRESSION: 1. No acute intracranial pathology seen on CT. 2. Mucosal thickening at the right maxillary sinus.   Electronically Signed   By: Garald Balding M.D.   On: 12/23/2014 04:16   Dg Chest Port 1 View  12/23/2014   CLINICAL DATA:  Medical clearance. Alcohol abuse. Initial encounter.  EXAM: PORTABLE CHEST - 1 VIEW  COMPARISON:  Chest radiograph performed 08/29/2014  FINDINGS: The lungs are well-aerated. Pulmonary vascularity is at the upper limits of normal. There is no evidence of focal opacification, pleural effusion or pneumothorax.  The cardiomediastinal silhouette is within normal limits. No acute osseous abnormalities are seen.  IMPRESSION: No acute cardiopulmonary process  seen.   Electronically Signed   By: Garald Balding M.D.   On: 12/23/2014 00:40    Microbiology: Recent Results (from the past 240 hour(s))  Blood culture (routine x 2)     Status: None (Preliminary result)   Collection Time: 12/22/14 11:25 PM  Result Value Ref Range Status   Specimen Description BLOOD LEFT ANTECUBITAL  Final   Special Requests BOTTLES DRAWN AEROBIC AND ANAEROBIC 5CC  Final   Culture   Final           BLOOD CULTURE RECEIVED NO GROWTH TO DATE CULTURE WILL BE HELD FOR 5 DAYS BEFORE ISSUING A FINAL NEGATIVE REPORT Performed at [facility]    Report Status PENDING  Incomplete  Blood culture (routine x 2)     Status: None (Preliminary result)    Collection Time: 12/22/14 11:25 PM  Result Value Ref Range Status   Specimen Description BLOOD LEFT HAND  Final   Special Requests BOTTLES DRAWN AEROBIC AND ANAEROBIC 5CC  Final   Culture   Final           BLOOD CULTURE RECEIVED NO GROWTH TO DATE CULTURE WILL BE HELD FOR 5 DAYS BEFORE ISSUING A FINAL NEGATIVE REPORT Performed at [facility]    Report Status PENDING  Incomplete  Urine culture     Status: None   Collection Time: 12/23/14 12:38 AM  Result Value Ref Range Status   Specimen Description URINE, RANDOM  Final   Special Requests NONE  Final   Colony Count NO GROWTH Performed at [facility]   Final   Culture NO GROWTH Performed at [facility]   Final   Report Status 12/24/2014 FINAL  Final  MRSA PCR Screening     Status: None   Collection Time: 12/23/14  4:20 AM  Result Value Ref Range Status   MRSA by PCR NEGATIVE NEGATIVE Final    Comment:        The GeneXpert MRSA Assay (FDA approved for NASAL specimens only), is one component of a comprehensive MRSA colonization surveillance program. It is not intended to diagnose MRSA infection nor to guide or monitor treatment for MRSA infections.   Rapid strep screen     Status: None   Collection Time: 12/23/14  4:31 PM  Result Value Ref Range Status   Streptococcus, Group A Screen (Direct) NEGATIVE NEGATIVE Final    Comment: (NOTE) A Rapid Antigen test may result negative if the antigen level in the sample is below the detection level of this test. The FDA has not cleared this test as a stand-alone test therefore the rapid antigen negative result has reflexed to a Group A Strep culture.   Culture, Group A Strep     Status: None   Collection Time: 12/23/14  4:31 PM  Result Value Ref Range Status   Strep A Culture Negative  Final    Comment: (NOTE) Performed At: [facility] [place], [place] [id number] Lindon Romp MD PI:[id number]      Labs: Basic Metabolic  Panel:  Recent Labs Lab 12/22/14 1411 12/22/14 2306 12/23/14 0226 12/23/14 0650 12/24/14 0423 12/25/14 0600  NA 139  --  140 138 140 140  K 4.2  --  3.9 4.2 3.5 3.6  CL 99  --  109 108 105 107  CO2 25  --  25 27 27 26   GLUCOSE 109*  --  86 104* 105* 107*  BUN 8  --  9  10 7 8   CREATININE 0.76  --  0.94 0.86 0.82 0.76  CALCIUM 8.4  --  7.3* 7.5* 8.3* 8.9  MG  --  1.9  --   --   --   --    Liver Function Tests:  Recent Labs Lab 12/22/14 1411 12/23/14 0650 12/24/14 0423 12/25/14 0600  AST 84* 70* 62* 62*  ALT 57* 46 51 62*  ALKPHOS 46 33* 44 50  BILITOT 1.3* 1.7* 2.4* 1.1  PROT 7.2 5.1* 5.6* 6.3  ALBUMIN 4.5 3.1* 3.4* 3.8   No results for input(s): LIPASE, AMYLASE in the last 168 hours.  Recent Labs Lab 12/23/14 0226  AMMONIA 21   CBC:  Recent Labs Lab 12/22/14 1411 12/23/14 0650  WBC 9.8 6.6  NEUTROABS 8.4*  --   HGB 17.3* 13.0  HCT 47.8 37.5*  MCV 84.9 87.0  PLT 286 171   Cardiac Enzymes:  Recent Labs Lab 12/23/14 0226  CKTOTAL 201  TROPONINI <0.03   BNP: BNP (last 3 results) No results for input(s): BNP in the last 8760 hours.  ProBNP (last 3 results) No results for input(s): PROBNP in the last 8760 hours.  CBG:  Recent Labs Lab 12/23/14 1604 12/23/14 2038 12/24/14 0024 12/24/14 0439 12/24/14 0758  GLUCAP 118* 99 108* 96 105*       SignedDebbe Odea, MD [facility] 12/25/2014, 9:17 AM

## 2014-12-25 NOTE — Progress Notes (Signed)
CSW met with Pt and his mother who was present in the room. CSW provided Pt with outpatient substance abuse resources in [place], [place] and [place], [place] where Pt's parents live and Pt plans to stay intermittently. Pt and mother appreciative of the resources and report that Pt plans to follow-up with a psychiatrist in the area and receive substance abuse treatment.   CSW signing off but available if needs arise.  Peri Maris, [place] 12/25/2014 10:51 AM [phone]

## 2014-12-29 LAB — CULTURE, BLOOD (ROUTINE X 2)
CULTURE: NO GROWTH
Culture: NO GROWTH

## 2016-05-17 IMAGING — CR DG CHEST 2V
2 series · 2 of 2 positions shown · non-contrast
Comparison: None.

CLINICAL DATA: Positive PPD.

EXAM:
CHEST  2 VIEW

[w chest pa]
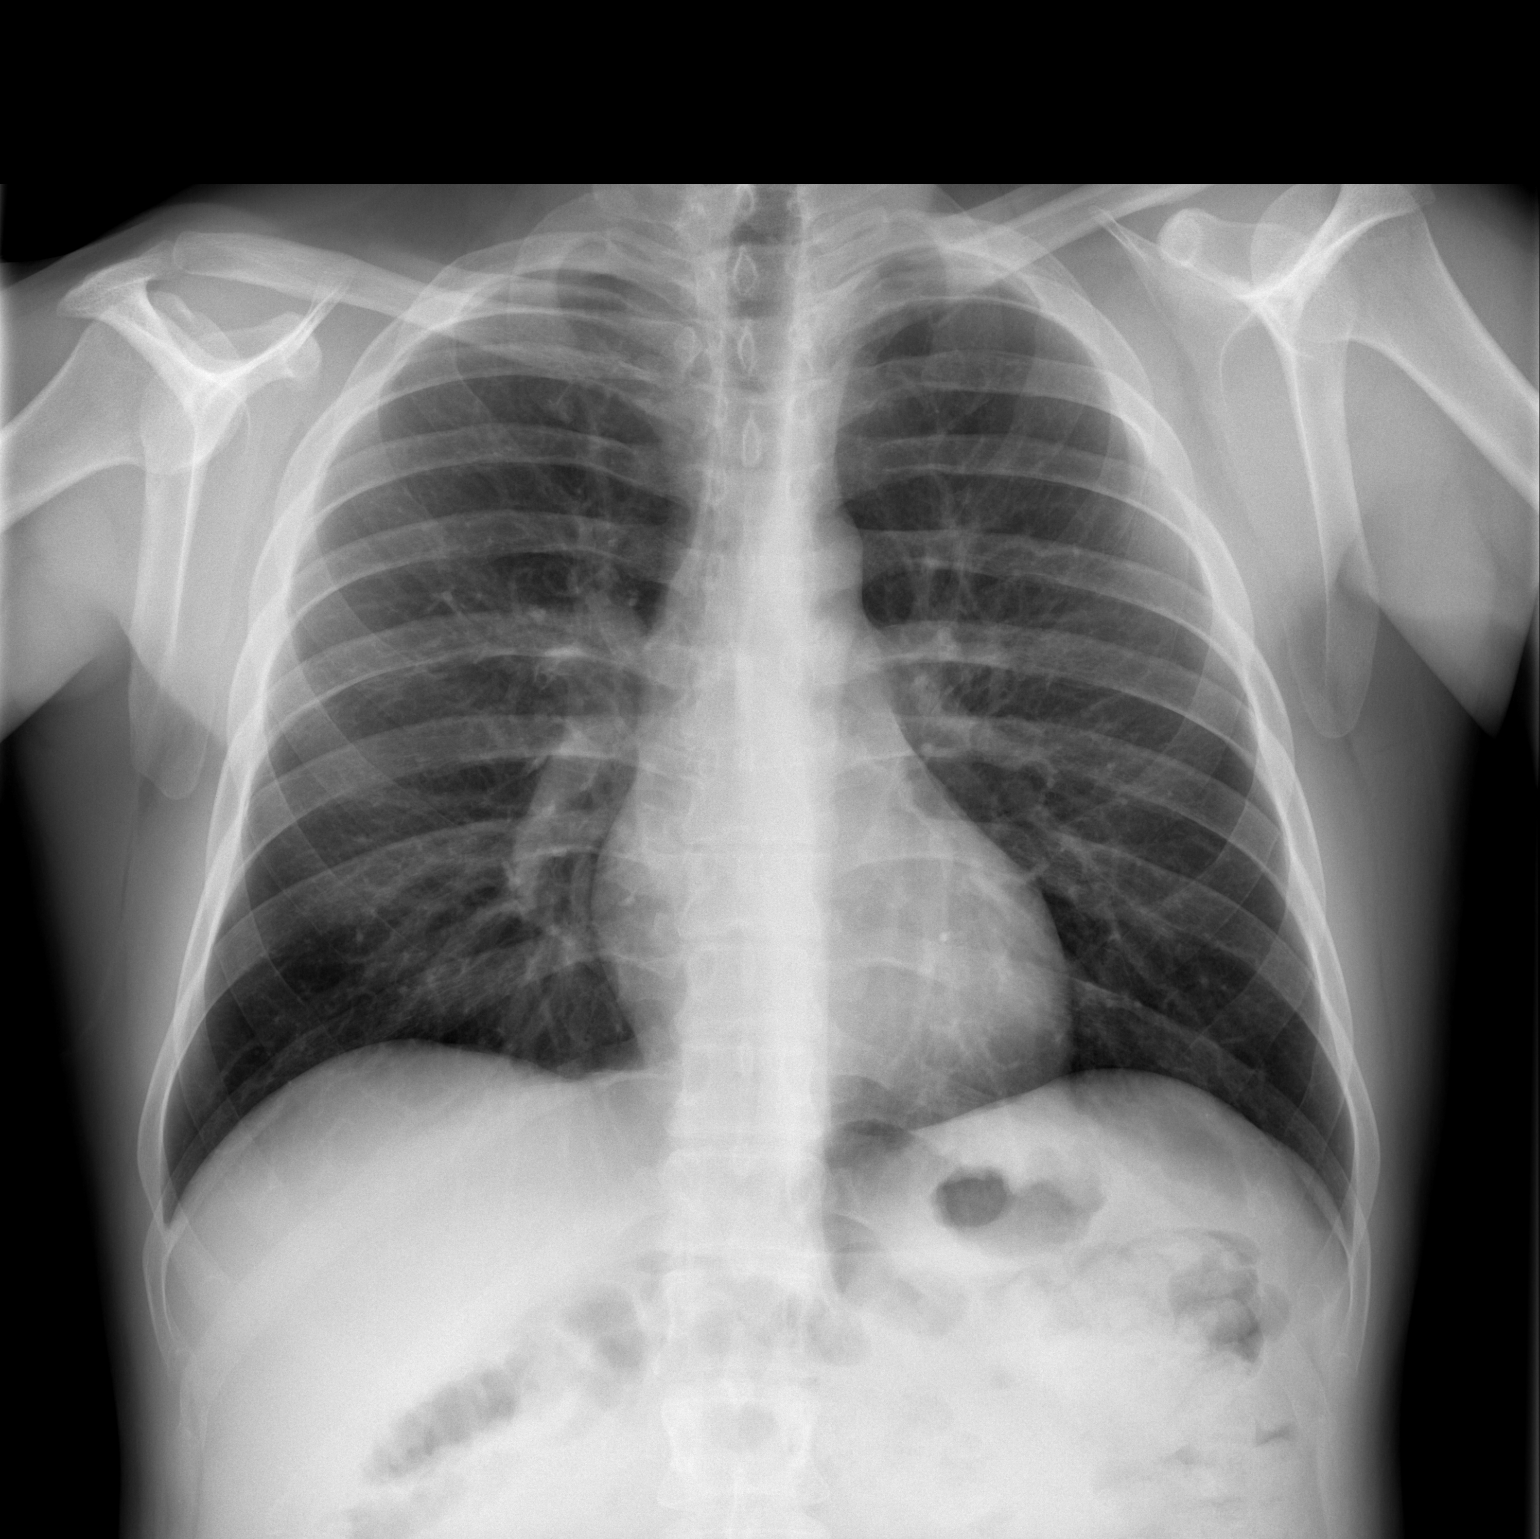

[w chest lat]
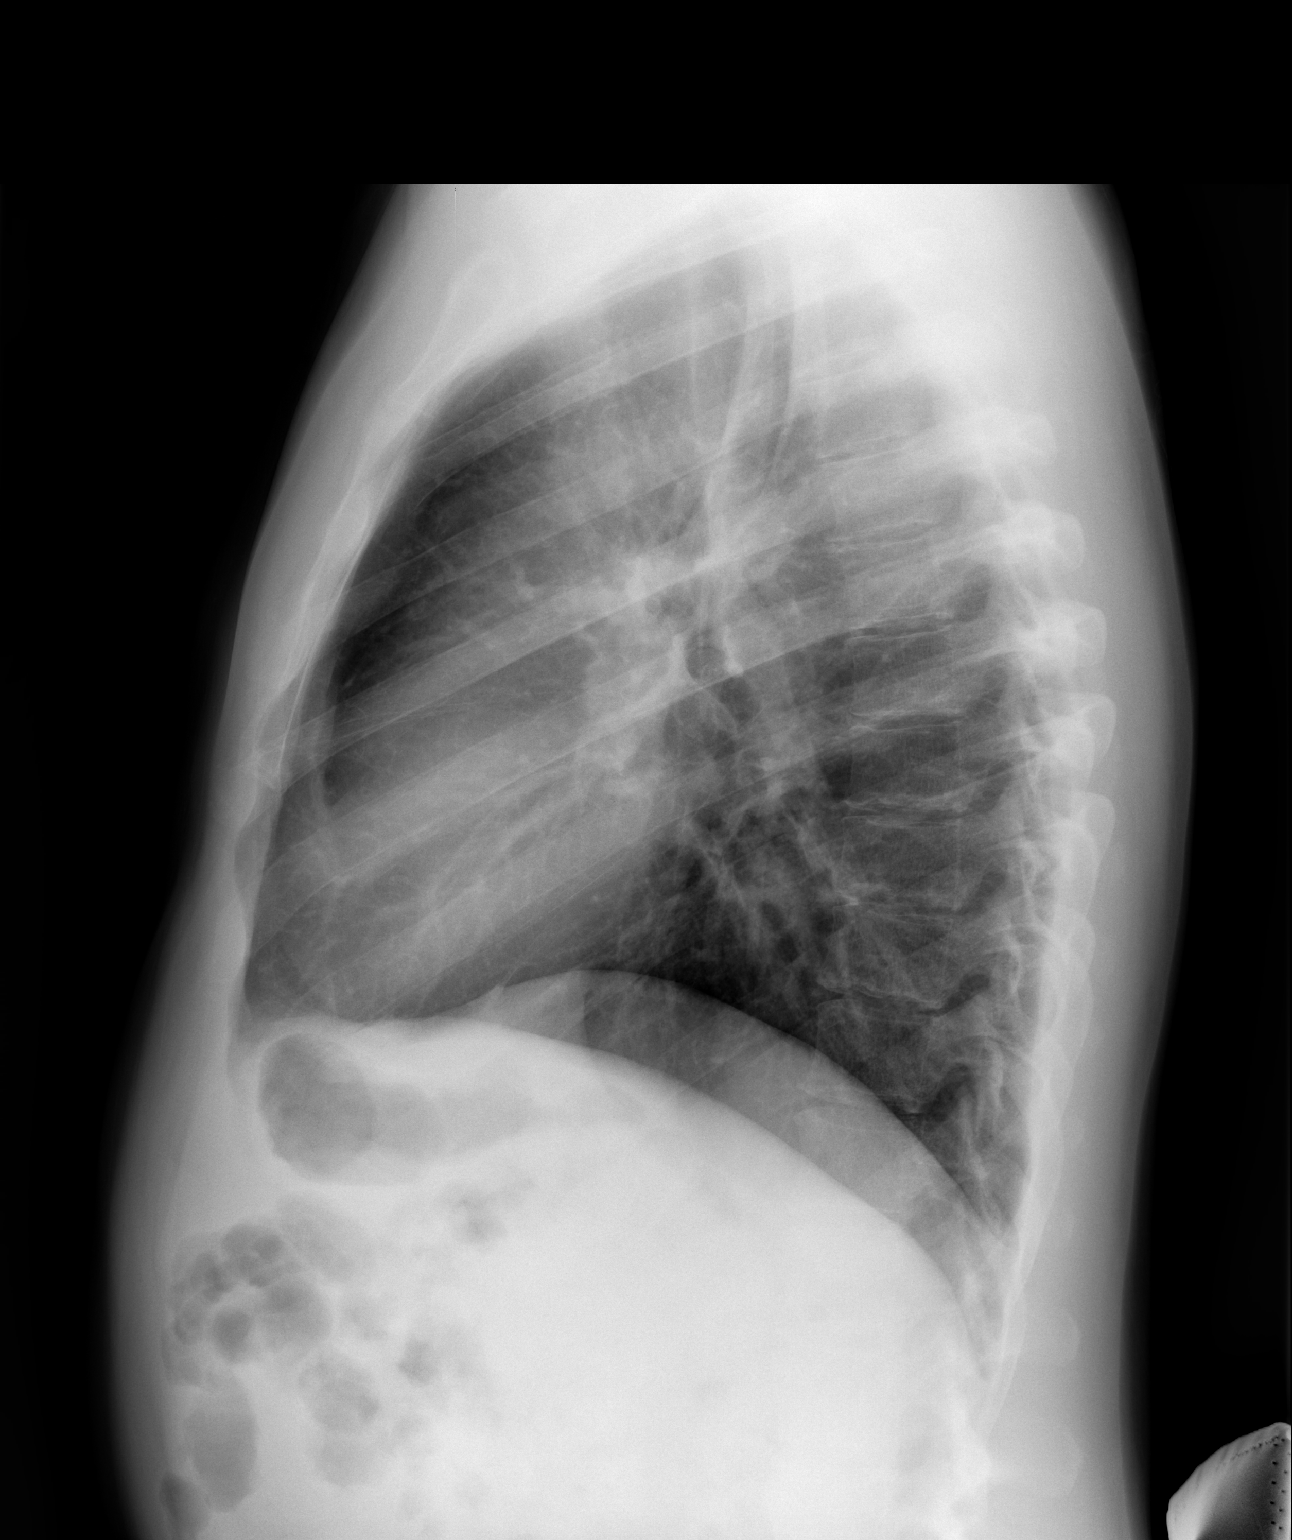

[2 of 2 positions shown; findings below may reference images not displayed]

FINDINGS: Mediastinum and hilar structures normal. Apical pulmonary nodule
right upper lobe. Apical lordotic chest x-ray suggested for further
evaluation. The lungs are otherwise clear. Heart size normal. No
pleural effusion or pneumothorax.
IMPRESSION: Questionable pulmonary nodule right upper lobe. Apical lordotic
chest x-ray suggest for further evaluation.

## 2016-05-18 IMAGING — CR DG CHEST 1V
1 series · 1 of 1 positions shown · non-contrast
Comparison: Chest radiograph August 28, 2014

CLINICAL DATA: Questionable nodular opacity right upper lobe on
recent chest radiograph. Positive tuberculin skin test

EXAM:
CHEST - 1 VIEW

[w chest apical]
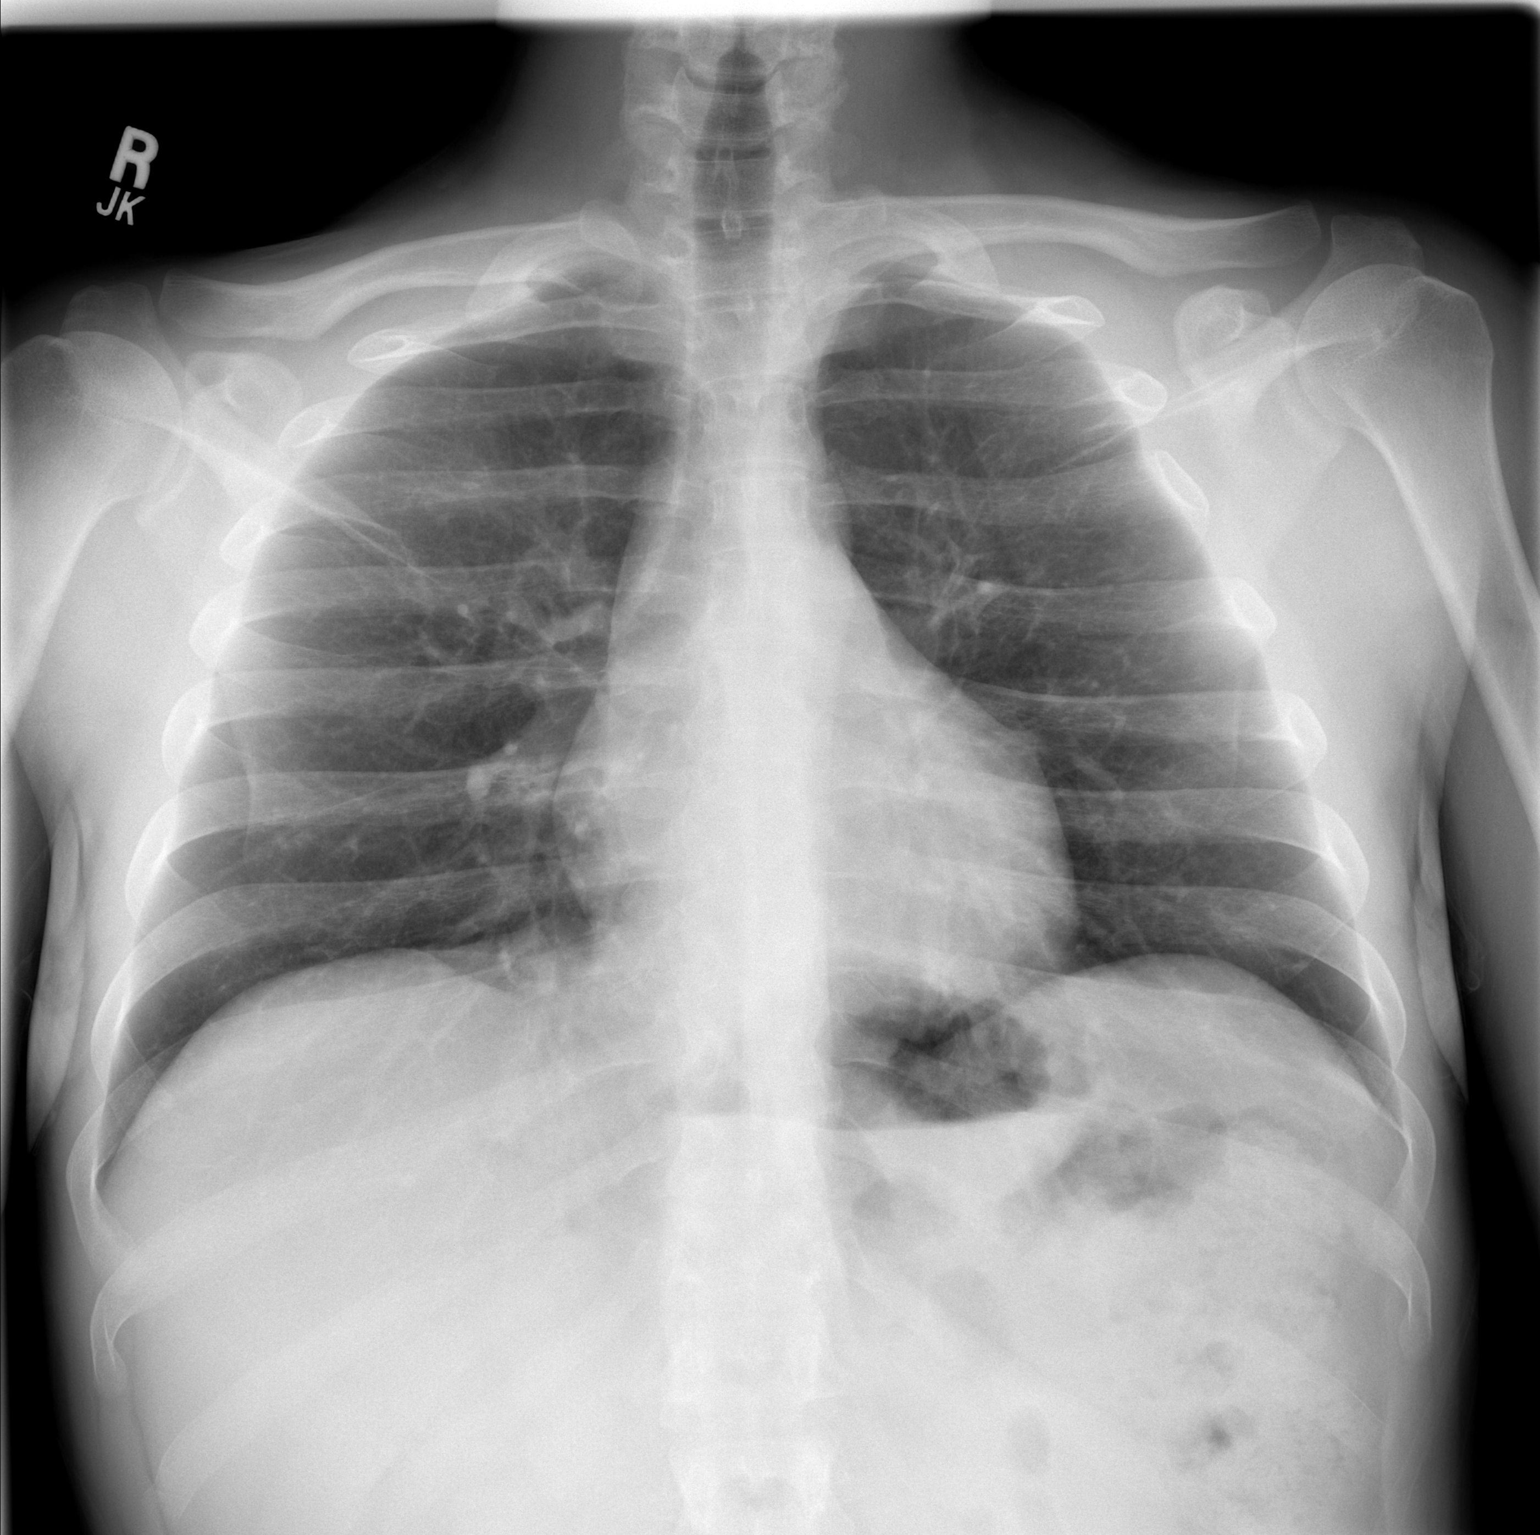

[1 of 1 positions shown; findings below may reference images not displayed]

FINDINGS: Apical lordotic image obtained. No parenchymal nodular opacity
appreciable. Lungs clear. Heart size and pulmonary vascularity are
normal. No adenopathy. No bone lesions.
IMPRESSION: Lungs clear. The questionable nodular opacity seen on recent frontal
chest radiograph felt to represent small exostosis arising from the
anterior right first rib.

## 2016-09-10 IMAGING — CR DG CHEST 1V PORT
1 series · 1 of 1 positions shown · non-contrast
Comparison: Chest radiograph performed 08/29/2014

CLINICAL DATA: Medical clearance. Alcohol abuse. Initial encounter.

EXAM:
PORTABLE CHEST - 1 VIEW

[AP]
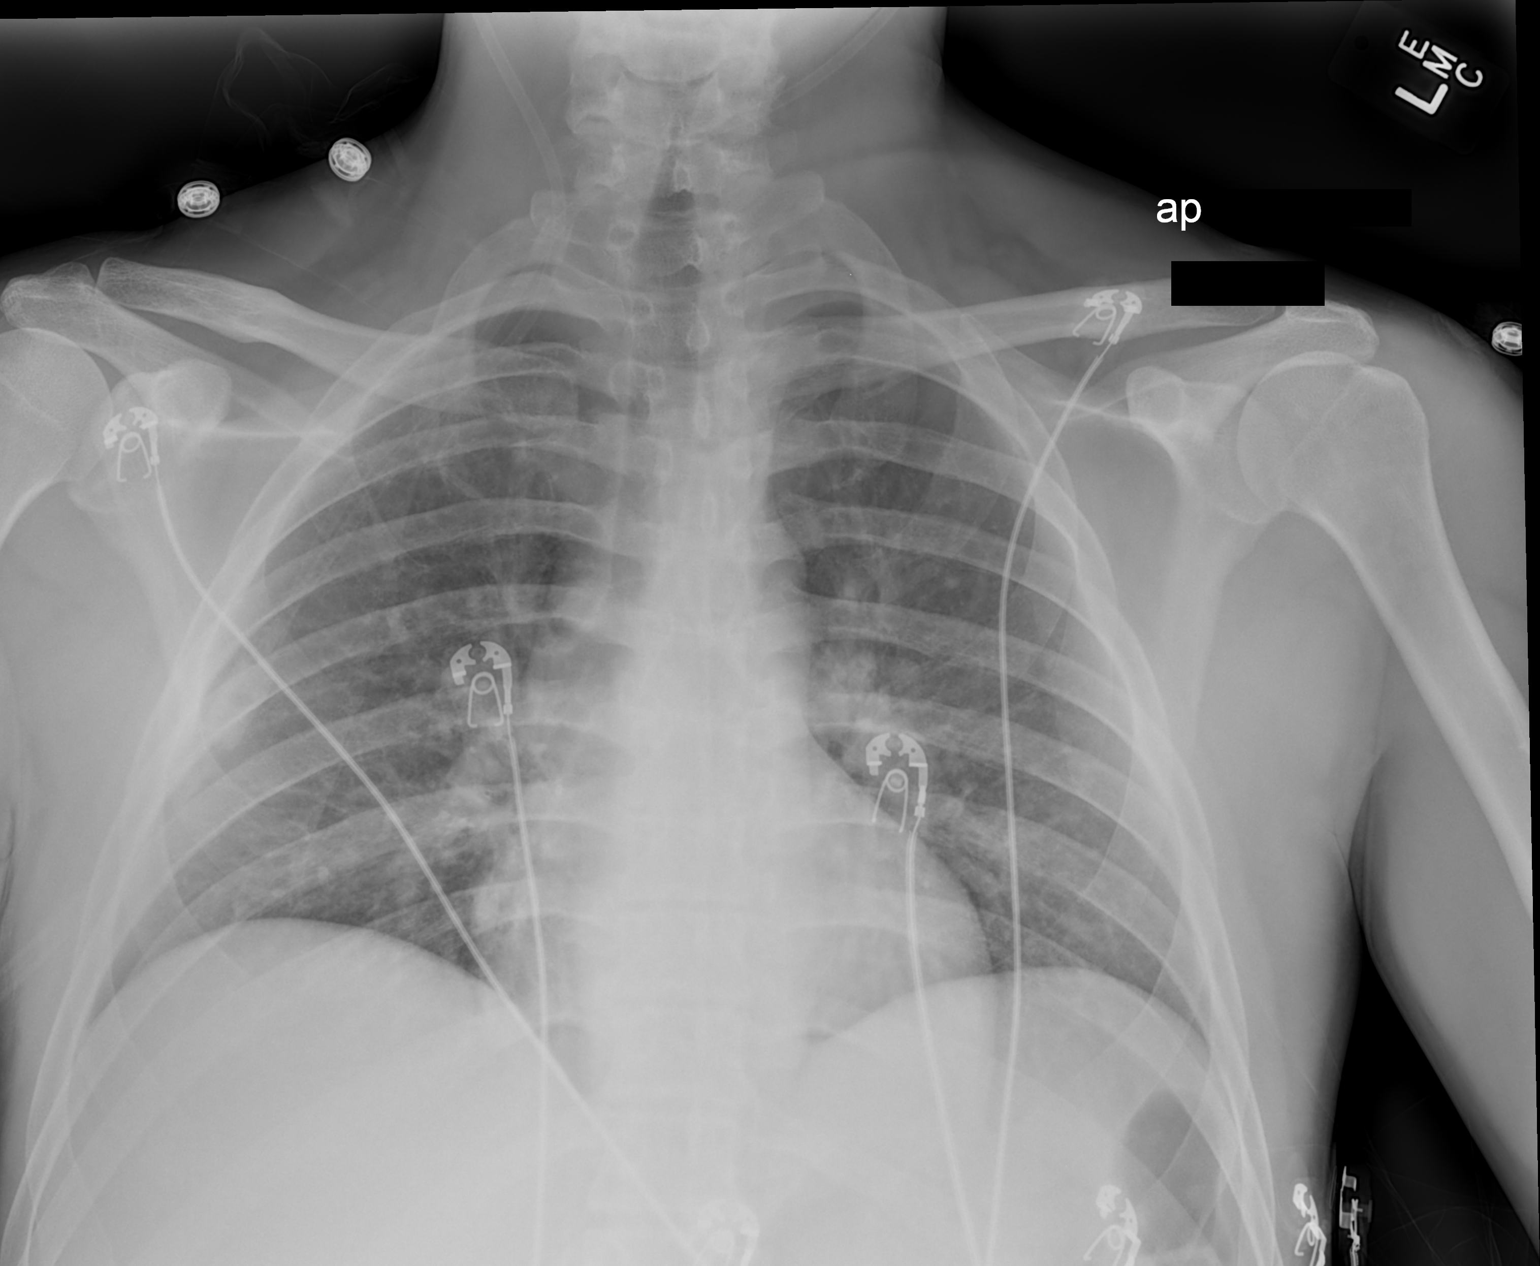

[1 of 1 positions shown; findings below may reference images not displayed]

FINDINGS: The lungs are well-aerated. Pulmonary vascularity is at the upper
limits of normal. There is no evidence of focal opacification,
pleural effusion or pneumothorax.

The cardiomediastinal silhouette is within normal limits. No acute
osseous abnormalities are seen.
IMPRESSION: No acute cardiopulmonary process seen.
